# Patient Record
Sex: Female | Born: 1975
Health system: Southern US, Community
[De-identification: ages and names within clinical notes are randomized; demographics above are authoritative.]

## PROBLEM LIST (undated history)

## (undated) DIAGNOSIS — D649 Anemia, unspecified: Secondary | ICD-10-CM

## (undated) DIAGNOSIS — M349 Systemic sclerosis, unspecified: Secondary | ICD-10-CM

## (undated) HISTORY — PX: WISDOM TOOTH EXTRACTION: SHX21

## (undated) HISTORY — PX: BREAST BIOPSY: SHX20

## (undated) HISTORY — DX: Anemia, unspecified: D64.9

---
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
## 2000-06-10 ENCOUNTER — Emergency Department (HOSPITAL_COMMUNITY): Admission: EM | Admit: 2000-06-10 | Discharge: 2000-06-10 | Payer: Self-pay | Admitting: Emergency Medicine

## 2002-12-09 ENCOUNTER — Encounter: Admission: RE | Admit: 2002-12-09 | Discharge: 2002-12-09 | Payer: Self-pay | Admitting: Family Medicine

## 2003-03-01 ENCOUNTER — Other Ambulatory Visit: Admission: RE | Admit: 2003-03-01 | Discharge: 2003-03-01 | Payer: Self-pay | Admitting: Obstetrics & Gynecology

## 2003-03-01 ENCOUNTER — Other Ambulatory Visit: Admission: RE | Admit: 2003-03-01 | Discharge: 2003-03-01 | Payer: Self-pay | Admitting: Obstetrics and Gynecology

## 2004-02-10 ENCOUNTER — Other Ambulatory Visit: Admission: RE | Admit: 2004-02-10 | Discharge: 2004-02-10 | Payer: Self-pay | Admitting: Obstetrics and Gynecology

## 2004-05-23 ENCOUNTER — Encounter: Admission: RE | Admit: 2004-05-23 | Discharge: 2004-07-07 | Payer: Self-pay | Admitting: Internal Medicine

## 2004-07-03 ENCOUNTER — Inpatient Hospital Stay (HOSPITAL_COMMUNITY): Admission: AD | Admit: 2004-07-03 | Discharge: 2004-07-03 | Payer: Self-pay | Admitting: Obstetrics and Gynecology

## 2004-07-07 ENCOUNTER — Ambulatory Visit: Payer: Self-pay | Admitting: Neonatology

## 2004-07-07 ENCOUNTER — Inpatient Hospital Stay (HOSPITAL_COMMUNITY): Admission: AD | Admit: 2004-07-07 | Discharge: 2004-07-16 | Payer: Self-pay | Admitting: Obstetrics and Gynecology

## 2004-07-12 ENCOUNTER — Encounter (INDEPENDENT_AMBULATORY_CARE_PROVIDER_SITE_OTHER): Payer: Self-pay | Admitting: *Deleted

## 2004-07-17 ENCOUNTER — Encounter: Admission: RE | Admit: 2004-07-17 | Discharge: 2004-08-14 | Payer: Self-pay | Admitting: Obstetrics and Gynecology

## 2004-08-03 ENCOUNTER — Encounter: Admission: RE | Admit: 2004-08-03 | Discharge: 2004-08-03 | Payer: Self-pay | Admitting: Internal Medicine

## 2004-08-10 ENCOUNTER — Ambulatory Visit (HOSPITAL_COMMUNITY): Admission: RE | Admit: 2004-08-10 | Discharge: 2004-08-10 | Payer: Self-pay | Admitting: Internal Medicine

## 2004-08-21 ENCOUNTER — Ambulatory Visit (HOSPITAL_COMMUNITY): Admission: RE | Admit: 2004-08-21 | Discharge: 2004-08-21 | Payer: Self-pay | Admitting: Internal Medicine

## 2004-09-05 ENCOUNTER — Encounter: Admission: RE | Admit: 2004-09-05 | Discharge: 2004-12-04 | Payer: Self-pay

## 2004-12-05 ENCOUNTER — Encounter: Admission: RE | Admit: 2004-12-05 | Discharge: 2005-03-05 | Payer: Self-pay

## 2005-03-07 ENCOUNTER — Encounter: Admission: RE | Admit: 2005-03-07 | Discharge: 2005-05-03 | Payer: Self-pay

## 2005-03-15 ENCOUNTER — Other Ambulatory Visit: Admission: RE | Admit: 2005-03-15 | Discharge: 2005-03-15 | Payer: Self-pay | Admitting: Obstetrics and Gynecology

## 2005-05-28 ENCOUNTER — Encounter (INDEPENDENT_AMBULATORY_CARE_PROVIDER_SITE_OTHER): Payer: Self-pay | Admitting: Specialist

## 2005-05-28 ENCOUNTER — Ambulatory Visit (HOSPITAL_COMMUNITY): Admission: RE | Admit: 2005-05-28 | Discharge: 2005-05-28 | Payer: Self-pay | Admitting: Obstetrics and Gynecology

## 2005-10-10 ENCOUNTER — Encounter
Admission: RE | Admit: 2005-10-10 | Discharge: 2006-01-08 | Payer: Self-pay | Admitting: Physical Medicine and Rehabilitation

## 2006-01-22 HISTORY — PX: CYST REMOVAL HAND: SHX6279

## 2006-03-14 ENCOUNTER — Other Ambulatory Visit: Admission: RE | Admit: 2006-03-14 | Discharge: 2006-03-14 | Payer: Self-pay | Admitting: Obstetrics and Gynecology

## 2006-09-25 ENCOUNTER — Other Ambulatory Visit: Admission: RE | Admit: 2006-09-25 | Discharge: 2006-09-25 | Payer: Self-pay | Admitting: Obstetrics and Gynecology

## 2007-03-10 ENCOUNTER — Other Ambulatory Visit: Admission: RE | Admit: 2007-03-10 | Discharge: 2007-03-10 | Payer: Self-pay | Admitting: Obstetrics and Gynecology

## 2007-09-03 ENCOUNTER — Other Ambulatory Visit: Admission: RE | Admit: 2007-09-03 | Discharge: 2007-09-03 | Payer: Self-pay | Admitting: Obstetrics and Gynecology

## 2008-07-21 ENCOUNTER — Other Ambulatory Visit: Admission: RE | Admit: 2008-07-21 | Discharge: 2008-07-21 | Payer: Self-pay | Admitting: Obstetrics and Gynecology

## 2009-09-13 ENCOUNTER — Other Ambulatory Visit: Admission: RE | Admit: 2009-09-13 | Discharge: 2009-09-13 | Payer: Self-pay | Admitting: Obstetrics and Gynecology

## 2009-12-07 ENCOUNTER — Ambulatory Visit: Payer: Self-pay | Admitting: Cardiovascular Disease

## 2009-12-08 ENCOUNTER — Ambulatory Visit: Admission: RE | Admit: 2009-12-08 | Discharge: 2009-12-08 | Payer: Self-pay | Admitting: Internal Medicine

## 2010-02-12 ENCOUNTER — Encounter: Payer: Self-pay | Admitting: Obstetrics and Gynecology

## 2010-03-08 ENCOUNTER — Other Ambulatory Visit: Payer: Self-pay | Admitting: Podiatry

## 2010-03-29 ENCOUNTER — Ambulatory Visit: Payer: Medicare Other | Attending: Internal Medicine | Admitting: Physical Therapy

## 2010-03-29 DIAGNOSIS — IMO0001 Reserved for inherently not codable concepts without codable children: Secondary | ICD-10-CM | POA: Insufficient documentation

## 2010-03-29 DIAGNOSIS — M542 Cervicalgia: Secondary | ICD-10-CM | POA: Insufficient documentation

## 2010-03-29 DIAGNOSIS — M2560 Stiffness of unspecified joint, not elsewhere classified: Secondary | ICD-10-CM | POA: Insufficient documentation

## 2010-03-29 DIAGNOSIS — R293 Abnormal posture: Secondary | ICD-10-CM | POA: Insufficient documentation

## 2010-04-04 ENCOUNTER — Ambulatory Visit: Payer: Medicare Other | Admitting: Physical Therapy

## 2010-04-06 ENCOUNTER — Ambulatory Visit: Payer: Medicare Other | Admitting: Physical Therapy

## 2010-04-10 ENCOUNTER — Ambulatory Visit: Payer: Medicare Other | Admitting: Physical Therapy

## 2010-04-12 ENCOUNTER — Encounter: Payer: Medicare Other | Admitting: Physical Therapy

## 2010-06-06 ENCOUNTER — Ambulatory Visit (HOSPITAL_COMMUNITY)
Admission: RE | Admit: 2010-06-06 | Discharge: 2010-06-06 | Disposition: A | Discharge status: Home / Self Care | Payer: Medicare Other | Source: Ambulatory Visit | Attending: Internal Medicine | Admitting: Internal Medicine

## 2010-06-06 DIAGNOSIS — J984 Other disorders of lung: Secondary | ICD-10-CM | POA: Insufficient documentation

## 2010-06-09 NOTE — H&P (Signed)
NAMEELAIN, WIXON               ACCOUNT NO.:  [id number]   MEDICAL RECORD NO.:  [id number]          PATIENT TYPE:  MAT   LOCATION:  MATC                          [facility]  [facility]   PHYSICIAN:  Charles A. Delcambre, MDDATE OF BIRTH:  Feb 02, 1975   DATE OF ADMISSION:  07/03/2004  DATE OF DISCHARGE:  07/03/2004                                HISTORY & PHYSICAL   HISTORY OF PRESENT ILLNESS:  She is a 35 year old para 0-0-1-0 with [facility]  August 22, 2004 at 33 weeks 3 days. Presented for NST, AFI to Dr.  Lesle Chris office today for antenatal testing as we do not have  ultrasound in our office today. AFI was 15 cm, NST was reactive. She was  having some contractions on the NST. Dr. Sarajane Marek checked her cervix and  she was 3 cm dilated, 50% effaced, -2 station, vertex, intact. For this  reason, she was upon my direction sent to [facility] for admission for  preterm labor. She denied ruptured membranes or bleeding. She did note some  contractions felt and notes active fetal movement.   PAST MEDICAL HISTORY:  1.  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  2.  Chronic anemia.   SURGICAL HISTORY:  None.   MEDICATIONS:  1.  Prednisone 15 mg a day.  2.  Plaquenil 200 mg b.i.d.  3.  Procardia XL 30 mg h.s.  4.  Prenatal vitamins.  5.  Iron.   ALLERGIES:  No known drug allergies.   SOCIAL HISTORY:  No tobacco, ethanol, or drug use. The patient is married.   FAMILY HISTORY:  Noncontributory.   REVIEW OF SYSTEMS:  No chest pain, shortness of breath, wheezing, diarrhea,  constipation.   PHYSICAL EXAMINATION:  GENERAL:  Alert and oriented x3.  VITAL SIGNS:  Blood pressure 128/70 last visit June 14. Weight 206 pounds.  HEENT:  Grossly within normal limits.  NECK:  Supple without thyromegaly or adenopathy.  LUNGS:  Clear bilaterally.  HEART:  Regular rate and rhythm.  ABDOMEN:  Fundal height 33. Fetal heart rate 140s.  HANDS:  Fingers are taut consistent with connective tissue disorder.  EXTREMITIES:  Minimal edema bilaterally.  PELVIC:  As noted in HPI - 3 cm, 50% effaced, -2 station, vertex intact.   ASSESSMENT:  Preterm labor, occult.   PLAN:  Admit to antenatal, group B strep, betamethasone. We will go ahead  and start antibiotics with ampicillin 2 g q.6h. pending strep outcome.  Magnesium prophylaxis at least initially before betamethasone will be given.  All questions were answered and will proceed as outlined.       CAD/MEDQ  D:  07/07/2004  T:  07/07/2004  Job:  [id number]

## 2010-06-09 NOTE — Op Note (Signed)
NAMEPERSIS, GRAFFIUS               ACCOUNT NO.:  [id number]   MEDICAL RECORD NO.:  [id number]          PATIENT TYPE:  AMB   LOCATION:  SDC                           [facility]  [facility]   PHYSICIAN:  Charles A. Delcambre, MDDATE OF BIRTH:  September 04, 1975   DATE OF PROCEDURE:  05/28/2005  DATE OF DISCHARGE:                                 OPERATIVE REPORT   PREOPERATIVE DIAGNOSIS:  High grade dysplasia of the cervix   POSTOPERATIVE DIAGNOSIS:  High grade dysplasia the cervix.   OPERATION PERFORMED:  1.  LEEP.  2.  Paracervical block.   SURGEON:  Charles A. Delcambre, MD   ASSISTANT:  None.   COMPLICATIONS:  None.   ESTIMATED BLOOD LOSS:  Less than or equal to 25 mL.   SPECIMENS:  Looped LEEP cone marked at 12 and six o'clock.   OPERATIVE FINDINGS:  With the colposcope a non-Lugol staining lesion was  noted at the cervix consistent with the preoperative diagnosis.   DESCRIPTION OF THE OPERATION:  The patient was brought to the operating room  and placed in the supine position.  Sedation was accomplished.  The patient  was then placed in the dorsal lithotomy position and Universal stirrups, and  draped for a LEEP procedure.  The colposcope was used and Lugol's was placed  on the cervix.  The lesion was isolated with colposcope.  SmokEvac smoke  evacuator was activated.  The LEEP loop was attached to the Bovie and the  cautery was set at 40 Watts, coagulation and cutting settings.  Using a cut  electrode the cone specimen was excised without difficulty.  Prior to  excision a paracervical block at four and eight o'clock had been placed with  one-quarter percent plain Marcaine infiltrated; a total of 10 mL divided  equally.  The patient had a good block affect; and, with excision the cone  was excised and handed off to go to pathology to be marked after the  procedure.  A good excision was accomplished without difficulty by using the  ball electrode and the cautery on a coagulation  setting of 40 Watts.  The  cone bed was cauterized with good hemostasis resulting.  There was very  little oozing for this reason and Monsel's solution was applied.  Hemostasis  was excellent.   All instruments were removed.   The patient was taken to the recovery room with physician in attendance  having tolerated the procedure well.   The cone bed was marked with pins at 12 and six o'clock, and appropriately  annotated on pathology sheets.      Charles A. Sydnee Cabal, MD  Electronically Signed     CAD/MEDQ  D:  05/28/2005  T:  05/29/2005  Job:  [id number]

## 2010-06-09 NOTE — Discharge Summary (Signed)
[name], Laura Rowland               ACCOUNT NO.:  [id number]   MEDICAL RECORD NO.:  [id number]          PATIENT TYPE:  INP   LOCATION:  9302                          [facility]  [facility]   PHYSICIAN:  Charles A. Delcambre, MDDATE OF BIRTH:  July 13, 1975   DATE OF ADMISSION:  07/07/2004  DATE OF DISCHARGE:  07/16/2004                                 DISCHARGE SUMMARY   PRIMARY DISCHARGE DIAGNOSES:  1.  Intrauterine pregnancy 34 weeks 1 day.  2.  Fetal intolerance of labor.  3.  Maternal connective tissue disease.   PROCEDURE:  Primary low transverse cesarean section.   DISPOSITION:  The patient discharged home to return to the office in 72  hours to discontinue staples.  She was given prescription for Percocet  5/235, 1-2 p.o. q.3-4h. p.r.n. #40, prenatal vitamins 1 p.o. daily, iron 1  tablet p.o. daily.  Convalescence at home and no driving for 2 weeks, no  lifting greater than 30 pounds per day for 1 month and notify of any fever  over 100 degrees or erythema about the incision or drainage from the  incision, heavy bleeding, or increased pain.   LABORATORY DATA:  Postoperative hemoglobin 10.7, hematocrit 31.8.   OPERATIVE FINDINGS:  A vigorous female, Apgars 7 and 9, 1517 g, 37 cm, 3  pounds 5.5 ounces.   HISTORY AND PHYSICAL:  Dictated on the chart.  </   HOSPITAL COURSE:  The patient was admitted, underwent bed rest initially  with magnesium sulfate, tocolysis.  She then underwent betamethasone therapy  and was weaned over to Terbutaline.  She was maintained on Terbutaline, and  neonatal testing was begun and continued through hospital stay.  She was  monitored and continued on Terbutaline up to 34 weeks, at which point  decision was made to deliver, as she had had several larger decelerations  developing to the 70s for several minutes.  With this ongoing and  increasingly frequent decelerations, I recommended that we go on and proceed  with induction with very high risk of  cesarean section.  At the time of her  induction with several contractions, she did have late decelerations.  For  this reason, we proceeded on with cesarean section.  Cesarean section was  accomplished without difficulty.  Postoperatively, she had routine  postoperative course.  Foley catheter was discontinued.  Postop day #1, she  voided without difficulty.  Duramorph gave good pain relief.  She had good  relief thereafter with p.o. Percocet.  She ambulated without difficulty, was  given general diet with spontaneous return of flatus postop day one and was  discharged home on postop day #3 with follow up as noted above.     CAD/MEDQ  D:  08/11/2004  T:  08/12/2004  Job:  [id number]

## 2010-06-09 NOTE — Op Note (Signed)
NAMENANA, VASTINE               ACCOUNT NO.:  [id number]   MEDICAL RECORD NO.:  [id number]          PATIENT TYPE:  INP   LOCATION:  9112                          [facility]   PHYSICIAN:  Charles A. Delcambre, MDDATE OF BIRTH:  Mar 20, 1975   DATE OF PROCEDURE:  07/12/2004  DATE OF DISCHARGE:                                 OPERATIVE REPORT   PREOPERATIVE DIAGNOSES:  1.  Intrauterine pregnancy at 31 weeks and 1 day.  2.  Intrauterine growth restriction.  3.  Nonreassuring fetal heart rate.   POSTOPERATIVE DIAGNOSES:  1.  Intrauterine pregnancy at 31 weeks and 1 day.  2.  Intrauterine growth restriction.  3.  Nonreassuring fetal heart rate.   PROCEDURE:  Primary low transverse cesarean section.   SURGEON:  Charles A. Delcambre, MD.   ASSISTANT:  None.   COMPLICATIONS:  None.   ANESTHESIA:  Spinal.   SPECIMENS:  Placenta to pathology.   ESTIMATED BLOOD LOSS:  500 mL.   FINDINGS:  Vigorous female, Apgar's 7 and 9. Cord, arterial blood gas 7.07,  pH and CO2 25, O2 115, bicarb 6.9, venous blood gas 7.28, CO2 61, O2 13.2,  bicarb 27.2.   COUNTS:  Instrument, sponge and needle count x2.   DESCRIPTION OF PROCEDURE:  The patient was taken to the operating room and  placed in supine position after spinal was induced. Anesthesia was adequate,  sterile prep and drape was undertaken. A Pfannenstiel and staging incision  made with a knife, carried down to fascia. The fascia was incised with a  knife and Mayo scissors. The rectus sheath was released superiorly and  inferiorly. The peritoneum was entered without damage to bowel, bladder or  vascular structures. Metzenbaum scissors were used to extend this incision.  A bladder blade was placed, vesicouterine peritoneum was incised with  Metzenbaum scissors and blunt dissection was used to develop the bladder  flap. The bladder blade was replaced. A lower uterine segment transverse  incision was made with a knife.  Clear  amniotomy fluid was noted, no damage  to the baby was noted. The lower uterine segment was thick. Bandage scissors  were used therefore to extend the incision bilaterally. A hand was inserted,  occiput was brought to the incision site without difficulty. Fundal pressure  was gently placed by the operator assistant and infant was delivered without  difficulty. The cord was clamped, the infant was shown to the parents,  handed off to the neonatologist in attendance. Cord gases and cord blood was  taken. The placenta was manually expressed. The uterus was then closed in  two layers, the first layer in running locking #1 chromic, second layer #1  chromic running imbricating but not locking.  One figure-of-eight #1 chromic  and two figure-of-eight 2-0 Vicryl sutures were used to achieve hemostasis.  Irrigation was carried out, hemostasis was excellent. Bladder flap  hemostasis was excellent. Pericolic gutters were cleansed of clotted blood  and material, irrigation was carried out once again, hemostasis was  verified, subfascial hemostasis was excellent. The fascia was closed with #1  Vicryl running nonlocking sutures, subcutaneous  hemostasis was excellent.  Irrigation was carried out. The skin was closed with sterile skin staples  and sterile dressing was applied. The patient was taken to recovery with  physician in attendance having tolerated the procedure well.       CAD/MEDQ  D:  07/12/2004  T:  07/12/2004  Job:  [id number]

## 2010-06-09 NOTE — H&P (Signed)
Laura Rowland, LONGHI               ACCOUNT NO.:  [id number]   MEDICAL RECORD NO.:  [id number]          PATIENT TYPE:  AMB   LOCATION:  SDC                           [facility]   PHYSICIAN:  Charles A. Delcambre, MDDATE OF BIRTH:  Jun 23, 1975   DATE OF ADMISSION:  DATE OF DISCHARGE:                                HISTORY & PHYSICAL   The patient will be admitted on May 28, 2005 to undergo a LEEP with anxiety  and refusing office LEEP for high grade dysplasia. She is a 35 year old  gravida 2, para 1-0-1-1, amenorrheic, 48-months postpartum.   PAST MEDICAL HISTORY:  Connective tissue disorder.   PAST SURGICAL HISTORY:  Cesarean section. Left breast biopsy, benign.   MEDICATIONS:  1.  Cytoxan, low dose.  2.  Prednisone 50 mg a day.  3.  Procardia 60 mg once a day.  4.  Plaquenil 200 mg b.i.d.  5.  Lupron injection every month, length of treatment not specified.   ALLERGIES:  No known drug allergies.   SOCIAL HISTORY:  Denies tobacco, ethanol or drug use.  STD exposure in the  past. The patient is married in a monogamous relationship with her husband.   FAMILY HISTORY:  Denies family history of breast, uterus, ovary, cervix,  colon cancer; lymphoma; coronary artery disease; stroke; diabetes or  hypertension.   REVIEW OF SYSTEMS:  Birth control method condoms. Denies fevers, chills,  nausea, vomiting, diarrhea, constipation, skin lesions or rashes, headaches,  dizziness. Seasonal allergies are present. No chest pain, shortness of  breath or wheezing. No bleeding per rectum, melena or hematochezia. No  urgency, frequency, dysuria, incontinence or hematuria. No galactorrhea.  No  emotional changes.   PHYSICAL EXAMINATION:  GENERAL:  Alert and oriented x3, no distress.  VITAL SIGNS:  Blood pressure 120/70, respirations 18, heart rate 80, weight  201 pounds.  HEENT:  Grossly within normal limits.  NECK:  Supple without thyromegaly or adenopathy.  LUNGS:  Clear bilaterally.  BREASTS:  No masses, tenderness, discharge, skin or nipple changes  bilaterally.  ABDOMEN:  Soft, nontender, nondistended. No hepatosplenomegaly or masses  noted. No hernia.  PELVIC:  Normal external female genitalia. Bartholin's, urethral and Skene's  within normal limits. Urethral meatus normal. Urethra normal. Bladder  normal. Vagina normal. Multiparous cervix noted. Cervix is well healed from  biopsies done approximately six weeks ago without discharge or lesions. No  cervical motion tenderness is present. Bimanual examination:  Uterus is mid  plane, 8 weeks' size, adnexa nontender without masses bilaterally. Ovaries  palpably normal size bilaterally.  RECTAL:  Not done. Anus to perineal body appeared normal.   ASSESSMENT:  High-grade dysplasia.   PLAN:  LEEP therapy at her request at the hospital. All questions are  answered. She accepts risks of infection, bleeding, bowel and bladder  damaged, failed LEEP. All questions are answered. She will remain n.p.o.  past midnight the evening prior to surgery. Preoperative CMET, serum  pregnancy, CBC will be done preoperatively. An appointment will be scheduled  with anesthesia prior to the procedure. She will remain n.p.o. past midnight  the evening  prior to procedure. We will procedure with surgery scheduled as  a 7:30 case and proceed as outlined.      Charles A. Sydnee Cabal, MD  Electronically Signed     CAD/MEDQ  D:  05/15/2005  T:  05/15/2005  Job:  [id number]

## 2010-09-19 ENCOUNTER — Other Ambulatory Visit (HOSPITAL_COMMUNITY)
Admission: RE | Admit: 2010-09-19 | Discharge: 2010-09-19 | Disposition: A | Discharge status: Home / Self Care | Payer: Medicare Other | Source: Ambulatory Visit | Attending: Obstetrics and Gynecology | Admitting: Obstetrics and Gynecology

## 2010-09-19 ENCOUNTER — Other Ambulatory Visit: Payer: Self-pay | Admitting: Nurse Practitioner

## 2010-09-19 DIAGNOSIS — Z113 Encounter for screening for infections with a predominantly sexual mode of transmission: Secondary | ICD-10-CM | POA: Insufficient documentation

## 2010-09-19 DIAGNOSIS — Z124 Encounter for screening for malignant neoplasm of cervix: Secondary | ICD-10-CM | POA: Insufficient documentation

## 2011-01-25 DIAGNOSIS — R1032 Left lower quadrant pain: Secondary | ICD-10-CM | POA: Diagnosis not present

## 2011-01-25 DIAGNOSIS — N83209 Unspecified ovarian cyst, unspecified side: Secondary | ICD-10-CM | POA: Diagnosis not present

## 2011-02-22 DIAGNOSIS — N83209 Unspecified ovarian cyst, unspecified side: Secondary | ICD-10-CM | POA: Diagnosis not present

## 2011-03-07 DIAGNOSIS — Z79899 Other long term (current) drug therapy: Secondary | ICD-10-CM | POA: Diagnosis not present

## 2011-03-07 DIAGNOSIS — M349 Systemic sclerosis, unspecified: Secondary | ICD-10-CM | POA: Diagnosis not present

## 2011-03-20 DIAGNOSIS — N83209 Unspecified ovarian cyst, unspecified side: Secondary | ICD-10-CM | POA: Diagnosis not present

## 2011-04-09 DIAGNOSIS — B009 Herpesviral infection, unspecified: Secondary | ICD-10-CM | POA: Diagnosis not present

## 2011-04-09 DIAGNOSIS — K219 Gastro-esophageal reflux disease without esophagitis: Secondary | ICD-10-CM | POA: Diagnosis not present

## 2011-04-09 DIAGNOSIS — D649 Anemia, unspecified: Secondary | ICD-10-CM | POA: Diagnosis not present

## 2011-04-09 DIAGNOSIS — Z79899 Other long term (current) drug therapy: Secondary | ICD-10-CM | POA: Diagnosis not present

## 2011-04-09 DIAGNOSIS — I10 Essential (primary) hypertension: Secondary | ICD-10-CM | POA: Diagnosis not present

## 2011-05-23 ENCOUNTER — Other Ambulatory Visit (HOSPITAL_COMMUNITY): Payer: Self-pay | Admitting: Internal Medicine

## 2011-05-23 DIAGNOSIS — J984 Other disorders of lung: Secondary | ICD-10-CM

## 2011-05-31 ENCOUNTER — Ambulatory Visit (HOSPITAL_COMMUNITY)
Admission: RE | Admit: 2011-05-31 | Discharge: 2011-05-31 | Disposition: A | Discharge status: Home / Self Care | Payer: Medicare Other | Source: Ambulatory Visit | Attending: Internal Medicine | Admitting: Internal Medicine

## 2011-05-31 ENCOUNTER — Ambulatory Visit (HOSPITAL_COMMUNITY): Admission: RE | Admit: 2011-05-31 | Payer: Medicare Other | Source: Ambulatory Visit

## 2011-05-31 DIAGNOSIS — J984 Other disorders of lung: Secondary | ICD-10-CM | POA: Insufficient documentation

## 2011-06-14 DIAGNOSIS — H04129 Dry eye syndrome of unspecified lacrimal gland: Secondary | ICD-10-CM | POA: Diagnosis not present

## 2011-06-14 DIAGNOSIS — H40019 Open angle with borderline findings, low risk, unspecified eye: Secondary | ICD-10-CM | POA: Diagnosis not present

## 2011-06-14 DIAGNOSIS — H1045 Other chronic allergic conjunctivitis: Secondary | ICD-10-CM | POA: Diagnosis not present

## 2011-06-14 DIAGNOSIS — H5210 Myopia, unspecified eye: Secondary | ICD-10-CM | POA: Diagnosis not present

## 2011-06-27 ENCOUNTER — Ambulatory Visit (HOSPITAL_COMMUNITY)
Admission: RE | Admit: 2011-06-27 | Discharge: 2011-06-27 | Disposition: A | Discharge status: Home / Self Care | Payer: Medicare Other | Source: Ambulatory Visit | Attending: Internal Medicine | Admitting: Internal Medicine

## 2011-06-27 DIAGNOSIS — I311 Chronic constrictive pericarditis: Secondary | ICD-10-CM | POA: Insufficient documentation

## 2011-06-27 DIAGNOSIS — M349 Systemic sclerosis, unspecified: Secondary | ICD-10-CM | POA: Diagnosis not present

## 2011-06-27 DIAGNOSIS — I2721 Secondary pulmonary arterial hypertension: Secondary | ICD-10-CM

## 2011-06-27 DIAGNOSIS — I517 Cardiomegaly: Secondary | ICD-10-CM

## 2011-06-27 NOTE — Progress Notes (Signed)
  Echocardiogram 2D Echocardiogram has been performed.  Shannie Kontos, Real Cons 06/27/2011, 11:16 AM

## 2011-08-06 DIAGNOSIS — H8110 Benign paroxysmal vertigo, unspecified ear: Secondary | ICD-10-CM | POA: Diagnosis not present

## 2011-09-20 ENCOUNTER — Other Ambulatory Visit (HOSPITAL_COMMUNITY)
Admission: RE | Admit: 2011-09-20 | Discharge: 2011-09-20 | Disposition: A | Discharge status: Home / Self Care | Payer: Medicare Other | Source: Ambulatory Visit | Attending: Obstetrics and Gynecology | Admitting: Obstetrics and Gynecology

## 2011-09-20 ENCOUNTER — Other Ambulatory Visit: Payer: Self-pay | Admitting: Nurse Practitioner

## 2011-09-20 ENCOUNTER — Other Ambulatory Visit: Payer: Self-pay | Admitting: Obstetrics and Gynecology

## 2011-09-20 DIAGNOSIS — N83209 Unspecified ovarian cyst, unspecified side: Secondary | ICD-10-CM | POA: Diagnosis not present

## 2011-09-20 DIAGNOSIS — Z01419 Encounter for gynecological examination (general) (routine) without abnormal findings: Secondary | ICD-10-CM | POA: Diagnosis not present

## 2011-09-20 DIAGNOSIS — N6009 Solitary cyst of unspecified breast: Secondary | ICD-10-CM | POA: Diagnosis not present

## 2011-09-20 DIAGNOSIS — N760 Acute vaginitis: Secondary | ICD-10-CM | POA: Insufficient documentation

## 2011-09-20 DIAGNOSIS — Z309 Encounter for contraceptive management, unspecified: Secondary | ICD-10-CM | POA: Diagnosis not present

## 2011-09-26 ENCOUNTER — Other Ambulatory Visit: Payer: Self-pay | Admitting: Obstetrics and Gynecology

## 2011-09-26 ENCOUNTER — Ambulatory Visit
Admission: RE | Admit: 2011-09-26 | Discharge: 2011-09-26 | Disposition: A | Discharge status: Home / Self Care | Payer: Medicare Other | Source: Ambulatory Visit | Attending: Obstetrics and Gynecology | Admitting: Obstetrics and Gynecology

## 2011-09-26 DIAGNOSIS — N6009 Solitary cyst of unspecified breast: Secondary | ICD-10-CM

## 2011-09-26 DIAGNOSIS — N63 Unspecified lump in unspecified breast: Secondary | ICD-10-CM

## 2011-10-03 ENCOUNTER — Other Ambulatory Visit: Payer: Medicare Other

## 2011-10-04 DIAGNOSIS — I7300 Raynaud's syndrome without gangrene: Secondary | ICD-10-CM | POA: Insufficient documentation

## 2011-10-04 DIAGNOSIS — M349 Systemic sclerosis, unspecified: Secondary | ICD-10-CM | POA: Diagnosis not present

## 2011-10-04 DIAGNOSIS — K224 Dyskinesia of esophagus: Secondary | ICD-10-CM | POA: Insufficient documentation

## 2011-10-09 ENCOUNTER — Ambulatory Visit
Admission: RE | Admit: 2011-10-09 | Discharge: 2011-10-09 | Disposition: A | Discharge status: Home / Self Care | Payer: Medicare Other | Source: Ambulatory Visit | Attending: Obstetrics and Gynecology | Admitting: Obstetrics and Gynecology

## 2011-10-09 DIAGNOSIS — N63 Unspecified lump in unspecified breast: Secondary | ICD-10-CM

## 2011-10-09 DIAGNOSIS — D249 Benign neoplasm of unspecified breast: Secondary | ICD-10-CM | POA: Diagnosis not present

## 2011-10-11 DIAGNOSIS — Z0000 Encounter for general adult medical examination without abnormal findings: Secondary | ICD-10-CM | POA: Diagnosis not present

## 2011-10-11 DIAGNOSIS — Z79899 Other long term (current) drug therapy: Secondary | ICD-10-CM | POA: Diagnosis not present

## 2011-10-11 DIAGNOSIS — Z136 Encounter for screening for cardiovascular disorders: Secondary | ICD-10-CM | POA: Diagnosis not present

## 2011-10-11 DIAGNOSIS — Z13220 Encounter for screening for lipoid disorders: Secondary | ICD-10-CM | POA: Diagnosis not present

## 2011-10-25 DIAGNOSIS — M3500 Sicca syndrome, unspecified: Secondary | ICD-10-CM | POA: Diagnosis not present

## 2011-10-25 DIAGNOSIS — H40019 Open angle with borderline findings, low risk, unspecified eye: Secondary | ICD-10-CM | POA: Diagnosis not present

## 2011-10-25 DIAGNOSIS — Z79899 Other long term (current) drug therapy: Secondary | ICD-10-CM | POA: Diagnosis not present

## 2011-11-01 DIAGNOSIS — J209 Acute bronchitis, unspecified: Secondary | ICD-10-CM | POA: Diagnosis not present

## 2011-11-23 DIAGNOSIS — M349 Systemic sclerosis, unspecified: Secondary | ICD-10-CM | POA: Diagnosis not present

## 2011-11-23 DIAGNOSIS — I509 Heart failure, unspecified: Secondary | ICD-10-CM | POA: Diagnosis not present

## 2011-11-23 DIAGNOSIS — L988 Other specified disorders of the skin and subcutaneous tissue: Secondary | ICD-10-CM | POA: Diagnosis not present

## 2011-11-23 DIAGNOSIS — L98499 Non-pressure chronic ulcer of skin of other sites with unspecified severity: Secondary | ICD-10-CM | POA: Diagnosis not present

## 2011-11-23 DIAGNOSIS — K224 Dyskinesia of esophagus: Secondary | ICD-10-CM | POA: Diagnosis not present

## 2011-11-23 DIAGNOSIS — I7300 Raynaud's syndrome without gangrene: Secondary | ICD-10-CM | POA: Diagnosis not present

## 2012-01-01 DIAGNOSIS — Z30433 Encounter for removal and reinsertion of intrauterine contraceptive device: Secondary | ICD-10-CM | POA: Diagnosis not present

## 2012-02-12 DIAGNOSIS — N760 Acute vaginitis: Secondary | ICD-10-CM | POA: Diagnosis not present

## 2012-02-12 DIAGNOSIS — Z309 Encounter for contraceptive management, unspecified: Secondary | ICD-10-CM | POA: Diagnosis not present

## 2012-02-22 DIAGNOSIS — L98499 Non-pressure chronic ulcer of skin of other sites with unspecified severity: Secondary | ICD-10-CM | POA: Diagnosis not present

## 2012-02-22 DIAGNOSIS — D518 Other vitamin B12 deficiency anemias: Secondary | ICD-10-CM | POA: Diagnosis not present

## 2012-02-22 DIAGNOSIS — K224 Dyskinesia of esophagus: Secondary | ICD-10-CM | POA: Diagnosis not present

## 2012-02-22 DIAGNOSIS — I7300 Raynaud's syndrome without gangrene: Secondary | ICD-10-CM | POA: Diagnosis not present

## 2012-02-22 DIAGNOSIS — L988 Other specified disorders of the skin and subcutaneous tissue: Secondary | ICD-10-CM | POA: Diagnosis not present

## 2012-02-22 DIAGNOSIS — R0602 Shortness of breath: Secondary | ICD-10-CM | POA: Diagnosis not present

## 2012-02-22 DIAGNOSIS — Z79899 Other long term (current) drug therapy: Secondary | ICD-10-CM | POA: Diagnosis not present

## 2012-02-22 DIAGNOSIS — M349 Systemic sclerosis, unspecified: Secondary | ICD-10-CM | POA: Diagnosis not present

## 2012-02-28 DIAGNOSIS — R059 Cough, unspecified: Secondary | ICD-10-CM | POA: Diagnosis not present

## 2012-03-03 ENCOUNTER — Other Ambulatory Visit: Payer: Self-pay | Admitting: Obstetrics and Gynecology

## 2012-03-03 DIAGNOSIS — N63 Unspecified lump in unspecified breast: Secondary | ICD-10-CM

## 2012-03-26 DIAGNOSIS — H9209 Otalgia, unspecified ear: Secondary | ICD-10-CM | POA: Diagnosis not present

## 2012-03-27 ENCOUNTER — Ambulatory Visit
Admission: RE | Admit: 2012-03-27 | Discharge: 2012-03-27 | Disposition: A | Discharge status: Home / Self Care | Payer: Medicare Other | Source: Ambulatory Visit | Attending: Obstetrics and Gynecology | Admitting: Obstetrics and Gynecology

## 2012-03-27 DIAGNOSIS — N63 Unspecified lump in unspecified breast: Secondary | ICD-10-CM | POA: Diagnosis not present

## 2012-04-07 DIAGNOSIS — E559 Vitamin D deficiency, unspecified: Secondary | ICD-10-CM | POA: Diagnosis not present

## 2012-04-10 DIAGNOSIS — D649 Anemia, unspecified: Secondary | ICD-10-CM | POA: Diagnosis not present

## 2012-04-10 DIAGNOSIS — I10 Essential (primary) hypertension: Secondary | ICD-10-CM | POA: Diagnosis not present

## 2012-04-10 DIAGNOSIS — Z79899 Other long term (current) drug therapy: Secondary | ICD-10-CM | POA: Diagnosis not present

## 2012-04-10 DIAGNOSIS — K219 Gastro-esophageal reflux disease without esophagitis: Secondary | ICD-10-CM | POA: Diagnosis not present

## 2012-05-22 ENCOUNTER — Other Ambulatory Visit (HOSPITAL_COMMUNITY): Payer: Self-pay | Admitting: Internal Medicine

## 2012-05-22 DIAGNOSIS — J8410 Pulmonary fibrosis, unspecified: Secondary | ICD-10-CM

## 2012-06-04 ENCOUNTER — Inpatient Hospital Stay (HOSPITAL_COMMUNITY)
Admission: RE | Admit: 2012-06-04 | Discharge: 2012-06-04 | Disposition: A | Discharge status: Home / Self Care | Payer: Medicare Other | Source: Ambulatory Visit

## 2012-06-04 ENCOUNTER — Inpatient Hospital Stay (HOSPITAL_COMMUNITY)
Admission: RE | Admit: 2012-06-04 | Discharge: 2012-06-04 | Disposition: A | Discharge status: Home / Self Care | Payer: Medicare Other | Source: Ambulatory Visit | Attending: Internal Medicine | Admitting: Internal Medicine

## 2012-06-04 ENCOUNTER — Other Ambulatory Visit (HOSPITAL_COMMUNITY): Payer: Self-pay | Admitting: Internal Medicine

## 2012-06-04 ENCOUNTER — Ambulatory Visit (HOSPITAL_COMMUNITY)
Admission: RE | Admit: 2012-06-04 | Discharge: 2012-06-04 | Disposition: A | Discharge status: Home / Self Care | Payer: Medicare Other | Source: Ambulatory Visit | Attending: Internal Medicine | Admitting: Internal Medicine

## 2012-06-04 DIAGNOSIS — I7300 Raynaud's syndrome without gangrene: Secondary | ICD-10-CM | POA: Diagnosis not present

## 2012-06-04 DIAGNOSIS — I059 Rheumatic mitral valve disease, unspecified: Secondary | ICD-10-CM | POA: Diagnosis not present

## 2012-06-04 DIAGNOSIS — I079 Rheumatic tricuspid valve disease, unspecified: Secondary | ICD-10-CM | POA: Insufficient documentation

## 2012-06-04 DIAGNOSIS — L942 Calcinosis cutis: Secondary | ICD-10-CM

## 2012-06-04 DIAGNOSIS — J8410 Pulmonary fibrosis, unspecified: Secondary | ICD-10-CM | POA: Diagnosis not present

## 2012-06-04 MED ORDER — ALBUTEROL SULFATE (5 MG/ML) 0.5% IN NEBU
2.5000 mg | INHALATION_SOLUTION | Freq: Once | RESPIRATORY_TRACT | Status: AC
  Administered 2012-06-04: 2.5 mg via RESPIRATORY_TRACT

## 2012-06-09 DIAGNOSIS — R300 Dysuria: Secondary | ICD-10-CM | POA: Diagnosis not present

## 2012-06-18 DIAGNOSIS — K224 Dyskinesia of esophagus: Secondary | ICD-10-CM | POA: Diagnosis not present

## 2012-06-18 DIAGNOSIS — J8410 Pulmonary fibrosis, unspecified: Secondary | ICD-10-CM | POA: Diagnosis not present

## 2012-06-18 DIAGNOSIS — L98499 Non-pressure chronic ulcer of skin of other sites with unspecified severity: Secondary | ICD-10-CM | POA: Diagnosis not present

## 2012-06-18 DIAGNOSIS — M349 Systemic sclerosis, unspecified: Secondary | ICD-10-CM | POA: Diagnosis not present

## 2012-06-18 DIAGNOSIS — Z79899 Other long term (current) drug therapy: Secondary | ICD-10-CM | POA: Diagnosis not present

## 2012-06-18 DIAGNOSIS — L988 Other specified disorders of the skin and subcutaneous tissue: Secondary | ICD-10-CM | POA: Diagnosis not present

## 2012-06-18 DIAGNOSIS — I2789 Other specified pulmonary heart diseases: Secondary | ICD-10-CM | POA: Diagnosis not present

## 2012-06-18 DIAGNOSIS — I7300 Raynaud's syndrome without gangrene: Secondary | ICD-10-CM | POA: Diagnosis not present

## 2012-06-18 DIAGNOSIS — R0602 Shortness of breath: Secondary | ICD-10-CM | POA: Diagnosis not present

## 2012-09-09 DIAGNOSIS — L98499 Non-pressure chronic ulcer of skin of other sites with unspecified severity: Secondary | ICD-10-CM | POA: Diagnosis not present

## 2012-09-09 DIAGNOSIS — L988 Other specified disorders of the skin and subcutaneous tissue: Secondary | ICD-10-CM | POA: Diagnosis not present

## 2012-09-09 DIAGNOSIS — M349 Systemic sclerosis, unspecified: Secondary | ICD-10-CM | POA: Diagnosis not present

## 2012-09-09 DIAGNOSIS — K224 Dyskinesia of esophagus: Secondary | ICD-10-CM | POA: Diagnosis not present

## 2012-09-09 DIAGNOSIS — J8410 Pulmonary fibrosis, unspecified: Secondary | ICD-10-CM | POA: Diagnosis not present

## 2012-09-09 DIAGNOSIS — I7300 Raynaud's syndrome without gangrene: Secondary | ICD-10-CM | POA: Diagnosis not present

## 2012-09-09 DIAGNOSIS — Z79899 Other long term (current) drug therapy: Secondary | ICD-10-CM | POA: Diagnosis not present

## 2012-09-09 DIAGNOSIS — R0602 Shortness of breath: Secondary | ICD-10-CM | POA: Diagnosis not present

## 2012-09-16 ENCOUNTER — Other Ambulatory Visit: Payer: Self-pay | Admitting: Obstetrics and Gynecology

## 2012-09-16 DIAGNOSIS — D249 Benign neoplasm of unspecified breast: Secondary | ICD-10-CM

## 2012-09-17 ENCOUNTER — Encounter: Payer: Self-pay | Admitting: Vascular Surgery

## 2012-09-18 ENCOUNTER — Encounter (INDEPENDENT_AMBULATORY_CARE_PROVIDER_SITE_OTHER): Payer: Medicare Other | Admitting: *Deleted

## 2012-09-18 ENCOUNTER — Encounter: Payer: Self-pay | Admitting: Vascular Surgery

## 2012-09-18 ENCOUNTER — Ambulatory Visit (INDEPENDENT_AMBULATORY_CARE_PROVIDER_SITE_OTHER): Payer: Medicare Other | Admitting: Vascular Surgery

## 2012-09-18 DIAGNOSIS — I83893 Varicose veins of bilateral lower extremities with other complications: Secondary | ICD-10-CM | POA: Diagnosis not present

## 2012-09-18 NOTE — Progress Notes (Signed)
[facility] [place] HISTORY AND PHYSICAL   History of Present Illness:  Patient is a 37 y.o. year old female who presents for evaluation of painful varicose veins with leg swelling. The patient states that she has had several years of progressive leg swelling. The leg swelling worsens as the day goes on. She is up on her feet during the day caring for her children. She states that at the end of the day her legs are 18. This improved with ibuprofen and leg elevation overnight. She denies any prior lower extremity injuries. She has no prior history of DVT. She's had no prior surgical procedures on her lower extremities. She does have a history of scleroderma but this is in remission. She never really had any significant problems with tissue loss ulcers or arterial occlusive disease. She is on Plavix 1L and Pletal for this. She does wear compression stockings intermittently if she is one to take a long trip or on an airplane. Other medical problems include anemia which is controlled.  Past Medical History  Diagnosis Date  . Anemia     Past Surgical History  Procedure Laterality Date  . Cesarean section    . Breast cyst excision  1996  . Cyst removal hand  2008     Social History History  Substance Use Topics  . Smoking status: Never Smoker   . Smokeless tobacco: Never Used  . Alcohol Use: No     Comment: rarely    Family History Family History  Problem Relation Age of Onset  . Diabetes Father     Allergies  No Known Allergies   Current Outpatient Prescriptions  Medication Sig Dispense Refill  . Cholecalciferol (VITAMIN D) 2000 UNITS tablet Take 2,000 Units by mouth daily.      . cilostazol (PLETAL) 50 MG tablet Take 50 mg by mouth 2 (two) times daily.      Marland Kitchen esomeprazole (NEXIUM) 40 MG capsule Take 40 mg by mouth 2 (two) times daily.      . Hydroxychloroquine Sulfate (PLAQUENIL PO) Take 20 mg by mouth 3 (three) times daily.      Marland Kitchen lisinopril  (PRINIVIL,ZESTRIL) 10 MG tablet Take 10 mg by mouth daily.      . metoCLOPramide (REGLAN) 5 MG tablet Take 5 mg by mouth daily.      . Omega-3 Fatty Acids (FISH OIL) 1000 MG CAPS Take 1 capsule by mouth daily.      . vitamin E 200 UNIT capsule Take 200 Units by mouth daily.       No current facility-administered medications for this visit.    ROS:   General:  No weight loss, Fever, chills  HEENT: No recent headaches, no nasal bleeding, no visual changes, no sore throat  Neurologic: No dizziness, blackouts, seizures. No recent symptoms of stroke or mini- stroke. No recent episodes of slurred speech, or temporary blindness.  Cardiac: No recent episodes of chest pain/pressure, no shortness of breath at rest.  No shortness of breath with exertion.  Denies history of atrial fibrillation or irregular heartbeat  Vascular: No history of rest pain in feet.  No history of claudication.  No history of non-healing ulcer, No history of DVT   Pulmonary: No home oxygen, no productive cough, no hemoptysis,  No asthma or wheezing  Musculoskeletal:  [ ]  Arthritis, [ ]  Low back pain,  [ ]  Joint pain  Hematologic:No history of hypercoagulable state.  No history of easy bleeding.  + history of  anemia  Gastrointestinal: No hematochezia or melena,  No gastroesophageal reflux, no trouble swallowing  Urinary: [ ]  chronic Kidney disease, [ ]  on HD - [ ]  MWF or [ ]  TTHS, [ ]  Burning with urination, [ ]  Frequent urination, [ ]  Difficulty urinating;   Skin: No rashes  Psychological: No history of anxiety,  No history of depression   Physical Examination  Vitals: HR 80 R 16 T 98 BP 130/83 General:  Alert and oriented, no acute distress HEENT: Normal Neck: No bruit or JVD Pulmonary: Clear to auscultation bilaterally Cardiac: Regular Rate and Rhythm without murmur Abdomen: Soft, non-tender, non-distended, no mass, slightly obese Skin: No rash, multiple clusters of medial calf varicosities which are  fairly symmetric in both legs. These are 4-6 mm in diameter. She also has scattered reticular type pains in the right posterior knee, no ulcerations Extremity Pulses:  2+ radial, brachial, femoral, dorsalis pedis pulses bilaterally Musculoskeletal: No deformity trace edema  Neurologic: Upper and lower extremity motor 5/5 and symmetric  DATA: Patient had a venous reflux exam today which I reviewed and interpreted. This showed greater than 500 m/s reflux in both lower extremities. She had evidence of deep and superficial reflux. The greater saphenous vein was 6-8 mm in diameter on the right 4-6 mm in diameter on the left   ASSESSMENT: Bilateral symptomatic superficial and deep venous reflux with varicose veins. Pathophysiology of superficial and deep venous reflux was discussed with the patient today.   PLAN:  Bilateral compression stockings for her current symptoms. She'll continue to elevate her legs as well as use ibuprofen intermittently. She will followup in 3 months time with Dr. Arbie Cookey to consider whether or not she wishes to have laser ablation.  Fabienne Bruns, MD [facility] [place] Office: [phone] Pager: [phone]

## 2012-09-23 ENCOUNTER — Ambulatory Visit
Admission: RE | Admit: 2012-09-23 | Discharge: 2012-09-23 | Disposition: A | Discharge status: Home / Self Care | Payer: Medicare Other | Source: Ambulatory Visit | Attending: Obstetrics and Gynecology | Admitting: Obstetrics and Gynecology

## 2012-09-23 DIAGNOSIS — D249 Benign neoplasm of unspecified breast: Secondary | ICD-10-CM | POA: Diagnosis not present

## 2012-09-24 ENCOUNTER — Other Ambulatory Visit (HOSPITAL_COMMUNITY)
Admission: RE | Admit: 2012-09-24 | Discharge: 2012-09-24 | Disposition: A | Discharge status: Home / Self Care | Payer: Medicare Other | Source: Ambulatory Visit | Attending: Obstetrics and Gynecology | Admitting: Obstetrics and Gynecology

## 2012-09-24 ENCOUNTER — Other Ambulatory Visit: Payer: Self-pay | Admitting: Obstetrics and Gynecology

## 2012-09-24 ENCOUNTER — Other Ambulatory Visit: Payer: Self-pay | Admitting: *Deleted

## 2012-09-24 DIAGNOSIS — Z01419 Encounter for gynecological examination (general) (routine) without abnormal findings: Secondary | ICD-10-CM | POA: Diagnosis not present

## 2012-09-24 DIAGNOSIS — Z1151 Encounter for screening for human papillomavirus (HPV): Secondary | ICD-10-CM | POA: Insufficient documentation

## 2012-09-24 DIAGNOSIS — I83893 Varicose veins of bilateral lower extremities with other complications: Secondary | ICD-10-CM

## 2012-09-24 DIAGNOSIS — Z30431 Encounter for routine checking of intrauterine contraceptive device: Secondary | ICD-10-CM | POA: Diagnosis not present

## 2012-10-16 DIAGNOSIS — R5381 Other malaise: Secondary | ICD-10-CM | POA: Diagnosis not present

## 2012-10-16 DIAGNOSIS — I10 Essential (primary) hypertension: Secondary | ICD-10-CM | POA: Diagnosis not present

## 2012-10-16 DIAGNOSIS — K219 Gastro-esophageal reflux disease without esophagitis: Secondary | ICD-10-CM | POA: Diagnosis not present

## 2012-10-16 DIAGNOSIS — Z0000 Encounter for general adult medical examination without abnormal findings: Secondary | ICD-10-CM | POA: Diagnosis not present

## 2012-10-16 DIAGNOSIS — Z23 Encounter for immunization: Secondary | ICD-10-CM | POA: Diagnosis not present

## 2012-10-16 DIAGNOSIS — Z136 Encounter for screening for cardiovascular disorders: Secondary | ICD-10-CM | POA: Diagnosis not present

## 2012-10-31 DIAGNOSIS — Z30432 Encounter for removal of intrauterine contraceptive device: Secondary | ICD-10-CM | POA: Diagnosis not present

## 2012-11-03 ENCOUNTER — Encounter (HOSPITAL_COMMUNITY): Payer: Self-pay | Admitting: Emergency Medicine

## 2012-11-03 ENCOUNTER — Emergency Department (HOSPITAL_COMMUNITY): Payer: Medicare Other

## 2012-11-03 ENCOUNTER — Emergency Department (HOSPITAL_COMMUNITY)
Admission: EM | Admit: 2012-11-03 | Discharge: 2012-11-03 | Disposition: A | Discharge status: Home / Self Care | Payer: Medicare Other | Attending: Emergency Medicine | Admitting: Emergency Medicine

## 2012-11-03 DIAGNOSIS — R0601 Orthopnea: Secondary | ICD-10-CM | POA: Insufficient documentation

## 2012-11-03 DIAGNOSIS — Z862 Personal history of diseases of the blood and blood-forming organs and certain disorders involving the immune mechanism: Secondary | ICD-10-CM | POA: Diagnosis not present

## 2012-11-03 DIAGNOSIS — R0602 Shortness of breath: Secondary | ICD-10-CM | POA: Diagnosis not present

## 2012-11-03 DIAGNOSIS — Z3202 Encounter for pregnancy test, result negative: Secondary | ICD-10-CM | POA: Insufficient documentation

## 2012-11-03 DIAGNOSIS — J984 Other disorders of lung: Secondary | ICD-10-CM | POA: Diagnosis not present

## 2012-11-03 DIAGNOSIS — Z8739 Personal history of other diseases of the musculoskeletal system and connective tissue: Secondary | ICD-10-CM | POA: Diagnosis not present

## 2012-11-03 DIAGNOSIS — Z79899 Other long term (current) drug therapy: Secondary | ICD-10-CM | POA: Diagnosis not present

## 2012-11-03 DIAGNOSIS — R071 Chest pain on breathing: Secondary | ICD-10-CM | POA: Insufficient documentation

## 2012-11-03 DIAGNOSIS — R0789 Other chest pain: Secondary | ICD-10-CM

## 2012-11-03 HISTORY — DX: Systemic sclerosis, unspecified: M34.9

## 2012-11-03 LAB — BASIC METABOLIC PANEL
BUN: 10 mg/dL (ref 6–23)
Chloride: 100 mEq/L (ref 96–112)
Creatinine, Ser: 0.78 mg/dL (ref 0.50–1.10)
GFR calc non Af Amer: >90 mL/min (ref >90)
Glucose, Bld: 92 mg/dL (ref 70–99)
Potassium: 4.3 mEq/L (ref 3.5–5.1)

## 2012-11-03 LAB — CBC
HCT: 39 % (ref 36.0–46.0)
Hemoglobin: 12.6 g/dL (ref 12.0–15.0)
MCHC: 32.3 g/dL (ref 30.0–36.0)
MCV: 78.5 fL (ref 78.0–100.0)

## 2012-11-03 LAB — POCT I-STAT TROPONIN I: Troponin i, poc: 0 ng/mL (ref 0.00–0.08)

## 2012-11-03 LAB — POCT PREGNANCY, URINE: Preg Test, Ur: NEGATIVE

## 2012-11-03 MED ORDER — IBUPROFEN 800 MG PO TABS
800.0000 mg | ORAL_TABLET | Freq: Once | ORAL | Status: AC
  Administered 2012-11-03: 800 mg via ORAL
  Filled 2012-11-03: qty 1

## 2012-11-03 MED ORDER — IBUPROFEN 800 MG PO TABS: 800.0000 mg | ORAL_TABLET | Freq: Three times a day (TID) | ORAL | Status: DC | PRN

## 2012-11-03 NOTE — ED Provider Notes (Signed)
Complains of pleuritic right-sided parasternal nonradiating chest pain onset 4 days ago. Pain constant. Worse with changing positions or deep inspiration on exam no distress lungs clear auscultation heart regular rate and rhythm no murmurs rubs chest is tender right sided parasternal area pain is reproducible by forcible abduction of right shoulder.  Doug Sou, MD 11/03/12 2024

## 2012-11-03 NOTE — ED Provider Notes (Signed)
CSN: [id number]     Arrival date & time 11/03/12  1754 History   First MD Initiated Contact with Patient 11/03/12 1940     Chief Complaint  Patient presents with  . Chest Pain  . Shortness of Breath   (Consider location/radiation/quality/duration/timing/severity/associated sxs/prior Treatment) Patient is a 37 y.o. female presenting with chest pain. The history is provided by the patient.  Chest Pain Pain location:  R chest Pain quality: sharp   Pain radiates to:  Precordial region Pain radiates to the back: yes   Pain severity:  Moderate Onset quality:  Gradual Timing:  Constant Progression:  Worsening Chronicity:  New Context: breathing and at rest   Relieved by:  Nothing Worsened by:  Deep breathing, movement and certain positions Associated symptoms: orthopnea and shortness of breath   Associated symptoms: no abdominal pain, no anorexia, no anxiety, no back pain, no cough, no fever, no headache, no heartburn, no nausea, no near-syncope, no numbness, no syncope, not vomiting and no weakness   Risk factors: no coronary artery disease, no diabetes mellitus and no hypertension     Past Medical History  Diagnosis Date  . Anemia   . Scleredema    Past Surgical History  Procedure Laterality Date  . Cesarean section    . Breast cyst excision  1996  . Cyst removal hand  2008   Family History  Problem Relation Age of Onset  . Diabetes Father    History  Substance Use Topics  . Smoking status: Never Smoker   . Smokeless tobacco: Never Used  . Alcohol Use: Yes     Comment: rarely   OB History   Grav Para Term Preterm Abortions TAB SAB Ect Mult Living                 Review of Systems  Constitutional: Negative for fever.  Respiratory: Positive for shortness of breath. Negative for cough.   Cardiovascular: Positive for chest pain and orthopnea. Negative for syncope and near-syncope.  Gastrointestinal: Negative for heartburn, nausea, vomiting, abdominal pain and  anorexia.  Musculoskeletal: Negative for back pain.  Neurological: Negative for weakness, numbness and headaches.  All other systems reviewed and are negative.    Allergies  Review of patient's allergies indicates no known allergies.  Home Medications   Current Outpatient Rx  Name  Route  Sig  Dispense  Refill  . Cholecalciferol (VITAMIN D) 2000 UNITS tablet   Oral   Take 2,000 Units by mouth daily.         . cilostazol (PLETAL) 50 MG tablet   Oral   Take 50 mg by mouth 2 (two) times daily.         . cycloSPORINE (RESTASIS) 0.05 % ophthalmic emulsion   Both Eyes   Place 1 drop into both eyes 2 (two) times daily.         Marland Kitchen esomeprazole (NEXIUM) 40 MG capsule   Oral   Take 40 mg by mouth 2 (two) times daily.         Marland Kitchen HYDROcodone-acetaminophen (NORCO/VICODIN) 5-325 MG per tablet   Oral   Take 1 tablet by mouth every 6 (six) hours as needed for pain.         . hydroxychloroquine (PLAQUENIL) 200 MG tablet   Oral   Take 200 mg by mouth 3 (three) times daily.         Marland Kitchen lisinopril (PRINIVIL,ZESTRIL) 10 MG tablet   Oral   Take 10 mg by mouth at  bedtime.          . metoCLOPramide (REGLAN) 5 MG tablet   Oral   Take 5 mg by mouth daily.         . Omega-3 Fatty Acids (FISH OIL) 1000 MG CAPS   Oral   Take 1 capsule by mouth daily.         . vitamin E 200 UNIT capsule   Oral   Take 200 Units by mouth daily.         Marland Kitchen ibuprofen (ADVIL,MOTRIN) 800 MG tablet   Oral   Take 1 tablet (800 mg total) by mouth 3 (three) times daily between meals as needed for pain.   15 tablet   0    BP 113/63  Pulse 75  Temp(Src) 98.3 F (36.8 C) (Oral)  Resp 21  Ht 6' (1.829 m)  Wt 243 lb (110.224 kg)  BMI 32.95 kg/m2  SpO2 99% Physical Exam  Nursing note and vitals reviewed. Constitutional: She is oriented to person, place, and time. She appears well-developed and well-nourished. No distress.  Pleasant female in no apparent distress.  HENT:  Head:  Normocephalic and atraumatic.  Mouth/Throat: Oropharynx is clear and moist. No oropharyngeal exudate.  Eyes: Conjunctivae and EOM are normal. Pupils are equal, round, and reactive to light.  Neck: Normal range of motion. Neck supple.  Cardiovascular: Normal rate, regular rhythm and normal heart sounds.  Exam reveals no gallop and no friction rub.   No murmur heard. Pulmonary/Chest: Effort normal and breath sounds normal. No respiratory distress. She has no wheezes. She has no rales. She exhibits no tenderness.  Right parasternal chest wall pain reproducible by palpation.  Abdominal: Soft. She exhibits no distension. There is no tenderness.  Musculoskeletal: Normal range of motion. She exhibits no edema and no tenderness.  Lymphadenopathy:    She has no cervical adenopathy.  Neurological: She is alert and oriented to person, place, and time.  Skin: Skin is warm and dry. No rash noted. She is not diaphoretic.  Psychiatric: She has a normal mood and affect. Her behavior is normal. Judgment and thought content normal.    ED Course  Procedures (including critical care time) Labs Review Labs Reviewed  CBC - Abnormal; Notable for the following:    MCH 25.4 (*)    All other components within normal limits  BASIC METABOLIC PANEL  D-DIMER, QUANTITATIVE  POCT I-STAT TROPONIN I  POCT PREGNANCY, URINE   Imaging Review Dg Chest 2 View  11/03/2012   CLINICAL DATA:  Chest pain. Shortness of breath. Scleroderma.  EXAM: CHEST  2 VIEW  COMPARISON:  None.  FINDINGS: Heart size is normal. Right upper lobe scarring noted as well as low lung volumes. Coarsening of interstitial markings in the lung bases is consistent with chronic interstitial disease. No evidence of acute infiltrate or edema. No evidence of pleural effusion. No mass or lymphadenopathy identified.  IMPRESSION: Chronic bibasilar interstitial lung disease and right upper lobe scarring. No acute findings.   Electronically Signed   By: Myles Rosenthal M.D.   On: 11/03/2012 21:22    EKG Interpretation   None       Date: 11/03/2012  Rate: 84  Rhythm: normal sinus rhythm  QRS Axis: normal  Intervals: normal  ST/T Wave abnormalities: nonspecific ST changes  Conduction Disutrbances:none  Narrative Interpretation:   Old EKG Reviewed: none available   MDM   1. Chest wall pain     37 year old female with a history of  scleroderma who presents with right-sided sharp chest pain. Pain waxing and waning but worsening since Thursday of last week. Associated with laying flat as well as breathing in deep. Relieved by sitting up and taking shallow breaths. Pain is been worsening and is associated with shortness of breath. No fever, cough, abdominal pain, nausea, vomiting. No history of pulmonary embolism. No signs of DVT. No recent car trips, surgeries, or immobilization.  Pain very reproducible in nature and feel that MSK pain most likely. Based on history, will need to evaluate for PE. Patient is low risk based well scoring will evaluate with d-dimer. Troponin obtained in triage negative. EKG shows normal sinus rhythm with no ST changes. Chest x-ray without consolidation, effusion, pneumothorax, widened mediastinum. Remainder basic labs unremarkable.  Urine pregnancy negative. Dimer negative. Doubt PE. Presentation more consistent with musculoskeletal pain based on exam and history. Will treat with Motrin 3 times a day with meals for the next 5 days. Patient to followup with PCP, as well as return precautions for the emergency department discussed. Patient stable for discharge.  Discussed with the patient return precautions and need for follow up with PCP. Patient voiced understanding. Stable for d/c. This patient was discussed with my attending, Dr. Ethelda Chick.   Dorna Leitz, MD 11/03/12 [phone]

## 2012-11-03 NOTE — ED Notes (Addendum)
MD  At  Bedside.

## 2012-11-03 NOTE — ED Notes (Signed)
Patient stated she started with CP last Thursday but got worse today.  Does have some SOB

## 2012-11-04 NOTE — ED Provider Notes (Addendum)
I have personally seen and examined the patient.  I have discussed the plan of care with the resident.  I have reviewed the documentation on PMH/FH/Soc. History.  I have reviewed the documentation of the resident and agree.  Doug Sou, MD 11/04/12 0005 I agree with resident EKG interpretation  Doug Sou, MD 11/16/12 [phone]

## 2012-11-06 DIAGNOSIS — J309 Allergic rhinitis, unspecified: Secondary | ICD-10-CM | POA: Diagnosis not present

## 2012-11-06 DIAGNOSIS — A6000 Herpesviral infection of urogenital system, unspecified: Secondary | ICD-10-CM | POA: Diagnosis not present

## 2012-11-06 DIAGNOSIS — K219 Gastro-esophageal reflux disease without esophagitis: Secondary | ICD-10-CM | POA: Diagnosis not present

## 2012-11-06 DIAGNOSIS — E559 Vitamin D deficiency, unspecified: Secondary | ICD-10-CM | POA: Diagnosis not present

## 2012-11-06 DIAGNOSIS — M349 Systemic sclerosis, unspecified: Secondary | ICD-10-CM | POA: Diagnosis not present

## 2012-11-27 ENCOUNTER — Other Ambulatory Visit: Payer: Self-pay

## 2012-12-02 DIAGNOSIS — L988 Other specified disorders of the skin and subcutaneous tissue: Secondary | ICD-10-CM | POA: Diagnosis not present

## 2012-12-02 DIAGNOSIS — M349 Systemic sclerosis, unspecified: Secondary | ICD-10-CM | POA: Diagnosis not present

## 2012-12-02 DIAGNOSIS — I7300 Raynaud's syndrome without gangrene: Secondary | ICD-10-CM | POA: Diagnosis not present

## 2012-12-02 DIAGNOSIS — R0602 Shortness of breath: Secondary | ICD-10-CM | POA: Diagnosis not present

## 2012-12-02 DIAGNOSIS — K224 Dyskinesia of esophagus: Secondary | ICD-10-CM | POA: Diagnosis not present

## 2012-12-02 DIAGNOSIS — Z79899 Other long term (current) drug therapy: Secondary | ICD-10-CM | POA: Diagnosis not present

## 2012-12-02 DIAGNOSIS — J8410 Pulmonary fibrosis, unspecified: Secondary | ICD-10-CM | POA: Diagnosis not present

## 2012-12-02 DIAGNOSIS — L98499 Non-pressure chronic ulcer of skin of other sites with unspecified severity: Secondary | ICD-10-CM | POA: Diagnosis not present

## 2012-12-23 ENCOUNTER — Ambulatory Visit: Payer: Medicare Other | Admitting: Vascular Surgery

## 2013-01-27 ENCOUNTER — Other Ambulatory Visit (HOSPITAL_COMMUNITY): Payer: Self-pay | Admitting: Internal Medicine

## 2013-01-27 DIAGNOSIS — J8410 Pulmonary fibrosis, unspecified: Secondary | ICD-10-CM

## 2013-01-27 LAB — PULMONARY FUNCTION TEST
DL/VA % pred: 61 %
DL/VA: 3.42 ml/min/mmHg/L
DLCO UNC % PRED: 45 %
DLCO cor % pred: 46 %
DLCO cor: 16.28 ml/min/mmHg
DLCO unc: 15.86 ml/min/mmHg
FEF 25-75 Pre: 1.13 L/sec
FEF2575-%Pred-Pre: 31 %
FEV1-%Pred-Pre: 62 %
FEV1-PRE: 2.11 L
FEV1FVC-%Pred-Pre: 74 %
FEV6-%PRED-PRE: 81 %
FEV6-Pre: 3.28 L
FEV6FVC-%PRED-PRE: 98 %
FVC-%Pred-Pre: 81 %
FVC-Pre: 3.36 L
PRE FEV1/FVC RATIO: 63 %
Pre FEV6/FVC Ratio: 98 %
RV % PRED: 82 %
RV: 1.56 L
TLC % PRED: 82 %
TLC: 5.14 L

## 2013-01-28 ENCOUNTER — Other Ambulatory Visit (HOSPITAL_COMMUNITY): Payer: Self-pay | Admitting: Internal Medicine

## 2013-01-28 DIAGNOSIS — I270 Primary pulmonary hypertension: Secondary | ICD-10-CM

## 2013-02-02 ENCOUNTER — Ambulatory Visit (HOSPITAL_COMMUNITY)
Admission: RE | Admit: 2013-02-02 | Discharge: 2013-02-02 | Disposition: A | Discharge status: Home / Self Care | Payer: Medicare Other | Source: Ambulatory Visit | Attending: Internal Medicine | Admitting: Internal Medicine

## 2013-02-02 ENCOUNTER — Inpatient Hospital Stay (HOSPITAL_COMMUNITY)
Admission: RE | Admit: 2013-02-02 | Discharge: 2013-02-02 | Disposition: A | Discharge status: Home / Self Care | Payer: 59 | Source: Ambulatory Visit

## 2013-02-02 DIAGNOSIS — I270 Primary pulmonary hypertension: Secondary | ICD-10-CM

## 2013-02-02 DIAGNOSIS — R0989 Other specified symptoms and signs involving the circulatory and respiratory systems: Secondary | ICD-10-CM | POA: Insufficient documentation

## 2013-02-02 DIAGNOSIS — I83893 Varicose veins of bilateral lower extremities with other complications: Secondary | ICD-10-CM

## 2013-02-02 DIAGNOSIS — R0609 Other forms of dyspnea: Secondary | ICD-10-CM | POA: Insufficient documentation

## 2013-02-02 NOTE — Progress Notes (Signed)
Echocardiogram 2D Echocardiogram has been performed.  Joelene Millin 02/02/2013, 1:13 PM

## 2013-02-24 DIAGNOSIS — J8410 Pulmonary fibrosis, unspecified: Secondary | ICD-10-CM | POA: Diagnosis not present

## 2013-02-24 DIAGNOSIS — R0602 Shortness of breath: Secondary | ICD-10-CM | POA: Diagnosis not present

## 2013-02-24 DIAGNOSIS — M349 Systemic sclerosis, unspecified: Secondary | ICD-10-CM | POA: Diagnosis not present

## 2013-02-24 DIAGNOSIS — I7300 Raynaud's syndrome without gangrene: Secondary | ICD-10-CM | POA: Diagnosis not present

## 2013-02-24 DIAGNOSIS — K224 Dyskinesia of esophagus: Secondary | ICD-10-CM | POA: Diagnosis not present

## 2013-02-24 DIAGNOSIS — L988 Other specified disorders of the skin and subcutaneous tissue: Secondary | ICD-10-CM | POA: Diagnosis not present

## 2013-02-24 DIAGNOSIS — I270 Primary pulmonary hypertension: Secondary | ICD-10-CM | POA: Diagnosis not present

## 2013-02-24 DIAGNOSIS — L98499 Non-pressure chronic ulcer of skin of other sites with unspecified severity: Secondary | ICD-10-CM | POA: Diagnosis not present

## 2013-02-24 DIAGNOSIS — Z79899 Other long term (current) drug therapy: Secondary | ICD-10-CM | POA: Diagnosis not present

## 2013-05-11 DIAGNOSIS — R350 Frequency of micturition: Secondary | ICD-10-CM | POA: Diagnosis not present

## 2013-05-11 DIAGNOSIS — N915 Oligomenorrhea, unspecified: Secondary | ICD-10-CM | POA: Diagnosis not present

## 2013-05-11 DIAGNOSIS — N949 Unspecified condition associated with female genital organs and menstrual cycle: Secondary | ICD-10-CM | POA: Diagnosis not present

## 2013-05-13 DIAGNOSIS — N915 Oligomenorrhea, unspecified: Secondary | ICD-10-CM | POA: Diagnosis not present

## 2013-05-13 DIAGNOSIS — N83209 Unspecified ovarian cyst, unspecified side: Secondary | ICD-10-CM | POA: Diagnosis not present

## 2013-05-13 DIAGNOSIS — N949 Unspecified condition associated with female genital organs and menstrual cycle: Secondary | ICD-10-CM | POA: Diagnosis not present

## 2013-06-22 DIAGNOSIS — M349 Systemic sclerosis, unspecified: Secondary | ICD-10-CM | POA: Diagnosis not present

## 2013-06-22 DIAGNOSIS — L98499 Non-pressure chronic ulcer of skin of other sites with unspecified severity: Secondary | ICD-10-CM | POA: Diagnosis not present

## 2013-06-22 DIAGNOSIS — D518 Other vitamin B12 deficiency anemias: Secondary | ICD-10-CM | POA: Diagnosis not present

## 2013-06-22 DIAGNOSIS — G609 Hereditary and idiopathic neuropathy, unspecified: Secondary | ICD-10-CM | POA: Diagnosis not present

## 2013-06-22 DIAGNOSIS — E118 Type 2 diabetes mellitus with unspecified complications: Secondary | ICD-10-CM | POA: Diagnosis not present

## 2013-06-22 DIAGNOSIS — L639 Alopecia areata, unspecified: Secondary | ICD-10-CM | POA: Diagnosis not present

## 2013-06-22 DIAGNOSIS — I7300 Raynaud's syndrome without gangrene: Secondary | ICD-10-CM | POA: Diagnosis not present

## 2013-06-22 DIAGNOSIS — E039 Hypothyroidism, unspecified: Secondary | ICD-10-CM | POA: Diagnosis not present

## 2013-06-22 DIAGNOSIS — I509 Heart failure, unspecified: Secondary | ICD-10-CM | POA: Diagnosis not present

## 2013-06-22 DIAGNOSIS — G603 Idiopathic progressive neuropathy: Secondary | ICD-10-CM | POA: Diagnosis not present

## 2013-06-24 ENCOUNTER — Other Ambulatory Visit (HOSPITAL_COMMUNITY): Payer: Self-pay

## 2013-06-24 ENCOUNTER — Other Ambulatory Visit (HOSPITAL_COMMUNITY): Payer: Self-pay | Admitting: Internal Medicine

## 2013-06-24 DIAGNOSIS — J8410 Pulmonary fibrosis, unspecified: Secondary | ICD-10-CM

## 2013-07-07 ENCOUNTER — Other Ambulatory Visit (HOSPITAL_COMMUNITY): Payer: Self-pay | Admitting: Internal Medicine

## 2013-07-07 DIAGNOSIS — J849 Interstitial pulmonary disease, unspecified: Secondary | ICD-10-CM

## 2013-07-08 ENCOUNTER — Encounter (HOSPITAL_COMMUNITY): Payer: Self-pay

## 2013-07-08 ENCOUNTER — Ambulatory Visit (HOSPITAL_COMMUNITY)
Admission: RE | Admit: 2013-07-08 | Discharge: 2013-07-08 | Disposition: A | Discharge status: Home / Self Care | Payer: Medicare Other | Source: Ambulatory Visit | Attending: Internal Medicine | Admitting: Internal Medicine

## 2013-07-08 DIAGNOSIS — J8410 Pulmonary fibrosis, unspecified: Secondary | ICD-10-CM | POA: Diagnosis not present

## 2013-07-08 DIAGNOSIS — J849 Interstitial pulmonary disease, unspecified: Secondary | ICD-10-CM

## 2013-07-08 DIAGNOSIS — M349 Systemic sclerosis, unspecified: Secondary | ICD-10-CM | POA: Diagnosis not present

## 2013-07-08 DIAGNOSIS — R0602 Shortness of breath: Secondary | ICD-10-CM | POA: Insufficient documentation

## 2013-07-08 DIAGNOSIS — J8409 Other alveolar and parieto-alveolar conditions: Secondary | ICD-10-CM | POA: Diagnosis not present

## 2013-07-08 MED ORDER — ALBUTEROL SULFATE (2.5 MG/3ML) 0.083% IN NEBU
2.5000 mg | INHALATION_SOLUTION | Freq: Once | RESPIRATORY_TRACT | Status: AC
  Administered 2013-07-08: 2.5 mg via RESPIRATORY_TRACT

## 2013-07-09 DIAGNOSIS — L259 Unspecified contact dermatitis, unspecified cause: Secondary | ICD-10-CM | POA: Diagnosis not present

## 2013-07-09 DIAGNOSIS — L639 Alopecia areata, unspecified: Secondary | ICD-10-CM | POA: Diagnosis not present

## 2013-07-14 DIAGNOSIS — M79609 Pain in unspecified limb: Secondary | ICD-10-CM | POA: Diagnosis not present

## 2013-07-14 DIAGNOSIS — M722 Plantar fascial fibromatosis: Secondary | ICD-10-CM | POA: Diagnosis not present

## 2013-07-14 DIAGNOSIS — M7730 Calcaneal spur, unspecified foot: Secondary | ICD-10-CM | POA: Diagnosis not present

## 2013-07-14 DIAGNOSIS — M7150 Other bursitis, not elsewhere classified, unspecified site: Secondary | ICD-10-CM | POA: Diagnosis not present

## 2013-07-15 DIAGNOSIS — N83209 Unspecified ovarian cyst, unspecified side: Secondary | ICD-10-CM | POA: Diagnosis not present

## 2013-07-20 LAB — PULMONARY FUNCTION TEST
DL/VA % PRED: 65 %
DL/VA: 3.65 ml/min/mmHg/L
DLCO COR % PRED: 50 %
DLCO COR: 17.69 ml/min/mmHg
DLCO UNC % PRED: 50 %
DLCO unc: 17.69 ml/min/mmHg
FEF 25-75 PRE: 1.26 L/s
FEF 25-75 Post: 1.65 L/sec
FEF2575-%CHANGE-POST: 30 %
FEF2575-%PRED-POST: 45 %
FEF2575-%PRED-PRE: 35 %
FEV1-%Change-Post: 8 %
FEV1-%Pred-Post: 71 %
FEV1-%Pred-Pre: 65 %
FEV1-PRE: 2.22 L
FEV1-Post: 2.42 L
FEV1FVC-%Change-Post: 5 %
FEV1FVC-%PRED-PRE: 78 %
FEV6-%CHANGE-POST: 2 %
FEV6-%PRED-POST: 85 %
FEV6-%Pred-Pre: 82 %
FEV6-Post: 3.45 L
FEV6-Pre: 3.35 L
FEV6FVC-%Change-Post: 0 %
FEV6FVC-%Pred-Post: 100 %
FEV6FVC-%Pred-Pre: 100 %
FVC-%Change-Post: 2 %
FVC-%PRED-POST: 84 %
FVC-%Pred-Pre: 81 %
FVC-POST: 3.45 L
FVC-Pre: 3.36 L
POST FEV1/FVC RATIO: 70 %
Post FEV6/FVC ratio: 100 %
Pre FEV1/FVC ratio: 66 %
Pre FEV6/FVC Ratio: 100 %
RV % pred: 62 %
RV: 1.18 L
TLC % pred: 77 %
TLC: 4.81 L

## 2013-07-22 DIAGNOSIS — M79609 Pain in unspecified limb: Secondary | ICD-10-CM | POA: Diagnosis not present

## 2013-07-22 DIAGNOSIS — M7150 Other bursitis, not elsewhere classified, unspecified site: Secondary | ICD-10-CM | POA: Diagnosis not present

## 2013-07-22 DIAGNOSIS — M722 Plantar fascial fibromatosis: Secondary | ICD-10-CM | POA: Diagnosis not present

## 2013-07-22 DIAGNOSIS — B351 Tinea unguium: Secondary | ICD-10-CM | POA: Diagnosis not present

## 2013-07-28 DIAGNOSIS — N939 Abnormal uterine and vaginal bleeding, unspecified: Secondary | ICD-10-CM | POA: Diagnosis not present

## 2013-07-28 DIAGNOSIS — N926 Irregular menstruation, unspecified: Secondary | ICD-10-CM | POA: Diagnosis not present

## 2013-07-28 DIAGNOSIS — N979 Female infertility, unspecified: Secondary | ICD-10-CM | POA: Diagnosis not present

## 2013-08-10 DIAGNOSIS — M349 Systemic sclerosis, unspecified: Secondary | ICD-10-CM | POA: Diagnosis not present

## 2013-08-10 DIAGNOSIS — M069 Rheumatoid arthritis, unspecified: Secondary | ICD-10-CM | POA: Diagnosis not present

## 2013-08-10 DIAGNOSIS — K219 Gastro-esophageal reflux disease without esophagitis: Secondary | ICD-10-CM | POA: Diagnosis not present

## 2013-08-10 DIAGNOSIS — D518 Other vitamin B12 deficiency anemias: Secondary | ICD-10-CM | POA: Diagnosis not present

## 2013-08-25 ENCOUNTER — Other Ambulatory Visit: Payer: Self-pay | Admitting: Obstetrics and Gynecology

## 2013-08-25 DIAGNOSIS — N6002 Solitary cyst of left breast: Secondary | ICD-10-CM

## 2013-08-25 DIAGNOSIS — N6001 Solitary cyst of right breast: Secondary | ICD-10-CM

## 2013-08-25 DIAGNOSIS — N63 Unspecified lump in unspecified breast: Secondary | ICD-10-CM

## 2013-09-10 DIAGNOSIS — R350 Frequency of micturition: Secondary | ICD-10-CM | POA: Diagnosis not present

## 2013-09-24 ENCOUNTER — Ambulatory Visit
Admission: RE | Admit: 2013-09-24 | Discharge: 2013-09-24 | Disposition: A | Discharge status: Home / Self Care | Payer: Medicare Other | Source: Ambulatory Visit | Attending: Obstetrics and Gynecology | Admitting: Obstetrics and Gynecology

## 2013-09-24 DIAGNOSIS — D249 Benign neoplasm of unspecified breast: Secondary | ICD-10-CM | POA: Diagnosis not present

## 2013-09-24 DIAGNOSIS — N63 Unspecified lump in unspecified breast: Secondary | ICD-10-CM

## 2013-10-12 ENCOUNTER — Other Ambulatory Visit: Payer: Self-pay | Admitting: Obstetrics and Gynecology

## 2013-10-12 ENCOUNTER — Other Ambulatory Visit (HOSPITAL_COMMUNITY)
Admission: RE | Admit: 2013-10-12 | Discharge: 2013-10-12 | Disposition: A | Discharge status: Home / Self Care | Payer: Medicare Other | Source: Ambulatory Visit | Attending: Obstetrics and Gynecology | Admitting: Obstetrics and Gynecology

## 2013-10-12 DIAGNOSIS — Z01419 Encounter for gynecological examination (general) (routine) without abnormal findings: Secondary | ICD-10-CM | POA: Diagnosis not present

## 2013-10-12 DIAGNOSIS — N970 Female infertility associated with anovulation: Secondary | ICD-10-CM | POA: Diagnosis not present

## 2013-10-12 DIAGNOSIS — Z124 Encounter for screening for malignant neoplasm of cervix: Secondary | ICD-10-CM | POA: Insufficient documentation

## 2013-10-13 LAB — CYTOLOGY - PAP

## 2013-10-16 DIAGNOSIS — N970 Female infertility associated with anovulation: Secondary | ICD-10-CM | POA: Diagnosis not present

## 2013-10-19 DIAGNOSIS — R0602 Shortness of breath: Secondary | ICD-10-CM | POA: Diagnosis not present

## 2013-10-19 DIAGNOSIS — R5381 Other malaise: Secondary | ICD-10-CM | POA: Diagnosis not present

## 2013-10-19 DIAGNOSIS — Z0000 Encounter for general adult medical examination without abnormal findings: Secondary | ICD-10-CM | POA: Diagnosis not present

## 2013-10-19 DIAGNOSIS — I10 Essential (primary) hypertension: Secondary | ICD-10-CM | POA: Diagnosis not present

## 2013-10-19 DIAGNOSIS — K219 Gastro-esophageal reflux disease without esophagitis: Secondary | ICD-10-CM | POA: Diagnosis not present

## 2013-10-19 DIAGNOSIS — Z23 Encounter for immunization: Secondary | ICD-10-CM | POA: Diagnosis not present

## 2013-10-19 DIAGNOSIS — K5900 Constipation, unspecified: Secondary | ICD-10-CM | POA: Diagnosis not present

## 2013-10-19 DIAGNOSIS — Z79899 Other long term (current) drug therapy: Secondary | ICD-10-CM | POA: Diagnosis not present

## 2013-10-19 DIAGNOSIS — R5383 Other fatigue: Secondary | ICD-10-CM | POA: Diagnosis not present

## 2013-11-04 DIAGNOSIS — Z79899 Other long term (current) drug therapy: Secondary | ICD-10-CM | POA: Diagnosis not present

## 2013-11-04 DIAGNOSIS — R69 Illness, unspecified: Secondary | ICD-10-CM | POA: Insufficient documentation

## 2013-11-04 DIAGNOSIS — Z23 Encounter for immunization: Secondary | ICD-10-CM | POA: Diagnosis not present

## 2013-11-04 DIAGNOSIS — M3489 Other systemic sclerosis: Secondary | ICD-10-CM | POA: Diagnosis not present

## 2013-11-04 DIAGNOSIS — E139 Other specified diabetes mellitus without complications: Secondary | ICD-10-CM | POA: Diagnosis not present

## 2013-11-04 DIAGNOSIS — J8489 Other specified interstitial pulmonary diseases: Secondary | ICD-10-CM | POA: Diagnosis not present

## 2013-11-04 DIAGNOSIS — D518 Other vitamin B12 deficiency anemias: Secondary | ICD-10-CM | POA: Diagnosis not present

## 2013-11-04 DIAGNOSIS — E538 Deficiency of other specified B group vitamins: Secondary | ICD-10-CM | POA: Diagnosis not present

## 2013-11-04 DIAGNOSIS — I509 Heart failure, unspecified: Secondary | ICD-10-CM | POA: Diagnosis not present

## 2013-11-04 DIAGNOSIS — I270 Primary pulmonary hypertension: Secondary | ICD-10-CM | POA: Diagnosis not present

## 2013-11-06 ENCOUNTER — Other Ambulatory Visit: Payer: Self-pay

## 2013-11-23 DIAGNOSIS — J069 Acute upper respiratory infection, unspecified: Secondary | ICD-10-CM | POA: Diagnosis not present

## 2013-11-30 DIAGNOSIS — N970 Female infertility associated with anovulation: Secondary | ICD-10-CM | POA: Diagnosis not present

## 2013-12-09 DIAGNOSIS — N926 Irregular menstruation, unspecified: Secondary | ICD-10-CM | POA: Diagnosis not present

## 2013-12-09 DIAGNOSIS — N970 Female infertility associated with anovulation: Secondary | ICD-10-CM | POA: Diagnosis not present

## 2013-12-23 DIAGNOSIS — Z111 Encounter for screening for respiratory tuberculosis: Secondary | ICD-10-CM | POA: Diagnosis not present

## 2014-02-04 DIAGNOSIS — J8489 Other specified interstitial pulmonary diseases: Secondary | ICD-10-CM | POA: Diagnosis not present

## 2014-02-04 DIAGNOSIS — J8417 Other interstitial pulmonary diseases with fibrosis in diseases classified elsewhere: Secondary | ICD-10-CM | POA: Diagnosis not present

## 2014-02-04 DIAGNOSIS — I509 Heart failure, unspecified: Secondary | ICD-10-CM | POA: Diagnosis not present

## 2014-02-04 DIAGNOSIS — I270 Primary pulmonary hypertension: Secondary | ICD-10-CM | POA: Diagnosis not present

## 2014-02-04 DIAGNOSIS — E139 Other specified diabetes mellitus without complications: Secondary | ICD-10-CM | POA: Diagnosis not present

## 2014-02-04 DIAGNOSIS — I272 Other secondary pulmonary hypertension: Secondary | ICD-10-CM | POA: Diagnosis not present

## 2014-02-04 DIAGNOSIS — M3489 Other systemic sclerosis: Secondary | ICD-10-CM | POA: Diagnosis not present

## 2014-02-04 DIAGNOSIS — E538 Deficiency of other specified B group vitamins: Secondary | ICD-10-CM | POA: Diagnosis not present

## 2014-02-04 DIAGNOSIS — Z79899 Other long term (current) drug therapy: Secondary | ICD-10-CM | POA: Diagnosis not present

## 2014-02-19 ENCOUNTER — Other Ambulatory Visit (HOSPITAL_COMMUNITY): Payer: Self-pay | Admitting: Internal Medicine

## 2014-02-19 ENCOUNTER — Other Ambulatory Visit (HOSPITAL_COMMUNITY): Payer: Self-pay | Admitting: Respiratory Therapy

## 2014-02-19 DIAGNOSIS — IMO0002 Reserved for concepts with insufficient information to code with codable children: Secondary | ICD-10-CM

## 2014-02-19 DIAGNOSIS — J849 Interstitial pulmonary disease, unspecified: Secondary | ICD-10-CM

## 2014-03-03 ENCOUNTER — Ambulatory Visit (HOSPITAL_COMMUNITY)
Admission: RE | Admit: 2014-03-03 | Discharge: 2014-03-03 | Disposition: A | Discharge status: Home / Self Care | Payer: Medicare Other | Source: Ambulatory Visit | Attending: Internal Medicine | Admitting: Internal Medicine

## 2014-03-03 ENCOUNTER — Encounter (HOSPITAL_COMMUNITY): Payer: Self-pay

## 2014-03-03 ENCOUNTER — Ambulatory Visit (HOSPITAL_COMMUNITY)
Admission: RE | Admit: 2014-03-03 | Discharge: 2014-03-03 | Disposition: A | Discharge status: Home / Self Care | Payer: Medicare Other | Source: Ambulatory Visit | Attending: *Deleted | Admitting: *Deleted

## 2014-03-03 DIAGNOSIS — I272 Other secondary pulmonary hypertension: Secondary | ICD-10-CM | POA: Insufficient documentation

## 2014-03-03 DIAGNOSIS — IMO0002 Reserved for concepts with insufficient information to code with codable children: Secondary | ICD-10-CM

## 2014-03-03 DIAGNOSIS — R0600 Dyspnea, unspecified: Secondary | ICD-10-CM

## 2014-03-03 LAB — PULMONARY FUNCTION TEST
DL/VA % PRED: 69 %
DL/VA: 3.9 ml/min/mmHg/L
DLCO cor % pred: 49 %
DLCO cor: 17.42 ml/min/mmHg
DLCO unc % pred: 49 %
DLCO unc: 17.42 ml/min/mmHg
FEF 25-75 PRE: 1.33 L/s
FEF2575-%Pred-Pre: 37 %
FEV1-%Pred-Pre: 66 %
FEV1-PRE: 2.25 L
FEV1FVC-%Pred-Pre: 79 %
FEV6-%Pred-Pre: 83 %
FEV6-Pre: 3.35 L
FEV6FVC-%Pred-Pre: 100 %
FVC-%Pred-Pre: 82 %
FVC-Pre: 3.38 L
Pre FEV1/FVC ratio: 67 %
Pre FEV6/FVC Ratio: 99 %
RV % pred: 79 %
RV: 1.52 L
TLC % pred: 78 %
TLC: 4.91 L

## 2014-03-03 MED ORDER — PERFLUTREN LIPID MICROSPHERE
1.0000 mL | INTRAVENOUS | Status: AC | PRN
  Administered 2014-03-03: 2 mL via INTRAVENOUS

## 2014-03-03 NOTE — Progress Notes (Addendum)
  Echocardiogram 2D Echocardiogram with Definity has been performed.  Laura Rowland 03/03/2014, 2:10 PM

## 2014-04-09 DIAGNOSIS — L638 Other alopecia areata: Secondary | ICD-10-CM | POA: Diagnosis not present

## 2014-04-09 DIAGNOSIS — L219 Seborrheic dermatitis, unspecified: Secondary | ICD-10-CM | POA: Diagnosis not present

## 2014-04-09 DIAGNOSIS — L709 Acne, unspecified: Secondary | ICD-10-CM | POA: Diagnosis not present

## 2014-05-07 DIAGNOSIS — L638 Other alopecia areata: Secondary | ICD-10-CM | POA: Diagnosis not present

## 2014-06-08 DIAGNOSIS — I509 Heart failure, unspecified: Secondary | ICD-10-CM | POA: Diagnosis not present

## 2014-06-08 DIAGNOSIS — J8489 Other specified interstitial pulmonary diseases: Secondary | ICD-10-CM | POA: Diagnosis not present

## 2014-06-08 DIAGNOSIS — M3489 Other systemic sclerosis: Secondary | ICD-10-CM | POA: Diagnosis not present

## 2014-06-08 DIAGNOSIS — I270 Primary pulmonary hypertension: Secondary | ICD-10-CM | POA: Diagnosis not present

## 2014-06-08 DIAGNOSIS — O0281 Inappropriate change in quantitative human chorionic gonadotropin (hCG) in early pregnancy: Secondary | ICD-10-CM | POA: Diagnosis not present

## 2014-06-08 DIAGNOSIS — J8417 Other interstitial pulmonary diseases with fibrosis in diseases classified elsewhere: Secondary | ICD-10-CM | POA: Diagnosis not present

## 2014-06-08 DIAGNOSIS — Z3A01 Less than 8 weeks gestation of pregnancy: Secondary | ICD-10-CM | POA: Diagnosis not present

## 2014-06-08 DIAGNOSIS — E538 Deficiency of other specified B group vitamins: Secondary | ICD-10-CM | POA: Diagnosis not present

## 2014-06-08 DIAGNOSIS — E139 Other specified diabetes mellitus without complications: Secondary | ICD-10-CM | POA: Diagnosis not present

## 2014-06-08 DIAGNOSIS — Z79899 Other long term (current) drug therapy: Secondary | ICD-10-CM | POA: Diagnosis not present

## 2014-06-08 DIAGNOSIS — I272 Other secondary pulmonary hypertension: Secondary | ICD-10-CM | POA: Diagnosis not present

## 2014-06-11 DIAGNOSIS — L638 Other alopecia areata: Secondary | ICD-10-CM | POA: Diagnosis not present

## 2014-06-14 DIAGNOSIS — Z3481 Encounter for supervision of other normal pregnancy, first trimester: Secondary | ICD-10-CM | POA: Diagnosis not present

## 2014-06-14 DIAGNOSIS — O26899 Other specified pregnancy related conditions, unspecified trimester: Secondary | ICD-10-CM | POA: Diagnosis not present

## 2014-06-14 DIAGNOSIS — Z3480 Encounter for supervision of other normal pregnancy, unspecified trimester: Secondary | ICD-10-CM | POA: Diagnosis not present

## 2014-06-14 DIAGNOSIS — O200 Threatened abortion: Secondary | ICD-10-CM | POA: Diagnosis not present

## 2014-06-14 DIAGNOSIS — O09521 Supervision of elderly multigravida, first trimester: Secondary | ICD-10-CM | POA: Diagnosis not present

## 2014-06-16 DIAGNOSIS — O200 Threatened abortion: Secondary | ICD-10-CM | POA: Diagnosis not present

## 2014-06-23 ENCOUNTER — Other Ambulatory Visit (HOSPITAL_COMMUNITY): Payer: Self-pay | Admitting: Obstetrics and Gynecology

## 2014-06-23 DIAGNOSIS — O021 Missed abortion: Secondary | ICD-10-CM | POA: Diagnosis not present

## 2014-06-23 DIAGNOSIS — O09521 Supervision of elderly multigravida, first trimester: Secondary | ICD-10-CM | POA: Diagnosis not present

## 2014-06-23 DIAGNOSIS — O200 Threatened abortion: Secondary | ICD-10-CM | POA: Diagnosis not present

## 2014-06-24 ENCOUNTER — Encounter (HOSPITAL_COMMUNITY): Payer: Self-pay | Admitting: *Deleted

## 2014-06-27 ENCOUNTER — Encounter (HOSPITAL_COMMUNITY): Payer: Self-pay | Admitting: Anesthesiology

## 2014-06-27 NOTE — Anesthesia Preprocedure Evaluation (Addendum)
Anesthesia Evaluation  Patient identified by MRN, date of birth, ID band Patient awake    Reviewed: Allergy & Precautions, NPO status , Patient's Chart, lab work & pertinent test results, reviewed documented beta blocker date and time   Airway Mallampati: III  TM Distance: >3 FB Neck ROM: Full    Dental no notable dental hx.    Pulmonary neg pulmonary ROS,  breath sounds clear to auscultation- rhonchi  Pulmonary exam normal       Cardiovascular Pt. on medications + Peripheral Vascular Disease Normal cardiovascular examRhythm:Regular Rate:Normal     Neuro/Psych negative neurological ROS  negative psych ROS   GI/Hepatic Neg liver ROS, GERD-  ,  Endo/Other  Obesity  Renal/GU negative Renal ROS  negative genitourinary   Musculoskeletal Scleroderma   Abdominal   Peds  Hematology  (+) anemia ,   Anesthesia Other Findings   Reproductive/Obstetrics Missed Ab HSV                           Anesthesia Physical Anesthesia Plan  ASA: II  Anesthesia Plan: MAC   Post-op Pain Management:    Induction: Intravenous  Airway Management Planned: Natural Airway and Mask  Additional Equipment:   Intra-op Plan:   Post-operative Plan:   Informed Consent: I have reviewed the patients History and Physical, chart, labs and discussed the procedure including the risks, benefits and alternatives for the proposed anesthesia with the patient or authorized representative who has indicated his/her understanding and acceptance.     Plan Discussed with: CRNA, Anesthesiologist and Surgeon  Anesthesia Plan Comments:         Anesthesia Quick Evaluation

## 2014-06-28 ENCOUNTER — Other Ambulatory Visit (HOSPITAL_COMMUNITY): Payer: Self-pay | Admitting: Obstetrics and Gynecology

## 2014-06-28 ENCOUNTER — Ambulatory Visit (HOSPITAL_COMMUNITY): Payer: Medicare Other | Admitting: Registered Nurse

## 2014-06-28 ENCOUNTER — Encounter (HOSPITAL_COMMUNITY)
Admission: RE | Disposition: A | Discharge status: Home / Self Care | Payer: Self-pay | Source: Ambulatory Visit | Attending: Obstetrics and Gynecology

## 2014-06-28 ENCOUNTER — Ambulatory Visit (HOSPITAL_COMMUNITY): Payer: Medicare Other

## 2014-06-28 ENCOUNTER — Encounter (HOSPITAL_COMMUNITY): Payer: Self-pay

## 2014-06-28 ENCOUNTER — Ambulatory Visit (HOSPITAL_COMMUNITY)
Admission: RE | Admit: 2014-06-28 | Discharge: 2014-06-28 | Disposition: A | Discharge status: Home / Self Care | Payer: Medicare Other | Source: Ambulatory Visit | Attending: Obstetrics and Gynecology | Admitting: Obstetrics and Gynecology

## 2014-06-28 DIAGNOSIS — I739 Peripheral vascular disease, unspecified: Secondary | ICD-10-CM | POA: Insufficient documentation

## 2014-06-28 DIAGNOSIS — D649 Anemia, unspecified: Secondary | ICD-10-CM | POA: Insufficient documentation

## 2014-06-28 DIAGNOSIS — O021 Missed abortion: Secondary | ICD-10-CM | POA: Diagnosis not present

## 2014-06-28 DIAGNOSIS — Z3A00 Weeks of gestation of pregnancy not specified: Secondary | ICD-10-CM | POA: Diagnosis not present

## 2014-06-28 HISTORY — PX: DILATION AND EVACUATION: SHX1459

## 2014-06-28 LAB — CBC
HCT: 35.4 % — ABNORMAL LOW (ref 36.0–46.0)
Hemoglobin: 11.7 g/dL — ABNORMAL LOW (ref 12.0–15.0)
MCH: 24.7 pg — ABNORMAL LOW (ref 26.0–34.0)
MCHC: 33.1 g/dL (ref 30.0–36.0)
MCV: 74.7 fL — ABNORMAL LOW (ref 78.0–100.0)
Platelets: 208 10*3/uL (ref 150–400)
RBC: 4.74 MIL/uL (ref 3.87–5.11)
RDW: 14.4 % (ref 11.5–15.5)
WBC: 6.2 10*3/uL (ref 4.0–10.5)

## 2014-06-28 SURGERY — DILATION AND EVACUATION, UTERUS
Anesthesia: Monitor Anesthesia Care

## 2014-06-28 MED ORDER — FENTANYL CITRATE (PF) 100 MCG/2ML IJ SOLN
INTRAMUSCULAR | Status: DC | PRN
  Administered 2014-06-28 (×2): 50 ug via INTRAVENOUS

## 2014-06-28 MED ORDER — SILVER NITRATE-POT NITRATE 75-25 % EX MISC
CUTANEOUS | Status: AC
  Filled 2014-06-28: qty 1

## 2014-06-28 MED ORDER — SCOPOLAMINE 1 MG/3DAYS TD PT72
1.0000 | MEDICATED_PATCH | Freq: Once | TRANSDERMAL | Status: DC
  Administered 2014-06-28: 1.5 mg via TRANSDERMAL

## 2014-06-28 MED ORDER — FENTANYL CITRATE (PF) 100 MCG/2ML IJ SOLN
INTRAMUSCULAR | Status: AC
  Filled 2014-06-28: qty 2

## 2014-06-28 MED ORDER — PROPOFOL 10 MG/ML IV BOLUS
INTRAVENOUS | Status: DC | PRN
  Administered 2014-06-28: 30 mg via INTRAVENOUS
  Administered 2014-06-28: 20 mg via INTRAVENOUS
  Administered 2014-06-28: 30 mg via INTRAVENOUS
  Administered 2014-06-28: 20 mg via INTRAVENOUS
  Administered 2014-06-28: 30 mg via INTRAVENOUS
  Administered 2014-06-28 (×2): 20 mg via INTRAVENOUS

## 2014-06-28 MED ORDER — BUPIVACAINE HCL (PF) 0.25 % IJ SOLN
INTRAMUSCULAR | Status: DC | PRN
  Administered 2014-06-28: 20 mL

## 2014-06-28 MED ORDER — DEXAMETHASONE SODIUM PHOSPHATE 4 MG/ML IJ SOLN
INTRAMUSCULAR | Status: DC | PRN
  Administered 2014-06-28: 4 mg via INTRAVENOUS

## 2014-06-28 MED ORDER — DEXAMETHASONE SODIUM PHOSPHATE 4 MG/ML IJ SOLN
INTRAMUSCULAR | Status: AC
  Filled 2014-06-28: qty 1

## 2014-06-28 MED ORDER — MEPERIDINE HCL 25 MG/ML IJ SOLN: 6.2500 mg | INTRAMUSCULAR | Status: DC | PRN

## 2014-06-28 MED ORDER — PROPOFOL 10 MG/ML IV BOLUS
INTRAVENOUS | Status: AC
  Filled 2014-06-28: qty 20

## 2014-06-28 MED ORDER — LIDOCAINE HCL (CARDIAC) 20 MG/ML IV SOLN
INTRAVENOUS | Status: DC | PRN
  Administered 2014-06-28: 100 mg via INTRAVENOUS

## 2014-06-28 MED ORDER — KETOROLAC TROMETHAMINE 30 MG/ML IJ SOLN
INTRAMUSCULAR | Status: DC | PRN
  Administered 2014-06-28: 30 mg via INTRAVENOUS

## 2014-06-28 MED ORDER — LIDOCAINE HCL (CARDIAC) 20 MG/ML IV SOLN
INTRAVENOUS | Status: AC
  Filled 2014-06-28: qty 5

## 2014-06-28 MED ORDER — KETOROLAC TROMETHAMINE 30 MG/ML IJ SOLN
INTRAMUSCULAR | Status: AC
  Filled 2014-06-28: qty 1

## 2014-06-28 MED ORDER — ONDANSETRON HCL 4 MG/2ML IJ SOLN
INTRAMUSCULAR | Status: AC
  Filled 2014-06-28: qty 2

## 2014-06-28 MED ORDER — IBUPROFEN 800 MG PO TABS: 800.0000 mg | ORAL_TABLET | Freq: Three times a day (TID) | ORAL | Status: DC | PRN

## 2014-06-28 MED ORDER — MIDAZOLAM HCL 2 MG/2ML IJ SOLN
INTRAMUSCULAR | Status: AC
  Filled 2014-06-28: qty 2

## 2014-06-28 MED ORDER — BUPIVACAINE HCL (PF) 0.25 % IJ SOLN
INTRAMUSCULAR | Status: AC
  Filled 2014-06-28: qty 30

## 2014-06-28 MED ORDER — SCOPOLAMINE 1 MG/3DAYS TD PT72
MEDICATED_PATCH | TRANSDERMAL | Status: AC
  Administered 2014-06-28: 1.5 mg via TRANSDERMAL
  Filled 2014-06-28: qty 1

## 2014-06-28 MED ORDER — METOCLOPRAMIDE HCL 5 MG/ML IJ SOLN: 10.0000 mg | Freq: Once | INTRAMUSCULAR | Status: DC | PRN

## 2014-06-28 MED ORDER — LACTATED RINGERS IV SOLN
INTRAVENOUS | Status: DC
  Administered 2014-06-28: 07:00:00 via INTRAVENOUS

## 2014-06-28 MED ORDER — MIDAZOLAM HCL 5 MG/5ML IJ SOLN
INTRAMUSCULAR | Status: DC | PRN
  Administered 2014-06-28: 2 mg via INTRAVENOUS

## 2014-06-28 MED ORDER — FENTANYL CITRATE (PF) 100 MCG/2ML IJ SOLN
25.0000 ug | INTRAMUSCULAR | Status: DC | PRN
  Administered 2014-06-28: 25 ug via INTRAVENOUS

## 2014-06-28 MED ORDER — ONDANSETRON HCL 4 MG/2ML IJ SOLN
INTRAMUSCULAR | Status: DC | PRN
  Administered 2014-06-28: 4 mg via INTRAVENOUS

## 2014-06-28 MED ORDER — HYDROCODONE-ACETAMINOPHEN 7.5-325 MG PO TABS: 1.0000 | ORAL_TABLET | Freq: Once | ORAL | Status: DC | PRN

## 2014-06-28 SURGICAL SUPPLY — 18 items
CATH ROBINSON RED A/P 16FR (CATHETERS) ×2 IMPLANT
CLOTH BEACON ORANGE TIMEOUT ST (SAFETY) ×2 IMPLANT
DECANTER SPIKE VIAL GLASS SM (MISCELLANEOUS) ×2 IMPLANT
GLOVE BIOGEL M 6.5 STRL (GLOVE) ×4 IMPLANT
GLOVE BIOGEL PI IND STRL 6.5 (GLOVE) ×1 IMPLANT
GLOVE BIOGEL PI INDICATOR 6.5 (GLOVE) ×1
GOWN STRL REUS W/TWL LRG LVL3 (GOWN DISPOSABLE) ×4 IMPLANT
KIT BERKELEY 1ST TRIMESTER 3/8 (MISCELLANEOUS) ×2 IMPLANT
NS IRRIG 1000ML POUR BTL (IV SOLUTION) ×2 IMPLANT
PACK VAGINAL MINOR WOMEN LF (CUSTOM PROCEDURE TRAY) ×2 IMPLANT
PAD OB MATERNITY 4.3X12.25 (PERSONAL CARE ITEMS) ×2 IMPLANT
PAD PREP 24X48 CUFFED NSTRL (MISCELLANEOUS) ×2 IMPLANT
SET BERKELEY SUCTION TUBING (SUCTIONS) ×2 IMPLANT
TOWEL OR 17X24 6PK STRL BLUE (TOWEL DISPOSABLE) ×4 IMPLANT
VACURETTE 10 RIGID CVD (CANNULA) IMPLANT
VACURETTE 7MM CVD STRL WRAP (CANNULA) IMPLANT
VACURETTE 8 RIGID CVD (CANNULA) IMPLANT
VACURETTE 9 RIGID CVD (CANNULA) IMPLANT

## 2014-06-28 NOTE — Transfer of Care (Signed)
Immediate Anesthesia Transfer of Care Note  Patient: Laura Rowland  Procedure(s) Performed: Procedure(s): DILATATION AND EVACUATION (N/A)  Patient Location: PACU  Anesthesia Type:MAC  Level of Consciousness: awake, alert  and oriented  Airway & Oxygen Therapy: Patient Spontanous Breathing and Patient connected to nasal cannula oxygen  Post-op Assessment: Report given to RN  Post vital signs: Reviewed  Last Vitals:  Filed Vitals:   06/28/14 0631  BP: 127/66  Pulse: 79  Temp: 36.7 C  Resp: 16    Complications: No apparent anesthesia complications

## 2014-06-28 NOTE — Op Note (Signed)
06/28/2014  8:00 AM  PATIENT:  Laura Rowland  39 y.o. female  PRE-OPERATIVE DIAGNOSIS:  O02.1  Missed Ab  POST-OPERATIVE DIAGNOSIS:  O02.1  Missed Ab  PROCEDURE:  Procedure(s): DILATATION AND EVACUATION (N/A)  SURGEON:  Surgeon(s) and Role:    * Christophe Louis, MD - Primary  PHYSICIAN ASSISTANT: None  ASSISTANTS: none   ANESTHESIA:   IV sedation  EBL:  Total I/O In: 700 [I.V.:700] Out: 75 [Urine:75]  BLOOD ADMINISTERED:none  DRAINS: none   LOCAL MEDICATIONS USED:  MARCAINE     SPECIMEN:  Source of Specimen:  endometrial currettings  DISPOSITION OF SPECIMEN:  PATHOLOGY  COUNTS:  YES  TOURNIQUET:  * No tourniquets in log *  DICTATION: .Dragon Dictation  PLAN OF CARE: Discharge to home after PACU  PATIENT DISPOSITION:  PACU - hemodynamically stable.   Delay start of Pharmacological VTE agent (>24hrs) due to surgical blood loss or risk of bleeding: not applicable  Procedure: Patient was taken to the operating room where she was placed under general anesthesia. She was placed in the dorsal lithotomy position. She was prepped and draped in the usual sterile fashion. A speculum was placed into the vaginal vault. The anterior lip of the cervix was grasped with a single-tooth tenaculum. Quarter percent Marcaine was injected at the 4 and 8:00 positions of the cervix. The cervix was then sounded to 11 cm. The cervix was dilated to approximately 9 mm. 9 mm suction curette was inserted. The products of conception were removed without difficulty under ultrasound guidance.   tenaculum was removed from the anterior lip of the cervix. The speculum was removed from the patient's vagina. She was awakened from anesthesia taken to the recovery  room awake and in stable condition. Sponge lap and needle counts were correct x2.

## 2014-06-28 NOTE — Anesthesia Postprocedure Evaluation (Signed)
  Anesthesia Post-op Note  Patient: Laura Rowland  Procedure(s) Performed: Procedure(s): DILATATION AND EVACUATION (N/A) Patient is awake and responsive. Pain and nausea are reasonably well controlled. Vital signs are stable and clinically acceptable. Oxygen saturation is clinically acceptable. There are no apparent anesthetic complications at this time. Patient is ready for discharge.

## 2014-06-28 NOTE — Discharge Instructions (Signed)
DISCHARGE INSTRUCTIONS: D&C / D&E The following instructions have been prepared to help you care for yourself upon your return home.  No Ibuprofen containing products until after 2 pm today.   Personal hygiene:  Use sanitary pads for vaginal drainage, not tampons.  Shower the day after your procedure.  NO tub baths, pools or Jacuzzis for 2-3 weeks.  Wipe front to back after using the bathroom.  Activity and limitations:  Do NOT drive or operate any equipment for 24 hours. The effects of anesthesia are still present and drowsiness may result.  Do NOT rest in bed all day.  Walking is encouraged.  Walk up and down stairs slowly.  You may resume your normal activity in one to two days or as indicated by your physician.  Sexual activity: NO intercourse for at least 2 weeks after the procedure, or as indicated by your physician.  Diet: Eat a light meal as desired this evening. You may resume your usual diet tomorrow.  Return to work: You may resume your work activities in one to two days or as indicated by your doctor.  What to expect after your surgery: Expect to have vaginal bleeding/discharge for 2-3 days and spotting for up to 10 days. It is not unusual to have soreness for up to 1-2 weeks. You may have a slight burning sensation when you urinate for the first day. Mild cramps may continue for a couple of days. You may have a regular period in 2-6 weeks.  Call your doctor for any of the following:  Excessive vaginal bleeding, saturating and changing one pad every hour.  Inability to urinate 6 hours after discharge from hospital.  Pain not relieved by pain medication.  Fever of 100.4 F or greater.  Unusual vaginal discharge or odor.   Call for an appointment:    Patients signature: ______________________  Nurses signature ________________________  Support person's signature_______________________

## 2014-06-28 NOTE — H&P (Signed)
Chief Complaint(s):   History and pHysical for Missed AB   HPI:  General 39 y/o G3P1011 at 9 wks 1 day by LMP Presented to the office today for interval ultrasound. she had an ultrasound on 06/16/2014 that showed an 8 wk 0 day gestational sac with questionable fetal pole. as well as possible second gestational sac. ultrasound today reveals 8 wk 6 day gestational sac without a fetal pole possile 2nd gestational sac with no fetal pole. she denies vaginal bleeding. she has had some cramping.  Current Medication:  Taking  Plaquenil(Hydroxychloroquine Sulfate) 200 MG Tablet 2 tablet every morning 1 tablet evening, Notes: Dr Boris Lown     Vitamin D 2000 UNIT Tablet 1 tablet with a meal Once a day     CitraNatal 90 DHA(Prenatal-DSS-Ca-Fe Cbn-FA-DHA) 90-1 & 300 MG Miscellaneous as directed once a day     Provera(Medroxyprogesterone) 10 MG Tablet as directed 1 a day     Nexium(Esomeprazole Magnesium) 20 MG Capsule Delayed Release 1 capsule Once a day     Valacyclovir HCl 500 MG Tablet 1 tablet once a day   Medical History:   Scleroderma Rheumatologist in [facility] Dr. Boris Lown [phone]     Dr.Hecker eye exams Plaquenil 5/11, due back 10/11 nl, 5/13 on Restasis dry eyes     EGD 09/2009 Dr. Penelope Coop no celiac dz   Allergies/Intolerance:   N.K.D.A.   Gyn History:   Sexual activity currently sexually active -. Periods : every month. LMP 04/20/2014. Birth control none. Last pap smear date 10/12/13. Last mammogram date 09/2013 Breast Ultrasound. Abnormal pap smear treated with LEEP. STD HSV, [place].   OB History:   Number of pregnancies 2. Pregnancy # 1 abortion. Pregnancy # 2 live birth, preterm 58 weeks, girl, C-section.   Surgical History:   C section     breast cyst removed L     cyst removed from L hand   Hospitalization:   Childbirth   Family History:   Father: alive 80 yrs, Diabetes, diagnosed with DM    Mother: alive 73 yrs, A + W    Brother 1: alive 15 yrs, bipolar    Children:  alive 61 yrs, premature birth    1daughter(s) .     Non-Contributory   denies any GYN family cancer hx. Negative GI family history.  Social History:  General Tobacco use cigarettes: Never smoked, Tobacco history last updated 06/14/2014.  no Smoking.  no Alcohol.  Caffeine: yes, tea, soda.  no Recreational drug use.  no Diet.  no Exercise.  Occupation: unemployed, stay at home mom, disabled due to scleroderma .  Marital Status: married.  Children: 1.  ROS: Negative except as stated in HPI Negative except as stated in HPI.   Objective:  Vitals:  Wt 254, Wt change -2 lb, BP sitting 110/64, Respirations 16  Past Results:  Examination:  General Examination alert, oriented, NAD" label="GENERAL APPEARANCE" categoryPropId="[id number]" examid="[id number]"GENERAL APPEARANCE alert, oriented, NAD.  moist, warm" label="SKIN:" categoryPropId="10109" examid="[id number]"SKIN: moist, warm.  clear to auscultation bilaterally" label="LUNGS:" categoryPropId="87" examid="[id number]"LUNGS: clear to auscultation bilaterally.  no murmurs, regular rate and rhythm" label="HEART:" categoryPropId="86" examid="[id number]"HEART: no murmurs, regular rate and rhythm.  soft, non-tender/non-distended, bowel sounds present" label="ABDOMEN:" categoryPropId="88" examid="[id number]"ABDOMEN: soft, non-tender/non-distended, bowel sounds present.  normal external genitalia, vagina - pink moist mucosa, no lesions or abnormal discharge, cervix - no discharge or lesions or CMT, adnexa - no masses or tenderness, uterus - nontender and normal size on palpation" label="FEMALE GENITOURINARY:" categoryPropId="13414" examid="[id number]"FEMALE GENITOURINARY: normal external genitalia, vagina - pink  moist mucosa, no lesions or abnormal discharge, cervix - no discharge or lesions or CMT, adnexa - no masses or tenderness, uterus - nontender and normal size on palpation.  normal range of motion" label="EXTREMITIES:" categoryPropId="89" examid="[id number]"EXTREMITIES:  normal range of motion.  alert and oriented x 3" label="NEUROLOGIC EXAM:" categoryPropId="10110" examid="[id number]"NEUROLOGIC EXAM: alert and oriented x 3.  Physical Examination:    Assessment:    Missed ab - O02.1     Plan:  Treatment:   Missed ab  Notes: Ultrasound confirms Missed AB. options of management were discussed with the patient expectant management vs cytotec vs D&C . She desires D&C. R/B/A of D&C discussed including but not limited to infection bleeding perforation of the uterus with the need for further surgery . Pt voiced understanding and desires to proceed.

## 2014-06-28 NOTE — H&P (Signed)
Date of Initial H&P:06/28/2014  History reviewed, patient examined, no change in status, stable for surgery.

## 2014-06-29 ENCOUNTER — Encounter (HOSPITAL_COMMUNITY): Payer: Self-pay | Admitting: Obstetrics and Gynecology

## 2014-07-09 DIAGNOSIS — L638 Other alopecia areata: Secondary | ICD-10-CM | POA: Diagnosis not present

## 2014-07-16 DIAGNOSIS — O021 Missed abortion: Secondary | ICD-10-CM | POA: Diagnosis not present

## 2014-07-22 DIAGNOSIS — O039 Complete or unspecified spontaneous abortion without complication: Secondary | ICD-10-CM | POA: Insufficient documentation

## 2014-07-29 DIAGNOSIS — O021 Missed abortion: Secondary | ICD-10-CM | POA: Diagnosis not present

## 2014-09-08 DIAGNOSIS — E139 Other specified diabetes mellitus without complications: Secondary | ICD-10-CM | POA: Diagnosis not present

## 2014-09-08 DIAGNOSIS — E538 Deficiency of other specified B group vitamins: Secondary | ICD-10-CM | POA: Diagnosis not present

## 2014-09-08 DIAGNOSIS — M3489 Other systemic sclerosis: Secondary | ICD-10-CM | POA: Diagnosis not present

## 2014-09-08 DIAGNOSIS — I272 Other secondary pulmonary hypertension: Secondary | ICD-10-CM | POA: Diagnosis not present

## 2014-09-08 DIAGNOSIS — J8417 Other interstitial pulmonary diseases with fibrosis in diseases classified elsewhere: Secondary | ICD-10-CM | POA: Diagnosis not present

## 2014-09-20 DIAGNOSIS — L638 Other alopecia areata: Secondary | ICD-10-CM | POA: Diagnosis not present

## 2014-10-13 ENCOUNTER — Other Ambulatory Visit: Payer: Self-pay | Admitting: Obstetrics and Gynecology

## 2014-10-13 DIAGNOSIS — Z124 Encounter for screening for malignant neoplasm of cervix: Secondary | ICD-10-CM | POA: Diagnosis not present

## 2014-10-13 DIAGNOSIS — Z9189 Other specified personal risk factors, not elsewhere classified: Secondary | ICD-10-CM | POA: Diagnosis not present

## 2014-10-18 LAB — CYTOLOGY - PAP

## 2014-10-22 ENCOUNTER — Other Ambulatory Visit (HOSPITAL_COMMUNITY)
Admission: RE | Admit: 2014-10-22 | Discharge: 2014-10-22 | Disposition: A | Discharge status: Home / Self Care | Payer: Medicare Other | Source: Ambulatory Visit | Attending: Obstetrics and Gynecology | Admitting: Obstetrics and Gynecology

## 2014-10-22 DIAGNOSIS — Z124 Encounter for screening for malignant neoplasm of cervix: Secondary | ICD-10-CM | POA: Insufficient documentation

## 2014-12-07 DIAGNOSIS — L668 Other cicatricial alopecia: Secondary | ICD-10-CM | POA: Diagnosis not present

## 2014-12-13 DIAGNOSIS — R5383 Other fatigue: Secondary | ICD-10-CM | POA: Diagnosis not present

## 2014-12-13 DIAGNOSIS — K219 Gastro-esophageal reflux disease without esophagitis: Secondary | ICD-10-CM | POA: Diagnosis not present

## 2014-12-13 DIAGNOSIS — Z131 Encounter for screening for diabetes mellitus: Secondary | ICD-10-CM | POA: Diagnosis not present

## 2014-12-13 DIAGNOSIS — M349 Systemic sclerosis, unspecified: Secondary | ICD-10-CM | POA: Diagnosis not present

## 2014-12-13 DIAGNOSIS — Z0000 Encounter for general adult medical examination without abnormal findings: Secondary | ICD-10-CM | POA: Diagnosis not present

## 2014-12-13 DIAGNOSIS — Z23 Encounter for immunization: Secondary | ICD-10-CM | POA: Diagnosis not present

## 2014-12-13 DIAGNOSIS — Z13220 Encounter for screening for lipoid disorders: Secondary | ICD-10-CM | POA: Diagnosis not present

## 2014-12-13 DIAGNOSIS — G43909 Migraine, unspecified, not intractable, without status migrainosus: Secondary | ICD-10-CM | POA: Diagnosis not present

## 2015-01-05 DIAGNOSIS — I272 Other secondary pulmonary hypertension: Secondary | ICD-10-CM | POA: Diagnosis not present

## 2015-01-05 DIAGNOSIS — E538 Deficiency of other specified B group vitamins: Secondary | ICD-10-CM | POA: Diagnosis not present

## 2015-01-05 DIAGNOSIS — J8489 Other specified interstitial pulmonary diseases: Secondary | ICD-10-CM | POA: Diagnosis not present

## 2015-01-05 DIAGNOSIS — I270 Primary pulmonary hypertension: Secondary | ICD-10-CM | POA: Diagnosis not present

## 2015-01-05 DIAGNOSIS — E139 Other specified diabetes mellitus without complications: Secondary | ICD-10-CM | POA: Diagnosis not present

## 2015-01-05 DIAGNOSIS — J8417 Other interstitial pulmonary diseases with fibrosis in diseases classified elsewhere: Secondary | ICD-10-CM | POA: Diagnosis not present

## 2015-01-05 DIAGNOSIS — I509 Heart failure, unspecified: Secondary | ICD-10-CM | POA: Diagnosis not present

## 2015-01-05 DIAGNOSIS — L639 Alopecia areata, unspecified: Secondary | ICD-10-CM | POA: Insufficient documentation

## 2015-01-05 DIAGNOSIS — M3489 Other systemic sclerosis: Secondary | ICD-10-CM | POA: Diagnosis not present

## 2015-01-12 DIAGNOSIS — Z131 Encounter for screening for diabetes mellitus: Secondary | ICD-10-CM | POA: Diagnosis not present

## 2015-01-12 DIAGNOSIS — G43909 Migraine, unspecified, not intractable, without status migrainosus: Secondary | ICD-10-CM | POA: Diagnosis not present

## 2015-01-12 DIAGNOSIS — Z13220 Encounter for screening for lipoid disorders: Secondary | ICD-10-CM | POA: Diagnosis not present

## 2015-01-12 DIAGNOSIS — Z23 Encounter for immunization: Secondary | ICD-10-CM | POA: Diagnosis not present

## 2015-01-12 DIAGNOSIS — Z0000 Encounter for general adult medical examination without abnormal findings: Secondary | ICD-10-CM | POA: Diagnosis not present

## 2015-01-12 DIAGNOSIS — R5383 Other fatigue: Secondary | ICD-10-CM | POA: Diagnosis not present

## 2015-01-12 DIAGNOSIS — K219 Gastro-esophageal reflux disease without esophagitis: Secondary | ICD-10-CM | POA: Diagnosis not present

## 2015-01-12 DIAGNOSIS — M349 Systemic sclerosis, unspecified: Secondary | ICD-10-CM | POA: Diagnosis not present

## 2015-01-13 DIAGNOSIS — J45909 Unspecified asthma, uncomplicated: Secondary | ICD-10-CM | POA: Diagnosis not present

## 2015-01-25 ENCOUNTER — Other Ambulatory Visit (HOSPITAL_COMMUNITY): Payer: Self-pay | Admitting: Respiratory Therapy

## 2015-01-25 DIAGNOSIS — J8417 Other interstitial pulmonary diseases with fibrosis in diseases classified elsewhere: Principal | ICD-10-CM

## 2015-01-25 DIAGNOSIS — J84178 Other interstitial pulmonary diseases with fibrosis in diseases classified elsewhere: Secondary | ICD-10-CM

## 2015-01-26 ENCOUNTER — Other Ambulatory Visit (HOSPITAL_COMMUNITY): Payer: Self-pay | Admitting: Internal Medicine

## 2015-01-26 DIAGNOSIS — I159 Secondary hypertension, unspecified: Secondary | ICD-10-CM

## 2015-01-27 DIAGNOSIS — L639 Alopecia areata, unspecified: Secondary | ICD-10-CM | POA: Diagnosis not present

## 2015-01-28 ENCOUNTER — Ambulatory Visit (HOSPITAL_COMMUNITY)
Admission: RE | Admit: 2015-01-28 | Discharge: 2015-01-28 | Disposition: A | Discharge status: Home / Self Care | Payer: Medicare HMO | Source: Ambulatory Visit | Attending: Internal Medicine | Admitting: Internal Medicine

## 2015-01-28 ENCOUNTER — Encounter (HOSPITAL_COMMUNITY): Payer: Medicare Other

## 2015-01-28 ENCOUNTER — Other Ambulatory Visit (HOSPITAL_COMMUNITY): Payer: Self-pay | Admitting: Internal Medicine

## 2015-01-28 ENCOUNTER — Ambulatory Visit (HOSPITAL_BASED_OUTPATIENT_CLINIC_OR_DEPARTMENT_OTHER)
Admission: RE | Admit: 2015-01-28 | Discharge: 2015-01-28 | Disposition: A | Discharge status: Home / Self Care | Payer: Medicare HMO | Source: Ambulatory Visit | Attending: Internal Medicine | Admitting: Internal Medicine

## 2015-01-28 DIAGNOSIS — J849 Interstitial pulmonary disease, unspecified: Secondary | ICD-10-CM | POA: Insufficient documentation

## 2015-01-28 DIAGNOSIS — I159 Secondary hypertension, unspecified: Secondary | ICD-10-CM

## 2015-01-28 DIAGNOSIS — E669 Obesity, unspecified: Secondary | ICD-10-CM | POA: Insufficient documentation

## 2015-01-28 DIAGNOSIS — H40013 Open angle with borderline findings, low risk, bilateral: Secondary | ICD-10-CM | POA: Diagnosis not present

## 2015-01-28 DIAGNOSIS — H04123 Dry eye syndrome of bilateral lacrimal glands: Secondary | ICD-10-CM | POA: Diagnosis not present

## 2015-01-28 DIAGNOSIS — I272 Other secondary pulmonary hypertension: Secondary | ICD-10-CM | POA: Insufficient documentation

## 2015-01-28 DIAGNOSIS — H5213 Myopia, bilateral: Secondary | ICD-10-CM | POA: Diagnosis not present

## 2015-01-28 DIAGNOSIS — Z6834 Body mass index (BMI) 34.0-34.9, adult: Secondary | ICD-10-CM | POA: Diagnosis not present

## 2015-01-28 DIAGNOSIS — H1013 Acute atopic conjunctivitis, bilateral: Secondary | ICD-10-CM | POA: Diagnosis not present

## 2015-01-28 DIAGNOSIS — M349 Systemic sclerosis, unspecified: Secondary | ICD-10-CM | POA: Insufficient documentation

## 2015-01-28 LAB — PULMONARY FUNCTION TEST
DL/VA % pred: 68 %
DL/VA: 3.86 ml/min/mmHg/L
DLCO UNC % PRED: 47 %
DLCO unc: 16.62 ml/min/mmHg
FEF 25-75 Pre: 1.17 L/sec
FEF2575-%Pred-Pre: 33 %
FEV1-%Pred-Pre: 60 %
FEV1-Pre: 2.02 L
FEV1FVC-%Pred-Pre: 77 %
FEV6-%Pred-Pre: 76 %
FEV6-Pre: 3.07 L
FEV6FVC-%Pred-Pre: 101 %
FVC-%PRED-PRE: 75 %
FVC-Pre: 3.09 L
Pre FEV1/FVC ratio: 65 %
Pre FEV6/FVC Ratio: 99 %

## 2015-01-28 MED ORDER — PERFLUTREN LIPID MICROSPHERE
INTRAVENOUS | Status: AC
  Filled 2015-01-28: qty 10

## 2015-01-28 NOTE — Progress Notes (Signed)
  Echocardiogram 2D Echocardiogram with Definity has been performed.  Laura Rowland 01/28/2015, 3:04 PM

## 2015-02-01 MED FILL — Perflutren Lipid Microsphere IV Susp 1.1 MG/ML: INTRAVENOUS | Qty: 10 | Status: AC

## 2015-02-15 DIAGNOSIS — M3489 Other systemic sclerosis: Secondary | ICD-10-CM | POA: Diagnosis not present

## 2015-02-15 DIAGNOSIS — Z79899 Other long term (current) drug therapy: Secondary | ICD-10-CM | POA: Diagnosis not present

## 2015-03-11 DIAGNOSIS — N926 Irregular menstruation, unspecified: Secondary | ICD-10-CM | POA: Diagnosis not present

## 2015-03-23 DIAGNOSIS — N926 Irregular menstruation, unspecified: Secondary | ICD-10-CM | POA: Diagnosis not present

## 2015-03-23 DIAGNOSIS — N83202 Unspecified ovarian cyst, left side: Secondary | ICD-10-CM | POA: Diagnosis not present

## 2015-03-24 DIAGNOSIS — L659 Nonscarring hair loss, unspecified: Secondary | ICD-10-CM | POA: Diagnosis not present

## 2015-03-24 DIAGNOSIS — L669 Cicatricial alopecia, unspecified: Secondary | ICD-10-CM | POA: Diagnosis not present

## 2015-05-03 DIAGNOSIS — J209 Acute bronchitis, unspecified: Secondary | ICD-10-CM | POA: Diagnosis not present

## 2015-05-03 DIAGNOSIS — M349 Systemic sclerosis, unspecified: Secondary | ICD-10-CM | POA: Diagnosis not present

## 2015-05-12 DIAGNOSIS — I272 Other secondary pulmonary hypertension: Secondary | ICD-10-CM | POA: Diagnosis not present

## 2015-05-12 DIAGNOSIS — M940 Chondrocostal junction syndrome [Tietze]: Secondary | ICD-10-CM | POA: Diagnosis not present

## 2015-05-12 DIAGNOSIS — J8489 Other specified interstitial pulmonary diseases: Secondary | ICD-10-CM | POA: Diagnosis not present

## 2015-05-12 DIAGNOSIS — O0281 Inappropriate change in quantitative human chorionic gonadotropin (hCG) in early pregnancy: Secondary | ICD-10-CM | POA: Diagnosis not present

## 2015-05-12 DIAGNOSIS — L639 Alopecia areata, unspecified: Secondary | ICD-10-CM | POA: Diagnosis not present

## 2015-05-12 DIAGNOSIS — I270 Primary pulmonary hypertension: Secondary | ICD-10-CM | POA: Diagnosis not present

## 2015-05-12 DIAGNOSIS — M3489 Other systemic sclerosis: Secondary | ICD-10-CM | POA: Diagnosis not present

## 2015-05-12 DIAGNOSIS — I509 Heart failure, unspecified: Secondary | ICD-10-CM | POA: Diagnosis not present

## 2015-05-12 DIAGNOSIS — E139 Other specified diabetes mellitus without complications: Secondary | ICD-10-CM | POA: Diagnosis not present

## 2015-05-12 DIAGNOSIS — J8417 Other interstitial pulmonary diseases with fibrosis in diseases classified elsewhere: Secondary | ICD-10-CM | POA: Diagnosis not present

## 2015-05-18 DIAGNOSIS — J45901 Unspecified asthma with (acute) exacerbation: Secondary | ICD-10-CM | POA: Diagnosis not present

## 2015-05-19 DIAGNOSIS — Z3480 Encounter for supervision of other normal pregnancy, unspecified trimester: Secondary | ICD-10-CM | POA: Diagnosis not present

## 2015-05-19 LAB — OB RESULTS CONSOLE HEPATITIS B SURFACE ANTIGEN: Hepatitis B Surface Ag: NEGATIVE

## 2015-05-19 LAB — OB RESULTS CONSOLE HIV ANTIBODY (ROUTINE TESTING): HIV: NONREACTIVE

## 2015-05-19 LAB — OB RESULTS CONSOLE RUBELLA ANTIBODY, IGM: Rubella: IMMUNE

## 2015-05-19 LAB — OB RESULTS CONSOLE ABO/RH: RH Type: POSITIVE

## 2015-05-25 DIAGNOSIS — Z3A01 Less than 8 weeks gestation of pregnancy: Secondary | ICD-10-CM | POA: Diagnosis not present

## 2015-05-25 DIAGNOSIS — O09291 Supervision of pregnancy with other poor reproductive or obstetric history, first trimester: Secondary | ICD-10-CM | POA: Diagnosis not present

## 2015-05-25 DIAGNOSIS — O09521 Supervision of elderly multigravida, first trimester: Secondary | ICD-10-CM | POA: Diagnosis not present

## 2015-05-25 DIAGNOSIS — O26899 Other specified pregnancy related conditions, unspecified trimester: Secondary | ICD-10-CM | POA: Diagnosis not present

## 2015-06-17 ENCOUNTER — Institutional Professional Consult (permissible substitution): Payer: Medicare Other | Admitting: Internal Medicine

## 2015-06-22 ENCOUNTER — Ambulatory Visit (INDEPENDENT_AMBULATORY_CARE_PROVIDER_SITE_OTHER): Payer: Medicare HMO | Admitting: Internal Medicine

## 2015-06-22 ENCOUNTER — Encounter: Payer: Self-pay | Admitting: Internal Medicine

## 2015-06-22 ENCOUNTER — Other Ambulatory Visit (HOSPITAL_COMMUNITY): Payer: Self-pay | Admitting: Obstetrics and Gynecology

## 2015-06-22 VITALS — BP 126/74 | HR 83 | Ht 72.0 in | Wt 268.4 lb

## 2015-06-22 DIAGNOSIS — M349 Systemic sclerosis, unspecified: Secondary | ICD-10-CM | POA: Diagnosis not present

## 2015-06-22 DIAGNOSIS — J4530 Mild persistent asthma, uncomplicated: Secondary | ICD-10-CM | POA: Diagnosis not present

## 2015-06-22 DIAGNOSIS — IMO0002 Reserved for concepts with insufficient information to code with codable children: Secondary | ICD-10-CM

## 2015-06-22 MED ORDER — ESOMEPRAZOLE MAGNESIUM 40 MG PO CPDR: DELAYED_RELEASE_CAPSULE | ORAL | Status: AC

## 2015-06-22 NOTE — Patient Instructions (Signed)
Pace yourself and walk slower to avoid becoming too short of breath as this is not healthy for oxygen delivery to the baby's oxygen level  Only use your albuterol as a rescue medication to be used if you can't catch your breath by resting or doing a relaxed purse lip breathing pattern.  - The less you use it, the better it will work when you need it. - Ok to use up to 2 puffs  every 4 hours if you must but call for immediate appointment if use goes up over your usual need - Don't leave home without it !!  (think of it like the spare tire for your car)   Change nexium to Take 30- 60 min before your first and last meals of the day   GERD (REFLUX)  is an extremely common cause of respiratory symptoms just like yours , many times with no obvious heartburn at all.    It can be treated with medication, but also with lifestyle changes including elevation of the head of your bed (ideally with 6 inch  bed blocks),  Smoking cessation, avoidance of late meals, excessive alcohol, and avoid fatty foods, chocolate, peppermint, colas, red wine, and acidic juices such as orange juice.  NO MINT OR MENTHOL PRODUCTS SO NO COUGH DROPS  USE SUGARLESS CANDY INSTEAD (Jolley ranchers or Stover's or Life Savers) or even ice chips will also do - the key is to swallow to prevent all throat clearing. NO OIL BASED VITAMINS - use powdered substitutes.    Please schedule a follow up office visit in 4 weeks, sooner if needed and we will consider starting you on a new maintenance inhaler once organogenesis is complete

## 2015-06-22 NOTE — Assessment & Plan Note (Signed)
Initial eval [facility] 2007 - f/u Dr Chilton Greathouse  - HRCT 07/08/13 1. Marked progression of interstitial lung disease. Given the strong craniocaudal gradient, marked progression compared to prior study from 08/03/2004, and presence of honeycombing in the lower lobes of the lungs bilaterally, this pattern is compatible with usual interstitial pneumonia (UIP), presumably a manifestation of the patient's underlying scleroderma. 2. Dilated pulmonic trunk (4 cm in diameter), suggestive of pulmonary arterial hypertension. 3. Dilated esophagus presumably secondary to scleroderma. - Echo 01/28/15 s [facility] - 06/22/2015  Walked RA x 3 laps @ 185 ft each stopped due to end of study, nl pace, no significant desat or sob.   Main concern is progressive restrictive change and risk of worsening gerd with pregnancy which will be high risk in this setting   For now rec max rx for gerd with ppi bid ac and diet/ f/u q 4 weeks  Total time devoted to counseling  = 35/12m review case with pt/ discussion of options/alternatives/ personally creating written instructions  in presence of pt  then going over those specific  Instructions directly with the pt including how to use all of the meds but in particular covering each new medication in detail and the difference between the maintenance/automatic meds and the prns using an action plan format for the latter.

## 2015-06-22 NOTE — Progress Notes (Signed)
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 Asthma     06/22/2015 1st [place] [facility] office visit/ Xoe Hoe  @ [redacted] weeks gestation on ppi bid but not ac and saba but no longer ICS  Chief Complaint  Patient presents with  . Advice Only    Referred by [name]; Shortness of breath, thinks she may have asthma.   for long as she can remember when cold gets in chest gets severe symptoms of bad cough by codeine goes away eventually recurred in Dec 2016 > UC with cough/ sob / lost voice dx as asthma rx pred/proair and 100% better an no maint then March 2017 same thing  > UC eval rx one treatment and this resolved but started needing saba more but mostly just with exertion maybe once a day and never noct/ or early. Also loses breath talking.  saba helps  At baseline sob walking entire grocery store, never tried the saba first/ avg saba once daily Since onset IUP = slt more sob/ no increase in saba use   No obvious  Day to day or daytime variability or assoc excess/ purulent sputum or mucus plugs or hemoptysis or cp or chest tightness, subjective wheeze or overt sinus or hb symptoms. No unusual exp hx or h/o childhood pna/ asthma or knowledge of premature birth.  Sleeping ok without nocturnal  or early am exacerbation  of respiratory  c/o's or need for noct saba. Also denies any obvious fluctuation of symptoms with weather or environmental changes or other aggravating or alleviating factors except as outlined above   Current Medications, Allergies, Complete Past Medical History, Past Surgical History, Family History, and Social History were reviewed in [employer] record.  ROS  The following are not active complaints unless bolded sore throat, dysphagia, dental problems, itching, sneezing,  nasal congestion or excess/ purulent secretions, ear ache,   fever, chills, sweats, unintended wt loss, classically pleuritic or exertional cp,  orthopnea pnd or leg swelling, presyncope, palpitations, abdominal pain, anorexia, nausea, vomiting, diarrhea  or change in bowel or bladder habits, change in stools or urine, dysuria,hematuria,  rash, arthralgias, visual complaints, headache, numbness, weakness or ataxia or problems with walking or coordination,  change in mood/affect or memory.            Review of Systems  Constitutional: Negative for fever, chills and unexpected weight change.  HENT: Negative for congestion, dental problem, ear pain, nosebleeds, postnasal drip, rhinorrhea, sinus pressure, sneezing, sore throat, trouble swallowing and voice change.   Eyes: Negative for visual disturbance.  Respiratory: Positive for shortness of breath. Negative for choking.   Cardiovascular: Negative for chest pain and leg swelling.  Gastrointestinal: Negative for vomiting, abdominal pain and diarrhea.  Genitourinary: Negative for difficulty urinating.  Musculoskeletal: Negative for arthralgias.  Skin: Negative for rash.  Neurological: Positive for headaches. Negative for tremors and syncope.  Hematological: Does not bruise/bleed easily.       Objective:   Physical Exam  amb bf typical scleroderma facies  Wt Readings from Last 3 Encounters:  06/22/15 268 lb 6.4 oz (121.745 kg)  06/24/14  250 lb (113.399 kg)  11/03/12 243 lb (110.224 kg)    Vital signs reviewed   HEENT: nl dentition, turbinates, and oropharynx. Nl external ear canals without cough reflex   NECK :  without JVD/Nodes/TM/ nl carotid upstrokes bilaterally   LUNGS: no acc muscle use,  Nl contour chest with minimal insp crackles in bases, no exp wheeze and no cough on insp or  exp  CV:  RRR  no s3 or murmur or increase in P2, no edema   ABD:  soft and nontender with nl inspiratory excursion in the supine position. No bruits or organomegaly, bowel sounds nl  MS:  Nl gait/ ext warm without deformities, calf tenderness, cyanosis or clubbing No obvious joint restrictions   SKIN: warm and dry without lesions    NEURO:  alert, approp, nl sensorium with  no motor deficits            Assessment & Plan:

## 2015-06-22 NOTE — Assessment & Plan Note (Signed)
PFT's  01/28/15    FEV1 2.02 (60 % ) ratio 65   with DLCO  47 % corrects to 68 % for alv volume    Would benefit from addition of maint rx with laba/ics but @ 11 weeks the risks > benefits which probably would favor rx in 4 weeks p organogenesis is complete depending on the setting which I will re-eval at that point in terms of symptom control and saba dep

## 2015-06-23 DIAGNOSIS — Z3A11 11 weeks gestation of pregnancy: Secondary | ICD-10-CM | POA: Diagnosis not present

## 2015-06-28 ENCOUNTER — Ambulatory Visit (HOSPITAL_COMMUNITY): Payer: Medicare HMO

## 2015-06-28 ENCOUNTER — Ambulatory Visit (HOSPITAL_COMMUNITY)
Admission: RE | Admit: 2015-06-28 | Discharge: 2015-06-28 | Disposition: A | Discharge status: Home / Self Care | Payer: Medicare HMO | Source: Ambulatory Visit | Attending: Obstetrics and Gynecology | Admitting: Obstetrics and Gynecology

## 2015-06-28 ENCOUNTER — Encounter (HOSPITAL_COMMUNITY): Payer: Self-pay

## 2015-06-28 VITALS — BP 119/61 | HR 92 | Wt 267.4 lb

## 2015-06-28 DIAGNOSIS — O26891 Other specified pregnancy related conditions, first trimester: Secondary | ICD-10-CM | POA: Insufficient documentation

## 2015-06-28 DIAGNOSIS — Z3A12 12 weeks gestation of pregnancy: Secondary | ICD-10-CM | POA: Diagnosis not present

## 2015-06-28 DIAGNOSIS — Z315 Encounter for genetic counseling: Secondary | ICD-10-CM | POA: Diagnosis not present

## 2015-06-28 DIAGNOSIS — O09529 Supervision of elderly multigravida, unspecified trimester: Secondary | ICD-10-CM

## 2015-06-28 DIAGNOSIS — M349 Systemic sclerosis, unspecified: Secondary | ICD-10-CM | POA: Insufficient documentation

## 2015-06-28 DIAGNOSIS — O9989 Other specified diseases and conditions complicating pregnancy, childbirth and the puerperium: Secondary | ICD-10-CM | POA: Diagnosis not present

## 2015-06-28 DIAGNOSIS — L940 Localized scleroderma [morphea]: Secondary | ICD-10-CM | POA: Diagnosis not present

## 2015-06-28 DIAGNOSIS — O09521 Supervision of elderly multigravida, first trimester: Secondary | ICD-10-CM | POA: Diagnosis not present

## 2015-06-28 NOTE — Progress Notes (Signed)
Genetic Counseling  High-Risk Gestation Note  Appointment Date:  06/28/2015 Referred By: Christophe Louis, MD Date of Birth:  06-12-1975 Partner:  Leron Croak   Pregnancy [id number] Estimated Date of Delivery: 01/06/16 Estimated Gestational Age: [redacted]w[redacted]d Attending: Jolyn Lent, MD   [name] was seen for genetic counseling because of a maternal age of 40 y.o..     In summary:  Discussed AMA and associated risk for fetal aneuploidy  Discussed options for screening  First screen-declined  Quad screen-declined  NIPS-patient wants to pursue, but wants to first check on insurance coverage  Ultrasound-detailed ultrasound scheduled for 08/09/15  Discussed diagnostic testing options  Amniocentesis- would consider pending results of NIPS  Reviewed family history concerns  MFM consult today given patient's diagnosis of scleroderma; See separate MFM consult note  She was counseled regarding maternal age and the association with risk for chromosome conditions due to nondisjunction with aging of the ova.   We reviewed chromosomes, nondisjunction, and the associated 1 in 3 risk for fetal aneuploidy at [redacted]w[redacted]d related to a maternal age of 40 years old at delivery.  She was counseled that the risk for aneuploidy decreases as gestational age increases, accounting for those pregnancies which spontaneously abort.  We specifically discussed Down syndrome (trisomy 58), trisomies 76 and 7, and sex chromosome aneuploidies (47,XXX and 47,XXY) including the common features and prognoses of each.   We reviewed available screening options including First Screen, Quad screen, noninvasive prenatal screening (NIPS)/cell free DNA (cfDNA) screening, and detailed ultrasound.  She was counseled that screening tests are used to modify a patient's a priori risk for aneuploidy, typically based on age. This estimate provides a pregnancy specific risk assessment. We reviewed the benefits and limitations of  each option. Specifically, we discussed the conditions for which each test screens, the detection rates, and false positive rates of each. She was also counseled regarding diagnostic testing via CVS and amniocentesis. We reviewed the approximate 1 in 123XX123 risk for complications for CVS and the approximate 1 in 99991111 risk for complications from amniocentesis, including spontaneous pregnancy loss. We discussed the possible results that the tests might provide including: positive, negative, unanticipated, and no result. Finally, they were counseled regarding the cost of each option and potential out of pocket expenses.  After consideration of all the options, she expressed interest in pursuing NIPS but first wanted to further investigate insurance coverage of this option. I planned to contact the patient once additional information was obtained from the laboratory providing the test about possible coverage under her insurance. Ms. Laura Rowland stated that she would consider amniocentesis but would prefer to consider this as an option pending the results of NIPS. She declined diagnostic testing at this time. No screening or testing for fetal aneuploidy was performed today given that the patient is awaiting additional information regarding insurance coverage for NIPS. Detailed ultrasound is scheduled for 08/09/15.  She understands that screening tests cannot rule out all birth defects or genetic syndromes.   [name] was provided with written information regarding sickle cell anemia (SCA) including the carrier frequency and incidence in the African-American population, the availability of carrier testing and prenatal diagnosis if indicated.  In addition, we discussed that hemoglobinopathies are routinely screened for as part of the [place] newborn screening panel.  She declined hemoglobin electrophoresis today. OB medical records indicated that she previously had screening for sickle cell trait, which was within  normal limits.   Both family histories were reviewed and  found to be contributory for epilepsy of unknown etiology for the father of the pregnancy. Epilepsy occurs in approximately 1% of the population and can have many causes.  Approximately 80% of epilepsy is thought to be idiopathic while the remaining 20% is secondary to a variety of factors such as perinatal events, infections, trauma and genetic disease.  A specific diagnosis in an affected individual is necessary to accurately assess the risk for other family members to develop epilepsy.  In the absence of a known etiology, epilepsy is thought to be caused by a combination of genetic and environmental factors, called multifactorial inheritance. Recurrence risk for epilepsy is estimated to be 4% for offspring of an individual with primary idiopathic epilepsy. It would be important for the couple's pediatrician to be aware of this history so that their child(ren) can be screened and followed appropriately.   Additionally, the father of the pregnancy reportedly has a niece and nephew (his sister's children) with autism. These individuals are 39 and 38 years old, and no etiology is known regarding their autism. They were not described to have additional medical concerns nor dysmorphic features. We discussed that autism is part of the spectrum of conditions referred to as Autistic spectrum disorders (ASD). We discussed that ASDs are among the most common neurodevelopmental disorders, with approximately 1 in 68 children meeting criteria for ASD, according to the Centers for Disease Control. Approximately 80% of individuals diagnosed are female. There is strong evidence that genetic factors play a critical role in development of ASD. There have been recent advances in identifying specific genetic causes of ASD, however, there are still many individuals for whom the etiology of the ASD is not known. The majority of individuals with ASD (70-80%) have essential  autism. There is strong evidence that genetic factors play a critical role in development of ASD. Some individuals with ASDs are found to have causative differences in karyotype analysis, chromosomal microarray analysis, or single genes. These are more likely to be identified in individuals with complex autism spectrum disorders.  We discussed that data regarding recurrence risk estimate for extended relatives are currently limited but that increasing degree of relation would lower the risk for recurrence.  In the absence of an identified genetic etiology, prenatal screening or testing would not be available in the current pregnancy for the autism spectrum disorders in the family. Without further information regarding the provided family history, an accurate genetic risk cannot be calculated. Further genetic counseling is warranted if more information is obtained.  Mrs. RONA HILDITCH  denied exposure to environmental toxins or chemical agents. She denied the use of alcohol, tobacco or street drugs. She denied significant viral illnesses during the course of her pregnancy. Her medical and surgical histories were contributory for scleroderma. See separate MFM consult note from today's visit for detailed discussion regarding patient's medical history and the current pregnancy.    I counseled  [name] regarding the above risks and available options.  The approximate face-to-face time with the genetic counselor was 40 minutes.  Chipper Oman, MS,  Certified Genetic Counselor 06/28/2015

## 2015-06-28 NOTE — Progress Notes (Signed)
MATERNAL FETAL MEDICINE CONSULT  Patient Name: Laura Rowland Medical Record Number:  [id number] Date of Birth: Oct 08, 1975 Requesting Physician Name:  Laura Louis, MD Date of Service: 06/28/2015  Chief Complaint Scleroderma  History of Present Illness Laura Rowland was seen today secondary to scleroderma at the request of Laura Louis, MD.  The patient is a 40 y.o. [id number] [redacted]w[redacted]d with an EDD of 01/06/2016, by Last Menstrual Period dating method.  Laura Rowland was diagnosed with scleroderma in 2006 soon after the delivery of her first child.  She has been seen by Dr. Boris Lown, a Rheumatologist in [place], for this issue.  The scleroderma has been well controlled since approximately 2008.  She is currently taking 200 mg of hydroxychloroquine tid.  Her predominant symptoms are joint related.  She has no residual kidney or lung dysfunction.  She has not had significant symptoms during pregnancy.  She has a history of preterm labor at 34 weeks that lead to an emergency cesarean due to fetal distress.  Review of Systems Pertinent items are noted in HPI.  Patient History OB History  Gravida Para Term Preterm AB SAB TAB Ectopic Multiple Living  4 1  1 2 1 1   2     # Outcome Date GA Lbr Len/2nd Weight Sex Delivery Anes PTL Lv  4 Current           3 TAB           2 SAB           1 Preterm     F CS-LTranv  N Y      Past Medical History  Diagnosis Date  . Anemia   . Scleredema [facility])     Past Surgical History  Procedure Laterality Date  . Cesarean section    . Breast cyst excision  1996  . Cyst removal hand  2008  . Dilation and evacuation N/A 06/28/2014    Procedure: DILATATION AND EVACUATION;  Surgeon: Laura Louis, MD;  Location: [facility];  Service: Gynecology;  Laterality: N/A;  . Wisdom tooth extraction      Social History   Social History  . Marital Status: Married    Spouse Name: N/A  . Number of Children: N/A  . Years of Education: N/A   Social History Main Topics  . Smoking status:  Never Smoker   . Smokeless tobacco: Never Used  . Alcohol Use: No     Comment: rarely  . Drug Use: No  . Sexual Activity: Yes    Birth Control/ Protection: None   Other Topics Concern  . None   Social History Narrative    Family History  Problem Relation Age of Onset  . Diabetes Father    In addition, the patient has no family history of mental retardation, birth defects, or genetic diseases.  Physical Examination Filed Vitals:   06/28/15 1521  BP: 119/61  Pulse: 92   General appearance - alert, well appearing, and in no distress Mental status - alert, oriented to person, place, and time Extremities - no pedal edema noted.  There is deformity of several of her fingers and toes related to scleroderma.  Assessment and Recommendations 1.  Scleroderma.  Scleroderma and other autoimmune diseases can lead to nephritis and hypertension which can arise for the first time or become exacerbated in pregnancy.  To establish a baseline for the patient a CBC, CMP, and a 24 hour urine collection and serum creatinine should be performed.  These labs  should be repeated each trimester and as clinically indicated based on disease symptoms or the appearance of hypertension.  In general autoimmune disease improve during pregnancy, but there is an increased risk of a disease flair in the first 6 weeks after delivery.  It is reasonable to prophylactically start prednisone or another immunosuppressant after delivery to prevent such a flair.  I will leave this decision to the discretion of Dr. Boris Lown.  Laura Rowland is uncertain if she has been tested for SS/A or SS/B antibodies.  We will obtain records from her rheumatologist to determine if this has been done.  If not they should be drawn.  The presence of SS/A or SS/B atnibodies carries approximately a 10% risk of congenital heart block.  If these antibodies are present she should be seen once a week after 23 weeks to assess for fetal bradycardia or other  arrhythmia via bedside doppler and if present an obstetrical ultrasound should be obtained.  She will also need a fetal echocardiogram performed at approximately 23 weeks as well.  Finally, as autoimmune diseases are associated with fetal growth restriction the patient should have serial growth scans every 4-6 weeks after her anatomy scan at approximately 18 weeks.  Once or twice weekly fetal surveillance should be started at 32 weeks or sooner if fetal growth restriction is found or if congenital heart block develops.   I spent 30 minutes with Laura Rowland today of which 50% was face-to-face counseling.  Thank you for referring Laura Rowland to the [facility].  Please do not hesitate to contact us with questions.   Jolyn Lent, MD

## 2015-07-05 ENCOUNTER — Ambulatory Visit (HOSPITAL_COMMUNITY)
Admission: RE | Admit: 2015-07-05 | Discharge: 2015-07-05 | Disposition: A | Discharge status: Home / Self Care | Payer: Medicare HMO | Source: Ambulatory Visit | Attending: Obstetrics and Gynecology | Admitting: Obstetrics and Gynecology

## 2015-07-05 DIAGNOSIS — O09521 Supervision of elderly multigravida, first trimester: Secondary | ICD-10-CM | POA: Diagnosis not present

## 2015-07-12 ENCOUNTER — Telehealth (HOSPITAL_COMMUNITY): Payer: Self-pay | Admitting: MS"

## 2015-07-12 NOTE — Telephone Encounter (Signed)
Called Mrs. Arlyss Gandy regarding noninvasive prenatal screening/cell free DNA testing. Mrs. Angelillo had [facility] attempted through [facility] laboratory. Discussed with Mrs. Exantus that the sample had a low fetal fraction (2.4%) and thus, no result was obtained from this screening.  A redraw after one non-reportable NIPS result has an approximate 50% chance of obtaining a result. We also discussed the options of other screening methodologies including Quad screen and detailed ultrasound. Also reviewed the diagnostic testing option of amniocentesis. The patient stated that she is out of town and plans to call back on Monday to further discuss what additional testing, if any, she would like to pursue.   Santiago Glad Jhoselin Crume 07/12/2015 4:49 PM

## 2015-07-12 NOTE — Telephone Encounter (Signed)
Attempted to call patient regarding Panor[facility] lab. Left message for patient to return call.   Laura Rowland 07/12/2015 2:58 PM

## 2015-07-14 ENCOUNTER — Other Ambulatory Visit (HOSPITAL_COMMUNITY): Payer: Self-pay

## 2015-07-19 DIAGNOSIS — O09522 Supervision of elderly multigravida, second trimester: Secondary | ICD-10-CM | POA: Diagnosis not present

## 2015-07-19 DIAGNOSIS — M349 Systemic sclerosis, unspecified: Secondary | ICD-10-CM | POA: Diagnosis not present

## 2015-07-21 ENCOUNTER — Encounter: Payer: Self-pay | Admitting: Internal Medicine

## 2015-07-21 ENCOUNTER — Ambulatory Visit (INDEPENDENT_AMBULATORY_CARE_PROVIDER_SITE_OTHER): Payer: Medicare HMO | Admitting: Internal Medicine

## 2015-07-21 VITALS — BP 112/70 | HR 82 | Ht 72.0 in | Wt 263.0 lb

## 2015-07-21 DIAGNOSIS — M349 Systemic sclerosis, unspecified: Secondary | ICD-10-CM

## 2015-07-21 DIAGNOSIS — J4530 Mild persistent asthma, uncomplicated: Secondary | ICD-10-CM | POA: Diagnosis not present

## 2015-07-21 NOTE — Progress Notes (Signed)
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 Asthma     06/22/2015 1st [place] [facility] office visit/ Laura Rowland  @ [redacted] weeks gestation on ppi bid but not ac and saba but no longer ICS  Chief Complaint  Patient presents with  . Advice Only    Referred by Laura Rowland; Shortness of breath, thinks she may have asthma.   for long as she can remember when cold gets in chest gets severe symptoms of bad cough by codeine goes away eventually recurred in Dec 2016 > UC with cough/ sob / lost voice dx as asthma rx pred/proair and 100% better an no maint then March 2017 same thing  > UC eval rx one treatment and this resolved but started needing saba more but mostly just with exertion maybe once a day and never noct/ or early. Also loses breath talking.  saba helps  At baseline sob walking entire grocery store, never tried the saba first/ avg saba once daily Since onset IUP = slt more sob/ no increase in saba use  rec Pace yourself and walk slower to avoid becoming too short of breath as this is not healthy for oxygen delivery to the baby's oxygen level Only use your albuterol as a rescue medication Change nexium to Take 30- 60 min before your first and last meals of the day  GERD diet     07/21/2015  f/u ov/Laura Rowland re:   IUP 16 weeks / mild asthma/ no saba need since last ov on gerd rx  Chief Complaint  Patient presents with  . Follow-up     Has learned to pace herself and no long sob  No obvious  Day to day or daytime variability or assoc excess/ purulent sputum or mucus plugs or hemoptysis or cp or chest tightness, subjective wheeze or overt sinus or hb  symptoms. No unusual exp hx or h/o childhood pna/ asthma or knowledge of premature birth.  Sleeping ok without nocturnal  or early am exacerbation  of respiratory  c/o's or need for noct saba. Also denies any obvious fluctuation of symptoms with weather or environmental changes or other aggravating or alleviating factors except as outlined above   Current Medications, Allergies, Complete Past Medical History, Past Surgical History, Family History, and Social History were reviewed in [employer] record.  ROS  The following are not active complaints unless bolded sore throat, dysphagia, dental problems, itching, sneezing,  nasal congestion or excess/ purulent secretions, ear ache,   fever, chills, sweats, unintended wt loss, classically pleuritic or exertional cp,  orthopnea pnd or leg swelling, presyncope, palpitations, abdominal pain, anorexia, nausea, vomiting, diarrhea  or change in bowel or bladder habits, change in stools or urine, dysuria,hematuria,  rash, arthralgias, visual complaints, headache, numbness, weakness or ataxia or problems with walking or coordination,  change in mood/affect or memory.                 Objective:   Physical Exam  amb bf typical scleroderma facies  Wt Readings from Last 3 Encounters:  06/22/15 268 lb 6.4 oz (121.745 kg)  06/24/14 250 lb (113.399 kg)  11/03/12 243 lb (110.224 kg)    Vital signs reviewed   HEENT: nl dentition, turbinates, and oropharynx. Nl external ear canals without cough reflex   NECK :  without JVD/Nodes/TM/ nl carotid upstrokes bilaterally   LUNGS: no acc muscle use,  Nl contour chest with minimal insp crackles in bases, no exp wheeze and no cough on insp or exp  CV:  RRR  no s3 or murmur or increase in P2, no edema   ABD:  soft and nontender with nl inspiratory excursion in the supine position. No bruits or organomegaly, bowel sounds nl  MS:  Nl gait/ ext warm without deformities, calf tenderness,  cyanosis or clubbing No obvious joint restrictions   SKIN: warm and dry without lesions    NEURO:  alert, approp, nl sensorium with  no motor deficits            Assessment & Plan:

## 2015-07-21 NOTE — Patient Instructions (Signed)
No change in recommendations  Please schedule a follow up office visit in 6 weeks, call sooner if needed

## 2015-07-22 DIAGNOSIS — O09212 Supervision of pregnancy with history of pre-term labor, second trimester: Secondary | ICD-10-CM | POA: Diagnosis not present

## 2015-07-22 NOTE — Assessment & Plan Note (Signed)
Initial eval [facility] 2007 - f/u Dr Chilton Greathouse  - HRCT 07/08/13 1. Marked progression of interstitial lung disease. Given the strong craniocaudal gradient, marked progression compared to prior study from 08/03/2004, and presence of honeycombing in the lower lobes of the lungs bilaterally, this pattern is compatible with usual interstitial pneumonia (UIP), presumably a manifestation of the patient's underlying scleroderma. 2. Dilated pulmonic trunk (4 cm in diameter), suggestive of pulmonary arterial hypertension. 3. Dilated esophagus presumably secondary to scleroderma. - Echo 01/28/15 s [facility] - 06/22/2015  Walked RA x 3 laps @ 185 ft each stopped due to end of study, nl pace, no significant desat or sob.  - symptoms improved on gerd rx 07/21/2015 despite additional month of gestation  Continue plaquenil/ max gerd rx.

## 2015-07-22 NOTE — Assessment & Plan Note (Signed)
PFT's  01/28/15    FEV1 2.02 (60 % ) ratio 65   with DLCO  47 % corrects to 68 % for alv volume  All goals of chronic asthma control met including optimal(though not likely nl)  function and elimination of symptoms with minimal need for rescue therapy.  Contingencies discussed in full including contacting this office immediately if not controlling the symptoms using the rule of two's.     Discussed in detail all the  indications, usual  risks and alternatives  relative to the benefits with patient who agrees to proceed with conservative f/u as outlined  Rather than risk additional side effects in terms of fetal impact.  I had an extended discussion with the patient reviewing all relevant studies completed to date and  lasting 15 to 20 minutes of a 25 minute visit    Each maintenance medication was reviewed in detail including most importantly the difference between maintenance and prns and under what circumstances the prns are to be triggered using an action plan format that is not reflected in the computer generated alphabetically organized AVS.    Please see instructions for details which were reviewed in writing and the patient given a copy highlighting the part that I personally wrote and discussed at today's ov.

## 2015-07-29 DIAGNOSIS — O09212 Supervision of pregnancy with history of pre-term labor, second trimester: Secondary | ICD-10-CM | POA: Diagnosis not present

## 2015-07-29 DIAGNOSIS — R300 Dysuria: Secondary | ICD-10-CM | POA: Diagnosis not present

## 2015-07-29 DIAGNOSIS — O26899 Other specified pregnancy related conditions, unspecified trimester: Secondary | ICD-10-CM | POA: Diagnosis not present

## 2015-08-05 DIAGNOSIS — O09212 Supervision of pregnancy with history of pre-term labor, second trimester: Secondary | ICD-10-CM | POA: Diagnosis not present

## 2015-08-09 ENCOUNTER — Encounter (HOSPITAL_COMMUNITY): Payer: Self-pay

## 2015-08-09 ENCOUNTER — Ambulatory Visit (HOSPITAL_COMMUNITY)
Admission: RE | Admit: 2015-08-09 | Discharge: 2015-08-09 | Disposition: A | Discharge status: Home / Self Care | Payer: Medicare HMO | Source: Ambulatory Visit | Attending: Obstetrics and Gynecology | Admitting: Obstetrics and Gynecology

## 2015-08-09 ENCOUNTER — Other Ambulatory Visit (HOSPITAL_COMMUNITY): Payer: Self-pay | Admitting: Maternal and Fetal Medicine

## 2015-08-09 DIAGNOSIS — O09522 Supervision of elderly multigravida, second trimester: Secondary | ICD-10-CM | POA: Insufficient documentation

## 2015-08-09 DIAGNOSIS — Z3A18 18 weeks gestation of pregnancy: Secondary | ICD-10-CM

## 2015-08-09 DIAGNOSIS — O99512 Diseases of the respiratory system complicating pregnancy, second trimester: Secondary | ICD-10-CM | POA: Diagnosis not present

## 2015-08-09 DIAGNOSIS — O09529 Supervision of elderly multigravida, unspecified trimester: Secondary | ICD-10-CM

## 2015-08-09 DIAGNOSIS — Z3689 Encounter for other specified antenatal screening: Secondary | ICD-10-CM

## 2015-08-09 DIAGNOSIS — J45909 Unspecified asthma, uncomplicated: Secondary | ICD-10-CM | POA: Insufficient documentation

## 2015-08-09 DIAGNOSIS — O09292 Supervision of pregnancy with other poor reproductive or obstetric history, second trimester: Secondary | ICD-10-CM | POA: Insufficient documentation

## 2015-08-09 DIAGNOSIS — O34219 Maternal care for unspecified type scar from previous cesarean delivery: Secondary | ICD-10-CM | POA: Insufficient documentation

## 2015-08-09 DIAGNOSIS — Z36 Encounter for antenatal screening of mother: Secondary | ICD-10-CM | POA: Diagnosis not present

## 2015-08-09 DIAGNOSIS — O2692 Pregnancy related conditions, unspecified, second trimester: Secondary | ICD-10-CM | POA: Diagnosis not present

## 2015-08-12 DIAGNOSIS — O09212 Supervision of pregnancy with history of pre-term labor, second trimester: Secondary | ICD-10-CM | POA: Diagnosis not present

## 2015-08-19 DIAGNOSIS — O09212 Supervision of pregnancy with history of pre-term labor, second trimester: Secondary | ICD-10-CM | POA: Diagnosis not present

## 2015-08-26 DIAGNOSIS — O09212 Supervision of pregnancy with history of pre-term labor, second trimester: Secondary | ICD-10-CM | POA: Diagnosis not present

## 2015-08-29 ENCOUNTER — Other Ambulatory Visit (HOSPITAL_COMMUNITY): Payer: Self-pay

## 2015-08-29 ENCOUNTER — Encounter (HOSPITAL_COMMUNITY): Payer: Self-pay

## 2015-08-29 DIAGNOSIS — O09522 Supervision of elderly multigravida, second trimester: Secondary | ICD-10-CM | POA: Diagnosis not present

## 2015-08-29 DIAGNOSIS — R768 Other specified abnormal immunological findings in serum: Secondary | ICD-10-CM | POA: Diagnosis not present

## 2015-09-02 DIAGNOSIS — O09212 Supervision of pregnancy with history of pre-term labor, second trimester: Secondary | ICD-10-CM | POA: Diagnosis not present

## 2015-09-06 ENCOUNTER — Ambulatory Visit: Payer: Medicare HMO | Admitting: Internal Medicine

## 2015-09-09 DIAGNOSIS — O09212 Supervision of pregnancy with history of pre-term labor, second trimester: Secondary | ICD-10-CM | POA: Diagnosis not present

## 2015-09-13 DIAGNOSIS — L639 Alopecia areata, unspecified: Secondary | ICD-10-CM | POA: Diagnosis not present

## 2015-09-13 DIAGNOSIS — M3489 Other systemic sclerosis: Secondary | ICD-10-CM | POA: Diagnosis not present

## 2015-09-15 DIAGNOSIS — Z3A23 23 weeks gestation of pregnancy: Secondary | ICD-10-CM | POA: Diagnosis not present

## 2015-09-15 DIAGNOSIS — R768 Other specified abnormal immunological findings in serum: Secondary | ICD-10-CM | POA: Diagnosis not present

## 2015-09-15 DIAGNOSIS — M3489 Other systemic sclerosis: Secondary | ICD-10-CM | POA: Diagnosis not present

## 2015-09-15 DIAGNOSIS — O9989 Other specified diseases and conditions complicating pregnancy, childbirth and the puerperium: Secondary | ICD-10-CM | POA: Diagnosis not present

## 2015-09-16 DIAGNOSIS — O09212 Supervision of pregnancy with history of pre-term labor, second trimester: Secondary | ICD-10-CM | POA: Diagnosis not present

## 2015-09-19 ENCOUNTER — Encounter (HOSPITAL_COMMUNITY): Payer: Self-pay | Admitting: Radiology

## 2015-09-21 ENCOUNTER — Encounter (HOSPITAL_COMMUNITY): Payer: Self-pay

## 2015-09-21 ENCOUNTER — Ambulatory Visit (HOSPITAL_COMMUNITY)
Admission: RE | Admit: 2015-09-21 | Discharge: 2015-09-21 | Disposition: A | Discharge status: Home / Self Care | Payer: Medicare HMO | Source: Ambulatory Visit | Attending: Obstetrics and Gynecology | Admitting: Obstetrics and Gynecology

## 2015-09-21 DIAGNOSIS — O34219 Maternal care for unspecified type scar from previous cesarean delivery: Secondary | ICD-10-CM | POA: Insufficient documentation

## 2015-09-21 DIAGNOSIS — O09522 Supervision of elderly multigravida, second trimester: Secondary | ICD-10-CM | POA: Diagnosis not present

## 2015-09-21 DIAGNOSIS — O09292 Supervision of pregnancy with other poor reproductive or obstetric history, second trimester: Secondary | ICD-10-CM | POA: Diagnosis not present

## 2015-09-21 DIAGNOSIS — Z36 Encounter for antenatal screening of mother: Secondary | ICD-10-CM | POA: Diagnosis not present

## 2015-09-21 DIAGNOSIS — Z3A24 24 weeks gestation of pregnancy: Secondary | ICD-10-CM | POA: Insufficient documentation

## 2015-09-21 DIAGNOSIS — O09529 Supervision of elderly multigravida, unspecified trimester: Secondary | ICD-10-CM

## 2015-09-21 DIAGNOSIS — Z8739 Personal history of other diseases of the musculoskeletal system and connective tissue: Secondary | ICD-10-CM | POA: Diagnosis not present

## 2015-09-23 DIAGNOSIS — O09212 Supervision of pregnancy with history of pre-term labor, second trimester: Secondary | ICD-10-CM | POA: Diagnosis not present

## 2015-09-23 DIAGNOSIS — O34219 Maternal care for unspecified type scar from previous cesarean delivery: Secondary | ICD-10-CM | POA: Diagnosis not present

## 2015-09-27 DIAGNOSIS — L819 Disorder of pigmentation, unspecified: Secondary | ICD-10-CM | POA: Diagnosis not present

## 2015-09-27 DIAGNOSIS — L668 Other cicatricial alopecia: Secondary | ICD-10-CM | POA: Diagnosis not present

## 2015-09-29 ENCOUNTER — Encounter (HOSPITAL_COMMUNITY): Payer: Self-pay

## 2015-09-29 DIAGNOSIS — O9989 Other specified diseases and conditions complicating pregnancy, childbirth and the puerperium: Secondary | ICD-10-CM | POA: Diagnosis not present

## 2015-09-29 DIAGNOSIS — O09522 Supervision of elderly multigravida, second trimester: Secondary | ICD-10-CM | POA: Diagnosis not present

## 2015-09-29 DIAGNOSIS — R768 Other specified abnormal immunological findings in serum: Secondary | ICD-10-CM | POA: Diagnosis not present

## 2015-09-29 DIAGNOSIS — M349 Systemic sclerosis, unspecified: Secondary | ICD-10-CM | POA: Diagnosis not present

## 2015-09-29 DIAGNOSIS — Z3A25 25 weeks gestation of pregnancy: Secondary | ICD-10-CM | POA: Diagnosis not present

## 2015-09-30 DIAGNOSIS — O09212 Supervision of pregnancy with history of pre-term labor, second trimester: Secondary | ICD-10-CM | POA: Diagnosis not present

## 2015-10-07 DIAGNOSIS — O09293 Supervision of pregnancy with other poor reproductive or obstetric history, third trimester: Secondary | ICD-10-CM | POA: Diagnosis not present

## 2015-10-07 DIAGNOSIS — Z3480 Encounter for supervision of other normal pregnancy, unspecified trimester: Secondary | ICD-10-CM | POA: Diagnosis not present

## 2015-10-11 DIAGNOSIS — O9989 Other specified diseases and conditions complicating pregnancy, childbirth and the puerperium: Secondary | ICD-10-CM | POA: Diagnosis not present

## 2015-10-11 DIAGNOSIS — R768 Other specified abnormal immunological findings in serum: Secondary | ICD-10-CM | POA: Diagnosis not present

## 2015-10-11 DIAGNOSIS — Z3A27 27 weeks gestation of pregnancy: Secondary | ICD-10-CM | POA: Diagnosis not present

## 2015-10-11 DIAGNOSIS — M3489 Other systemic sclerosis: Secondary | ICD-10-CM | POA: Diagnosis not present

## 2015-10-13 ENCOUNTER — Encounter (HOSPITAL_COMMUNITY): Payer: Self-pay

## 2015-10-14 DIAGNOSIS — O09212 Supervision of pregnancy with history of pre-term labor, second trimester: Secondary | ICD-10-CM | POA: Diagnosis not present

## 2015-10-19 ENCOUNTER — Encounter (HOSPITAL_COMMUNITY): Payer: Self-pay

## 2015-10-19 ENCOUNTER — Other Ambulatory Visit (HOSPITAL_COMMUNITY): Payer: Self-pay | Admitting: Maternal and Fetal Medicine

## 2015-10-19 ENCOUNTER — Ambulatory Visit (HOSPITAL_COMMUNITY)
Admission: RE | Admit: 2015-10-19 | Discharge: 2015-10-19 | Disposition: A | Discharge status: Home / Self Care | Payer: Medicare HMO | Source: Ambulatory Visit | Attending: Obstetrics and Gynecology | Admitting: Obstetrics and Gynecology

## 2015-10-19 DIAGNOSIS — Z3A28 28 weeks gestation of pregnancy: Secondary | ICD-10-CM

## 2015-10-19 DIAGNOSIS — O09213 Supervision of pregnancy with history of pre-term labor, third trimester: Secondary | ICD-10-CM | POA: Diagnosis not present

## 2015-10-19 DIAGNOSIS — O99513 Diseases of the respiratory system complicating pregnancy, third trimester: Secondary | ICD-10-CM | POA: Diagnosis not present

## 2015-10-19 DIAGNOSIS — O09893 Supervision of other high risk pregnancies, third trimester: Secondary | ICD-10-CM

## 2015-10-19 DIAGNOSIS — O09529 Supervision of elderly multigravida, unspecified trimester: Secondary | ICD-10-CM

## 2015-10-19 DIAGNOSIS — J45909 Unspecified asthma, uncomplicated: Secondary | ICD-10-CM | POA: Insufficient documentation

## 2015-10-19 DIAGNOSIS — M349 Systemic sclerosis, unspecified: Secondary | ICD-10-CM | POA: Diagnosis not present

## 2015-10-19 DIAGNOSIS — O09523 Supervision of elderly multigravida, third trimester: Secondary | ICD-10-CM | POA: Insufficient documentation

## 2015-10-19 DIAGNOSIS — O09293 Supervision of pregnancy with other poor reproductive or obstetric history, third trimester: Secondary | ICD-10-CM | POA: Insufficient documentation

## 2015-10-19 DIAGNOSIS — O34219 Maternal care for unspecified type scar from previous cesarean delivery: Secondary | ICD-10-CM

## 2015-10-19 NOTE — Addendum Note (Signed)
Encounter addended by: Eusebio Friendly, RT, RVT, RDMS on: 10/19/2015  5:10 PM<BR>    Actions taken: Imaging Exam ended

## 2015-10-20 ENCOUNTER — Other Ambulatory Visit (HOSPITAL_COMMUNITY): Payer: Self-pay | Admitting: *Deleted

## 2015-10-20 DIAGNOSIS — O09523 Supervision of elderly multigravida, third trimester: Secondary | ICD-10-CM

## 2015-10-21 DIAGNOSIS — J849 Interstitial pulmonary disease, unspecified: Secondary | ICD-10-CM | POA: Diagnosis not present

## 2015-10-21 DIAGNOSIS — Z0000 Encounter for general adult medical examination without abnormal findings: Secondary | ICD-10-CM | POA: Diagnosis not present

## 2015-10-21 DIAGNOSIS — K219 Gastro-esophageal reflux disease without esophagitis: Secondary | ICD-10-CM | POA: Diagnosis not present

## 2015-10-21 DIAGNOSIS — O09219 Supervision of pregnancy with history of pre-term labor, unspecified trimester: Secondary | ICD-10-CM | POA: Diagnosis not present

## 2015-10-21 DIAGNOSIS — M3489 Other systemic sclerosis: Secondary | ICD-10-CM | POA: Diagnosis not present

## 2015-10-28 DIAGNOSIS — O09293 Supervision of pregnancy with other poor reproductive or obstetric history, third trimester: Secondary | ICD-10-CM | POA: Diagnosis not present

## 2015-10-31 DIAGNOSIS — M349 Systemic sclerosis, unspecified: Secondary | ICD-10-CM | POA: Diagnosis not present

## 2015-10-31 DIAGNOSIS — O09213 Supervision of pregnancy with history of pre-term labor, third trimester: Secondary | ICD-10-CM | POA: Diagnosis not present

## 2015-11-04 DIAGNOSIS — Z8751 Personal history of pre-term labor: Secondary | ICD-10-CM | POA: Diagnosis not present

## 2015-11-07 DIAGNOSIS — O09213 Supervision of pregnancy with history of pre-term labor, third trimester: Secondary | ICD-10-CM | POA: Diagnosis not present

## 2015-11-11 DIAGNOSIS — O09213 Supervision of pregnancy with history of pre-term labor, third trimester: Secondary | ICD-10-CM | POA: Diagnosis not present

## 2015-11-15 DIAGNOSIS — Z23 Encounter for immunization: Secondary | ICD-10-CM | POA: Diagnosis not present

## 2015-11-15 DIAGNOSIS — O09213 Supervision of pregnancy with history of pre-term labor, third trimester: Secondary | ICD-10-CM | POA: Diagnosis not present

## 2015-11-15 DIAGNOSIS — O1213 Gestational proteinuria, third trimester: Secondary | ICD-10-CM | POA: Diagnosis not present

## 2015-11-16 ENCOUNTER — Ambulatory Visit (HOSPITAL_COMMUNITY)
Admission: RE | Admit: 2015-11-16 | Discharge: 2015-11-16 | Disposition: A | Discharge status: Home / Self Care | Payer: Medicare HMO | Source: Ambulatory Visit | Attending: Obstetrics and Gynecology | Admitting: Obstetrics and Gynecology

## 2015-11-16 ENCOUNTER — Encounter (HOSPITAL_COMMUNITY): Payer: Self-pay

## 2015-11-16 DIAGNOSIS — O09523 Supervision of elderly multigravida, third trimester: Secondary | ICD-10-CM | POA: Insufficient documentation

## 2015-11-16 DIAGNOSIS — O99513 Diseases of the respiratory system complicating pregnancy, third trimester: Secondary | ICD-10-CM | POA: Insufficient documentation

## 2015-11-16 DIAGNOSIS — Z3A32 32 weeks gestation of pregnancy: Secondary | ICD-10-CM | POA: Diagnosis not present

## 2015-11-16 DIAGNOSIS — J45909 Unspecified asthma, uncomplicated: Secondary | ICD-10-CM | POA: Insufficient documentation

## 2015-11-16 DIAGNOSIS — O34219 Maternal care for unspecified type scar from previous cesarean delivery: Secondary | ICD-10-CM | POA: Diagnosis not present

## 2015-11-16 DIAGNOSIS — O09293 Supervision of pregnancy with other poor reproductive or obstetric history, third trimester: Secondary | ICD-10-CM | POA: Insufficient documentation

## 2015-11-17 ENCOUNTER — Other Ambulatory Visit (HOSPITAL_COMMUNITY): Payer: Self-pay | Admitting: *Deleted

## 2015-11-17 DIAGNOSIS — O09523 Supervision of elderly multigravida, third trimester: Secondary | ICD-10-CM

## 2015-11-18 DIAGNOSIS — O09212 Supervision of pregnancy with history of pre-term labor, second trimester: Secondary | ICD-10-CM | POA: Diagnosis not present

## 2015-11-25 DIAGNOSIS — O09213 Supervision of pregnancy with history of pre-term labor, third trimester: Secondary | ICD-10-CM | POA: Diagnosis not present

## 2015-12-02 DIAGNOSIS — O09213 Supervision of pregnancy with history of pre-term labor, third trimester: Secondary | ICD-10-CM | POA: Diagnosis not present

## 2015-12-06 DIAGNOSIS — I270 Primary pulmonary hypertension: Secondary | ICD-10-CM | POA: Diagnosis not present

## 2015-12-06 DIAGNOSIS — M940 Chondrocostal junction syndrome [Tietze]: Secondary | ICD-10-CM | POA: Diagnosis not present

## 2015-12-06 DIAGNOSIS — O0281 Inappropriate change in quantitative human chorionic gonadotropin (hCG) in early pregnancy: Secondary | ICD-10-CM | POA: Diagnosis not present

## 2015-12-06 DIAGNOSIS — J8489 Other specified interstitial pulmonary diseases: Secondary | ICD-10-CM | POA: Diagnosis not present

## 2015-12-06 DIAGNOSIS — L639 Alopecia areata, unspecified: Secondary | ICD-10-CM | POA: Diagnosis not present

## 2015-12-06 DIAGNOSIS — E538 Deficiency of other specified B group vitamins: Secondary | ICD-10-CM | POA: Diagnosis not present

## 2015-12-06 DIAGNOSIS — I509 Heart failure, unspecified: Secondary | ICD-10-CM | POA: Diagnosis not present

## 2015-12-06 DIAGNOSIS — J8417 Other interstitial pulmonary diseases with fibrosis in diseases classified elsewhere: Secondary | ICD-10-CM | POA: Diagnosis not present

## 2015-12-06 DIAGNOSIS — M3489 Other systemic sclerosis: Secondary | ICD-10-CM | POA: Diagnosis not present

## 2015-12-06 DIAGNOSIS — E139 Other specified diabetes mellitus without complications: Secondary | ICD-10-CM | POA: Diagnosis not present

## 2015-12-09 DIAGNOSIS — O09213 Supervision of pregnancy with history of pre-term labor, third trimester: Secondary | ICD-10-CM | POA: Diagnosis not present

## 2015-12-13 DIAGNOSIS — N898 Other specified noninflammatory disorders of vagina: Secondary | ICD-10-CM | POA: Diagnosis not present

## 2015-12-13 DIAGNOSIS — O09213 Supervision of pregnancy with history of pre-term labor, third trimester: Secondary | ICD-10-CM | POA: Diagnosis not present

## 2015-12-14 ENCOUNTER — Ambulatory Visit (HOSPITAL_COMMUNITY)
Admission: RE | Admit: 2015-12-14 | Discharge: 2015-12-14 | Disposition: A | Discharge status: Home / Self Care | Payer: Medicare HMO | Source: Ambulatory Visit | Attending: Obstetrics and Gynecology | Admitting: Obstetrics and Gynecology

## 2015-12-14 ENCOUNTER — Other Ambulatory Visit: Payer: Self-pay | Admitting: Obstetrics and Gynecology

## 2015-12-14 ENCOUNTER — Encounter (HOSPITAL_COMMUNITY): Payer: Self-pay

## 2015-12-14 DIAGNOSIS — O26893 Other specified pregnancy related conditions, third trimester: Secondary | ICD-10-CM | POA: Insufficient documentation

## 2015-12-14 DIAGNOSIS — M3489 Other systemic sclerosis: Secondary | ICD-10-CM | POA: Insufficient documentation

## 2015-12-14 DIAGNOSIS — Z3A36 36 weeks gestation of pregnancy: Secondary | ICD-10-CM | POA: Insufficient documentation

## 2015-12-14 DIAGNOSIS — O09523 Supervision of elderly multigravida, third trimester: Secondary | ICD-10-CM | POA: Insufficient documentation

## 2015-12-14 DIAGNOSIS — O09529 Supervision of elderly multigravida, unspecified trimester: Secondary | ICD-10-CM

## 2015-12-14 DIAGNOSIS — Z8269 Family history of other diseases of the musculoskeletal system and connective tissue: Secondary | ICD-10-CM | POA: Diagnosis not present

## 2015-12-21 ENCOUNTER — Inpatient Hospital Stay (HOSPITAL_COMMUNITY): Payer: Medicare HMO | Admitting: Anesthesiology

## 2015-12-21 ENCOUNTER — Encounter (HOSPITAL_COMMUNITY): Payer: Self-pay

## 2015-12-21 ENCOUNTER — Encounter (HOSPITAL_COMMUNITY)
Admission: AD | Disposition: A | Discharge status: Home / Self Care | Payer: Self-pay | Source: Ambulatory Visit | Attending: Obstetrics and Gynecology

## 2015-12-21 ENCOUNTER — Inpatient Hospital Stay (HOSPITAL_COMMUNITY)
Admission: AD | Admit: 2015-12-21 | Discharge: 2015-12-24 | DRG: 766 | Disposition: A | Discharge status: Home / Self Care | Payer: Medicare HMO | Source: Ambulatory Visit | Attending: Obstetrics and Gynecology | Admitting: Obstetrics and Gynecology

## 2015-12-21 DIAGNOSIS — Z833 Family history of diabetes mellitus: Secondary | ICD-10-CM | POA: Diagnosis not present

## 2015-12-21 DIAGNOSIS — O99214 Obesity complicating childbirth: Secondary | ICD-10-CM | POA: Diagnosis present

## 2015-12-21 DIAGNOSIS — Z6834 Body mass index (BMI) 34.0-34.9, adult: Secondary | ICD-10-CM

## 2015-12-21 DIAGNOSIS — O9279 Other disorders of lactation: Secondary | ICD-10-CM | POA: Diagnosis not present

## 2015-12-21 DIAGNOSIS — Z3A37 37 weeks gestation of pregnancy: Secondary | ICD-10-CM | POA: Diagnosis not present

## 2015-12-21 DIAGNOSIS — K219 Gastro-esophageal reflux disease without esophagitis: Secondary | ICD-10-CM | POA: Diagnosis present

## 2015-12-21 DIAGNOSIS — E669 Obesity, unspecified: Secondary | ICD-10-CM | POA: Diagnosis present

## 2015-12-21 DIAGNOSIS — O09529 Supervision of elderly multigravida, unspecified trimester: Secondary | ICD-10-CM

## 2015-12-21 DIAGNOSIS — O9962 Diseases of the digestive system complicating childbirth: Secondary | ICD-10-CM | POA: Diagnosis not present

## 2015-12-21 DIAGNOSIS — Z98891 History of uterine scar from previous surgery: Secondary | ICD-10-CM | POA: Diagnosis not present

## 2015-12-21 DIAGNOSIS — O34211 Maternal care for low transverse scar from previous cesarean delivery: Secondary | ICD-10-CM | POA: Diagnosis not present

## 2015-12-21 DIAGNOSIS — M349 Systemic sclerosis, unspecified: Secondary | ICD-10-CM | POA: Diagnosis present

## 2015-12-21 DIAGNOSIS — O34219 Maternal care for unspecified type scar from previous cesarean delivery: Secondary | ICD-10-CM | POA: Diagnosis not present

## 2015-12-21 DIAGNOSIS — O368130 Decreased fetal movements, third trimester, not applicable or unspecified: Secondary | ICD-10-CM | POA: Diagnosis not present

## 2015-12-21 LAB — TYPE AND SCREEN
ABO/RH(D): B POS
Antibody Screen: NEGATIVE

## 2015-12-21 LAB — CBC
HCT: 35.1 % — ABNORMAL LOW (ref 36.0–46.0)
Hemoglobin: 11.7 g/dL — ABNORMAL LOW (ref 12.0–15.0)
MCH: 24.3 pg — AB (ref 26.0–34.0)
MCHC: 33.3 g/dL (ref 30.0–36.0)
MCV: 73 fL — ABNORMAL LOW (ref 78.0–100.0)
PLATELETS: 164 10*3/uL (ref 150–400)
RBC: 4.81 MIL/uL (ref 3.87–5.11)
RDW: 16 % — ABNORMAL HIGH (ref 11.5–15.5)
WBC: 6.9 10*3/uL (ref 4.0–10.5)

## 2015-12-21 LAB — ABO/RH: ABO/RH(D): B POS

## 2015-12-21 SURGERY — Surgical Case
Anesthesia: Spinal | Site: Abdomen | Wound class: Clean Contaminated

## 2015-12-21 MED ORDER — SIMETHICONE 80 MG PO CHEW
80.0000 mg | CHEWABLE_TABLET | Freq: Three times a day (TID) | ORAL | Status: DC
  Administered 2015-12-21 – 2015-12-24 (×8): 80 mg via ORAL
  Filled 2015-12-21 (×8): qty 1

## 2015-12-21 MED ORDER — MENTHOL 3 MG MT LOZG: 1.0000 | LOZENGE | OROMUCOSAL | Status: DC | PRN

## 2015-12-21 MED ORDER — DIPHENHYDRAMINE HCL 50 MG/ML IJ SOLN: 12.5000 mg | INTRAMUSCULAR | Status: DC | PRN

## 2015-12-21 MED ORDER — FENTANYL CITRATE (PF) 100 MCG/2ML IJ SOLN
INTRAMUSCULAR | Status: AC
  Filled 2015-12-21: qty 2

## 2015-12-21 MED ORDER — MORPHINE SULFATE-NACL 0.5-0.9 MG/ML-% IV SOSY
PREFILLED_SYRINGE | INTRAVENOUS | Status: AC
  Filled 2015-12-21: qty 1

## 2015-12-21 MED ORDER — COCONUT OIL OIL: 1.0000 "application " | TOPICAL_OIL | Status: DC | PRN

## 2015-12-21 MED ORDER — PROMETHAZINE HCL 25 MG/ML IJ SOLN
6.2500 mg | Freq: Once | INTRAMUSCULAR | Status: AC
  Administered 2015-12-21: 6.25 mg via INTRAVENOUS
  Filled 2015-12-21: qty 1

## 2015-12-21 MED ORDER — LACTATED RINGERS IV SOLN
INTRAVENOUS | Status: DC | PRN
  Administered 2015-12-21: 40 [IU] via INTRAVENOUS

## 2015-12-21 MED ORDER — SENNOSIDES-DOCUSATE SODIUM 8.6-50 MG PO TABS
2.0000 | ORAL_TABLET | ORAL | Status: DC
  Administered 2015-12-21 – 2015-12-23 (×3): 2 via ORAL
  Filled 2015-12-21 (×3): qty 2

## 2015-12-21 MED ORDER — METHYLERGONOVINE MALEATE 0.2 MG/ML IJ SOLN: 0.2000 mg | INTRAMUSCULAR | Status: DC | PRN

## 2015-12-21 MED ORDER — METHYLERGONOVINE MALEATE 0.2 MG PO TABS: 0.2000 mg | ORAL_TABLET | ORAL | Status: DC | PRN

## 2015-12-21 MED ORDER — PRENATAL MULTIVITAMIN CH
1.0000 | ORAL_TABLET | Freq: Every day | ORAL | Status: DC
  Administered 2015-12-22 – 2015-12-24 (×3): 1 via ORAL
  Filled 2015-12-21 (×3): qty 1

## 2015-12-21 MED ORDER — ONDANSETRON HCL 4 MG/2ML IJ SOLN
INTRAMUSCULAR | Status: DC | PRN
  Administered 2015-12-21: 4 mg via INTRAVENOUS

## 2015-12-21 MED ORDER — LACTATED RINGERS IV SOLN
INTRAVENOUS | Status: DC | PRN
  Administered 2015-12-21: 07:00:00 via INTRAVENOUS

## 2015-12-21 MED ORDER — ONDANSETRON HCL 4 MG/2ML IJ SOLN
INTRAMUSCULAR | Status: AC
  Filled 2015-12-21: qty 2

## 2015-12-21 MED ORDER — PROMETHAZINE HCL 25 MG/ML IJ SOLN: 6.2500 mg | INTRAMUSCULAR | Status: DC | PRN

## 2015-12-21 MED ORDER — SIMETHICONE 80 MG PO CHEW: 80.0000 mg | CHEWABLE_TABLET | ORAL | Status: DC | PRN

## 2015-12-21 MED ORDER — WITCH HAZEL-GLYCERIN EX PADS: 1.0000 "application " | MEDICATED_PAD | CUTANEOUS | Status: DC | PRN

## 2015-12-21 MED ORDER — SCOPOLAMINE 1 MG/3DAYS TD PT72
MEDICATED_PATCH | TRANSDERMAL | Status: AC
  Filled 2015-12-21: qty 1

## 2015-12-21 MED ORDER — LACTATED RINGERS IV SOLN
INTRAVENOUS | Status: DC
  Administered 2015-12-21 (×2): via INTRAVENOUS

## 2015-12-21 MED ORDER — NALOXONE HCL 0.4 MG/ML IJ SOLN: 0.4000 mg | INTRAMUSCULAR | Status: DC | PRN

## 2015-12-21 MED ORDER — ACETAMINOPHEN 500 MG PO TABS
1000.0000 mg | ORAL_TABLET | Freq: Four times a day (QID) | ORAL | Status: AC
  Administered 2015-12-21 (×2): 1000 mg via ORAL
  Filled 2015-12-21 (×2): qty 2

## 2015-12-21 MED ORDER — KETOROLAC TROMETHAMINE 30 MG/ML IJ SOLN
30.0000 mg | Freq: Four times a day (QID) | INTRAMUSCULAR | Status: AC | PRN
  Administered 2015-12-21: 30 mg via INTRAMUSCULAR

## 2015-12-21 MED ORDER — ZOLPIDEM TARTRATE 5 MG PO TABS: 5.0000 mg | ORAL_TABLET | Freq: Every evening | ORAL | Status: DC | PRN

## 2015-12-21 MED ORDER — PHENYLEPHRINE 8 MG IN D5W 100 ML (0.08MG/ML) PREMIX OPTIME
INJECTION | INTRAVENOUS | Status: DC | PRN
  Administered 2015-12-21: 30 ug/min via INTRAVENOUS

## 2015-12-21 MED ORDER — OXYTOCIN 40 UNITS IN LACTATED RINGERS INFUSION - SIMPLE MED: 2.5000 [IU]/h | INTRAVENOUS | Status: AC

## 2015-12-21 MED ORDER — MEPERIDINE HCL 25 MG/ML IJ SOLN: 6.2500 mg | INTRAMUSCULAR | Status: DC | PRN

## 2015-12-21 MED ORDER — SCOPOLAMINE 1 MG/3DAYS TD PT72
MEDICATED_PATCH | TRANSDERMAL | Status: DC | PRN
  Administered 2015-12-21: 1 via TRANSDERMAL

## 2015-12-21 MED ORDER — LACTATED RINGERS IV SOLN
INTRAVENOUS | Status: DC
  Administered 2015-12-21: 07:00:00 via INTRAVENOUS
  Administered 2015-12-21: 125 mL/h via INTRAVENOUS
  Administered 2015-12-21: 07:00:00 via INTRAVENOUS

## 2015-12-21 MED ORDER — HYDROXYCHLOROQUINE SULFATE 200 MG PO TABS
200.0000 mg | ORAL_TABLET | Freq: Three times a day (TID) | ORAL | Status: DC
  Administered 2015-12-21 – 2015-12-24 (×9): 200 mg via ORAL
  Filled 2015-12-21 (×10): qty 1

## 2015-12-21 MED ORDER — PANTOPRAZOLE SODIUM 40 MG PO TBEC
40.0000 mg | DELAYED_RELEASE_TABLET | Freq: Every day | ORAL | Status: DC
  Administered 2015-12-21 – 2015-12-22 (×2): 40 mg via ORAL
  Filled 2015-12-21 (×2): qty 1

## 2015-12-21 MED ORDER — KETOROLAC TROMETHAMINE 30 MG/ML IJ SOLN
INTRAMUSCULAR | Status: AC
  Filled 2015-12-21: qty 1

## 2015-12-21 MED ORDER — CEFAZOLIN SODIUM-DEXTROSE 2-4 GM/100ML-% IV SOLN
2.0000 g | Freq: Once | INTRAVENOUS | Status: AC
  Administered 2015-12-21: 2 g via INTRAVENOUS

## 2015-12-21 MED ORDER — SOD CITRATE-CITRIC ACID 500-334 MG/5ML PO SOLN
30.0000 mL | Freq: Once | ORAL | Status: AC
  Administered 2015-12-21: 30 mL via ORAL
  Filled 2015-12-21: qty 15

## 2015-12-21 MED ORDER — FERROUS SULFATE 325 (65 FE) MG PO TABS
325.0000 mg | ORAL_TABLET | Freq: Two times a day (BID) | ORAL | Status: DC
  Administered 2015-12-21 – 2015-12-24 (×6): 325 mg via ORAL
  Filled 2015-12-21 (×6): qty 1

## 2015-12-21 MED ORDER — ONDANSETRON HCL 4 MG/2ML IJ SOLN: 4.0000 mg | Freq: Three times a day (TID) | INTRAMUSCULAR | Status: DC | PRN

## 2015-12-21 MED ORDER — BUPIVACAINE IN DEXTROSE 0.75-8.25 % IT SOLN
INTRATHECAL | Status: DC | PRN
Start: 2015-12-21 — End: 2015-12-21
  Administered 2015-12-21: 1.7 mL via INTRATHECAL

## 2015-12-21 MED ORDER — ACETAMINOPHEN 325 MG PO TABS
650.0000 mg | ORAL_TABLET | ORAL | Status: DC | PRN
  Administered 2015-12-22 – 2015-12-24 (×6): 650 mg via ORAL
  Filled 2015-12-21 (×6): qty 2

## 2015-12-21 MED ORDER — IBUPROFEN 600 MG PO TABS
600.0000 mg | ORAL_TABLET | Freq: Four times a day (QID) | ORAL | Status: DC
  Administered 2015-12-21 – 2015-12-24 (×12): 600 mg via ORAL
  Filled 2015-12-21 (×11): qty 1

## 2015-12-21 MED ORDER — FAMOTIDINE IN NACL 20-0.9 MG/50ML-% IV SOLN
20.0000 mg | Freq: Once | INTRAVENOUS | Status: AC
  Administered 2015-12-21: 20 mg via INTRAVENOUS
  Filled 2015-12-21: qty 50

## 2015-12-21 MED ORDER — NALBUPHINE HCL 10 MG/ML IJ SOLN: 5.0000 mg | INTRAMUSCULAR | Status: DC | PRN

## 2015-12-21 MED ORDER — DIPHENHYDRAMINE HCL 25 MG PO CAPS
25.0000 mg | ORAL_CAPSULE | ORAL | Status: DC | PRN
  Filled 2015-12-21: qty 1

## 2015-12-21 MED ORDER — NALBUPHINE HCL 10 MG/ML IJ SOLN: 5.0000 mg | Freq: Once | INTRAMUSCULAR | Status: DC | PRN

## 2015-12-21 MED ORDER — OXYCODONE HCL 5 MG PO TABS
10.0000 mg | ORAL_TABLET | ORAL | Status: DC | PRN
  Filled 2015-12-21: qty 2

## 2015-12-21 MED ORDER — DIPHENHYDRAMINE HCL 25 MG PO CAPS: 25.0000 mg | ORAL_CAPSULE | Freq: Four times a day (QID) | ORAL | Status: DC | PRN

## 2015-12-21 MED ORDER — SIMETHICONE 80 MG PO CHEW
80.0000 mg | CHEWABLE_TABLET | ORAL | Status: DC
  Administered 2015-12-21 – 2015-12-23 (×3): 80 mg via ORAL
  Filled 2015-12-21 (×4): qty 1

## 2015-12-21 MED ORDER — FENTANYL CITRATE (PF) 100 MCG/2ML IJ SOLN: 25.0000 ug | INTRAMUSCULAR | Status: DC | PRN

## 2015-12-21 MED ORDER — NALOXONE HCL 2 MG/2ML IJ SOSY
1.0000 ug/kg/h | PREFILLED_SYRINGE | INTRAVENOUS | Status: DC | PRN
  Filled 2015-12-21: qty 2

## 2015-12-21 MED ORDER — SODIUM CHLORIDE 0.9 % IR SOLN
Status: DC | PRN
  Administered 2015-12-21: 1000 mL

## 2015-12-21 MED ORDER — SODIUM CHLORIDE 0.9% FLUSH: 3.0000 mL | INTRAVENOUS | Status: DC | PRN

## 2015-12-21 MED ORDER — OXYTOCIN 10 UNIT/ML IJ SOLN
INTRAMUSCULAR | Status: AC
  Filled 2015-12-21: qty 4

## 2015-12-21 MED ORDER — MORPHINE SULFATE (PF) 0.5 MG/ML IJ SOLN
INTRAMUSCULAR | Status: DC | PRN
  Administered 2015-12-21: .2 mg via EPIDURAL

## 2015-12-21 MED ORDER — KETOROLAC TROMETHAMINE 30 MG/ML IJ SOLN: 30.0000 mg | Freq: Four times a day (QID) | INTRAMUSCULAR | Status: AC | PRN

## 2015-12-21 MED ORDER — DIBUCAINE 1 % RE OINT: 1.0000 "application " | TOPICAL_OINTMENT | RECTAL | Status: DC | PRN

## 2015-12-21 MED ORDER — OXYCODONE HCL 5 MG PO TABS
5.0000 mg | ORAL_TABLET | ORAL | Status: DC | PRN
  Administered 2015-12-22 – 2015-12-24 (×10): 5 mg via ORAL
  Filled 2015-12-21 (×9): qty 1

## 2015-12-21 MED ORDER — PHENYLEPHRINE 8 MG IN D5W 100 ML (0.08MG/ML) PREMIX OPTIME
INJECTION | INTRAVENOUS | Status: AC
  Filled 2015-12-21: qty 100

## 2015-12-21 MED ORDER — ALBUTEROL SULFATE (2.5 MG/3ML) 0.083% IN NEBU: 3.0000 mL | INHALATION_SOLUTION | RESPIRATORY_TRACT | Status: DC | PRN

## 2015-12-21 SURGICAL SUPPLY — 33 items
APL SKNCLS STERI-STRIP NONHPOA (GAUZE/BANDAGES/DRESSINGS) ×1
BARRIER ADHS 3X4 INTERCEED (GAUZE/BANDAGES/DRESSINGS) ×1 IMPLANT
BENZOIN TINCTURE PRP APPL 2/3 (GAUZE/BANDAGES/DRESSINGS) ×2 IMPLANT
BRR ADH 4X3 ABS CNTRL BYND (GAUZE/BANDAGES/DRESSINGS) ×1
CHLORAPREP W/TINT 26ML (MISCELLANEOUS) ×2 IMPLANT
CLAMP CORD UMBIL (MISCELLANEOUS) ×1 IMPLANT
CLOTH BEACON ORANGE TIMEOUT ST (SAFETY) ×2 IMPLANT
DRSG OPSITE POSTOP 4X10 (GAUZE/BANDAGES/DRESSINGS) ×2 IMPLANT
ELECT REM PT RETURN 9FT ADLT (ELECTROSURGICAL) ×2
ELECTRODE REM PT RTRN 9FT ADLT (ELECTROSURGICAL) ×1 IMPLANT
GLOVE BIOGEL M 6.5 STRL (GLOVE) ×4 IMPLANT
GLOVE BIOGEL PI IND STRL 6.5 (GLOVE) ×1 IMPLANT
GLOVE BIOGEL PI IND STRL 7.0 (GLOVE) ×1 IMPLANT
GLOVE BIOGEL PI INDICATOR 6.5 (GLOVE) ×1
GLOVE BIOGEL PI INDICATOR 7.0 (GLOVE) ×1
GOWN STRL REUS W/TWL LRG LVL3 (GOWN DISPOSABLE) ×6 IMPLANT
NS IRRIG 1000ML POUR BTL (IV SOLUTION) ×2 IMPLANT
PACK C SECTION WH (CUSTOM PROCEDURE TRAY) ×2 IMPLANT
PAD ABD 7.5X8 STRL (GAUZE/BANDAGES/DRESSINGS) ×1 IMPLANT
PAD OB MATERNITY 4.3X12.25 (PERSONAL CARE ITEMS) ×2 IMPLANT
PENCIL SMOKE EVAC W/HOLSTER (ELECTROSURGICAL) ×2 IMPLANT
RTRCTR C-SECT PINK 25CM LRG (MISCELLANEOUS) ×1 IMPLANT
SPONGE GAUZE 4X4 12PLY (GAUZE/BANDAGES/DRESSINGS) ×1 IMPLANT
STRIP CLOSURE SKIN 1/2X4 (GAUZE/BANDAGES/DRESSINGS) ×2 IMPLANT
SUT PDS AB 0 CT1 27 (SUTURE) ×4 IMPLANT
SUT VIC AB 0 CTX 36 (SUTURE) ×6
SUT VIC AB 0 CTX36XBRD ANBCTRL (SUTURE) ×3 IMPLANT
SUT VIC AB 2-0 CT1 27 (SUTURE) ×2
SUT VIC AB 2-0 CT1 TAPERPNT 27 (SUTURE) ×1 IMPLANT
SUT VIC AB 4-0 KS 27 (SUTURE) ×2 IMPLANT
TAPE CLOTH SURG 4X10 WHT LF (GAUZE/BANDAGES/DRESSINGS) ×1 IMPLANT
TOWEL OR 17X24 6PK STRL BLUE (TOWEL DISPOSABLE) ×2 IMPLANT
TRAY FOLEY CATH SILVER 14FR (SET/KITS/TRAYS/PACK) ×2 IMPLANT

## 2015-12-21 NOTE — Anesthesia Procedure Notes (Signed)
Spinal  Patient location during procedure: OR Staffing Anesthesiologist: Nolon Nations Performed: anesthesiologist  Preanesthetic Checklist Completed: patient identified, site marked, surgical consent, pre-op evaluation, timeout performed, IV checked, risks and benefits discussed and monitors and equipment checked Spinal Block Patient position: sitting Prep: Betadine Patient monitoring: heart rate, continuous pulse ox and blood pressure Location: L3-4 Injection technique: single-shot Needle Needle type: Sprotte  Needle gauge: 24 G Needle length: 9 cm Assessment Sensory level: T8 Additional Notes Expiration date of kit checked and confirmed. Patient tolerated procedure well, without complications.

## 2015-12-21 NOTE — Anesthesia Preprocedure Evaluation (Signed)
Anesthesia Evaluation  Patient identified by MRN, date of birth, ID band Patient awake    Reviewed: Allergy & Precautions, NPO status , Patient's Chart, lab work & pertinent test results, reviewed documented beta blocker date and time   Airway Mallampati: III  TM Distance: >3 FB Neck ROM: Full    Dental no notable dental hx.    Pulmonary neg pulmonary ROS,    Pulmonary exam normal breath sounds clear to auscultation- rhonchi       Cardiovascular + Peripheral Vascular Disease  Normal cardiovascular exam Rhythm:Regular Rate:Normal     Neuro/Psych negative neurological ROS  negative psych ROS   GI/Hepatic Neg liver ROS, GERD  ,  Endo/Other  Obesity  Renal/GU negative Renal ROS  negative genitourinary   Musculoskeletal Scleroderma   Abdominal   Peds  Hematology  (+) anemia ,   Anesthesia Other Findings   Reproductive/Obstetrics (+) Pregnancy Missed Ab HSV                             Anesthesia Physical  Anesthesia Plan  ASA: II  Anesthesia Plan: Spinal   Post-op Pain Management:    Induction:   Airway Management Planned:   Additional Equipment:   Intra-op Plan:   Post-operative Plan:   Informed Consent: I have reviewed the patients History and Physical, chart, labs and discussed the procedure including the risks, benefits and alternatives for the proposed anesthesia with the patient or authorized representative who has indicated his/her understanding and acceptance.     Plan Discussed with: CRNA  Anesthesia Plan Comments:         Anesthesia Quick Evaluation

## 2015-12-21 NOTE — MAU Note (Signed)
Pt presents complaining of contractions. Denies leaking or bleeding. Reports good fetal movement. Scheduled for repeat c/s next Friday.

## 2015-12-21 NOTE — Anesthesia Postprocedure Evaluation (Signed)
Anesthesia Post Note  Patient: Laura Rowland  Procedure(s) Performed: Procedure(s) (LRB): CESAREAN SECTION (N/A)  Patient location during evaluation: PACU Anesthesia Type: Spinal Level of consciousness: oriented and awake and alert Pain management: pain level controlled Vital Signs Assessment: post-procedure vital signs reviewed and stable Respiratory status: spontaneous breathing, respiratory function stable and patient connected to nasal cannula oxygen Cardiovascular status: blood pressure returned to baseline and stable Postop Assessment: no headache, no backache, patient able to bend at knees, spinal receding and no signs of nausea or vomiting Anesthetic complications: no     Last Vitals:  Vitals:   12/21/15 0815 12/21/15 0830  BP: 118/73 119/73  Pulse: 65 67  Resp: 15 17  Temp:      Last Pain:  Vitals:   12/21/15 0830  TempSrc:   PainSc: 4    Pain Goal:                 Catalina Gravel

## 2015-12-21 NOTE — MAU Note (Signed)
AT [place], RN      AT [place]

## 2015-12-21 NOTE — Lactation Note (Signed)
This note was copied from a baby's chart. Lactation Consultation Note  Patient Name: Laura Rowland [id number] Date: 12/21/2015 Reason for consult: Initial assessment Breastfeeding consultation services and support information given and reviewed.  Mom states that she attempted to breastfeed her daughter 11 years ago but milk didn't come in.  She states she tried to pump but no milk obtained.  Explained that different circumstances can cause low milk supply. Mom states she plans on formula feeding also because she wants her baby to eat.  Discussed normal newborn feeding behavior and that we monitor baby's output and weight closely. Taught parents about value of colostrum and milk coming to volume.  Baby just had 10 mls of formula but still awake and showing some cues.  Offered breastfeeding assist and mom agreeable.  Positioned baby in football hold.  Taught mom hand expression and one small drop visible.  Baby latched easily and nursed actively with a few swallows.  Reviewed waking techniques and breast massage.  Strongly encouraged mom to call for assist prn.  She denies questions at present time.  Maternal Data Has patient been taught Hand Expression?: Yes Does the patient have breastfeeding experience prior to this delivery?: No  Feeding Feeding Type: Breast Fed Nipple Type: Slow - flow Length of feed: 15 min  LATCH Score/Interventions Latch: Grasps breast easily, tongue down, lips flanged, rhythmical sucking.  Audible Swallowing: A few with stimulation  Type of Nipple: Everted at rest and after stimulation  Comfort (Breast/Nipple): Soft / non-tender     Hold (Positioning): Assistance needed to correctly position infant at breast and maintain latch. Intervention(s): Breastfeeding basics reviewed;Support Pillows;Position options;Skin to skin  LATCH Score: 8  Lactation Tools Discussed/Used     Consult Status Consult Status: Follow-up Date: 12/22/15 Follow-up type:  In-patient    Ave Filter 12/21/2015, 2:21 PM

## 2015-12-21 NOTE — Transfer of Care (Signed)
Immediate Anesthesia Transfer of Care Note  Patient: Laura Rowland  Procedure(s) Performed: Procedure(s): CESAREAN SECTION (N/A)  Patient Location: PACU  Anesthesia Type:Spinal  Level of Consciousness: awake, alert  and oriented  Airway & Oxygen Therapy: Patient Spontanous Breathing  Post-op Assessment: Report given to RN and Post -op Vital signs reviewed and stable  Post vital signs: Reviewed and stable  Last Vitals:  Vitals:   12/21/15 0518  BP: 128/69  Pulse: 88  Resp: 22  Temp: 36.3 C    Last Pain:  Vitals:   12/21/15 0518  TempSrc: Oral  PainSc:          Complications: No apparent anesthesia complications

## 2015-12-21 NOTE — Op Note (Signed)
Cesarean Section Procedure Note  Indications: previous uterine incision previous LTCS  Pre-operative Diagnosis: 37 week 5 day pregnancy.  Post-operative Diagnosis: same  Surgeon: Catha Brow.   Assistants: Irene Shipper [name]  Anesthesia: Spinal anesthesia  ASA Class: 2    Indication: Pt is 37 wks and 5 days wit h/o cesarean section who presented grossly ruptured. She declined trial of labor and desires repeat cesarean section.  Procedure Details   The patient was seen in the Holding Room. The risks, benefits, complications, treatment options, and expected outcomes were discussed with the patient.  The patient concurred with the proposed plan, giving informed consent.  The site of surgery properly noted/marked. The patient was taken to Operating Room # 9, identified as Laura Rowland and the procedure verified as C-Section Delivery. A Time Out was held and the above information confirmed.  After induction of anesthesia, the patient was draped and prepped in the usual sterile manner. A Pfannenstiel incision was made and carried down through the subcutaneous tissue to the fascia. Fascial incision was made and extended transversely. The fascia was separated from the underlying rectus tissue superiorly and inferiorly. The peritoneum was identified and entered. Peritoneal incision was extended longitudinally. The utero-vesical peritoneal reflection was incised transversely and the bladder flap was bluntly freed from the lower uterine segment. A low transverse uterine incision was made. Delivered from cephalic presentation was a   Female   with Apgar scores of 9 at one minute and 9 at five minutes. After the umbilical cord was clamped and cut cord blood was obtained for evaluation. The placenta was removed intact and appeared normal. The uterine outline, tubes and ovaries appeared normal. The uterine incision was closed with running locked sutures of 0 vicryl...  A second layer of 0 vicryl was used to  imbricate the incision . Hemostasis was observed. Lavage was carried out until clear. The fascia was then reapproximated with running sutures of 0 pds. The skin was reapproximated with 4-0 vicryl .  Instrument, sponge, and needle counts were correct prior the abdominal closure and at the conclusion of the case.   Findings: Female infant in the cephalic presentation  Normal fallopian tubes and ovaries. Adhesion of the bladder to the mid uterine segment   Estimated Blood Loss:  500 mL         Drains: None         Total IV Fluids:  Per anesthesia ml         Specimens: Placenta to labor and delivery           Implants: none         Complications:  None; patient tolerated the procedure well.         Disposition: PACU - hemodynamically stable.         Condition: stable  Attending Attestation: I performed the procedure.

## 2015-12-21 NOTE — H&P (Signed)
Laura Rowland is a 40 y.o. female [phone] at 37 wks and 5 days presenting  Complaining of Rupture of membranes at 4 am clear fluid. Onset of contraction every 10 minutes. Decreased fetal movement  No vaginal bleeding. Pregnancy is complicated by h/o cesarean section. Pt desires repeat cesarean section declines trial of labor. Pregnancy also complicated by scleroderma with Anti SSA antibodies present. H/o preterm delivery at 34 wks. Pt has received makena weekly  to prolong pregnancy.  Prenatal care provided by Dr. Christophe Louis with [facility].  OB History    Gravida Para Term Preterm AB Living   4 1   1 2 1    SAB TAB Ectopic Multiple Live Births   1 1     1      Past Medical History:  Diagnosis Date  . Anemia   . Scleredema [facility])    Past Surgical History:  Procedure Laterality Date  . BREAST CYST EXCISION  1996  . CESAREAN SECTION    . CYST REMOVAL HAND  2008  . DILATION AND EVACUATION N/A 06/28/2014   Procedure: DILATATION AND EVACUATION;  Surgeon: Christophe Louis, MD;  Location: [place] [facility];  Service: Gynecology;  Laterality: N/A;  . WISDOM TOOTH EXTRACTION     Family History: family history includes Diabetes in her father. Social History:  reports that she has never smoked. She has never used smokeless tobacco. She reports that she does not drink alcohol or use drugs.     Maternal Diabetes: No Genetic Screening: Normal Maternal Ultrasounds/Referrals: Normal Fetal Ultrasounds or other Referrals:  Fetal echo Maternal Substance Abuse:  No Significant Maternal Medications:  Meds include: Other: Plaquenil  Significant Maternal Lab Results:  None Other Comments:  pt with Scleroderma she tested positive for anti SSA antibodies   Review of Systems  Constitutional: Negative.   HENT: Negative.   Eyes: Negative.   Respiratory: Negative.   Cardiovascular: Negative.   Gastrointestinal: Positive for abdominal pain and nausea.  Genitourinary: Negative.   Musculoskeletal: Negative.   Skin:  Negative.   Neurological: Negative.   Endo/Heme/Allergies: Negative.   Psychiatric/Behavioral: Negative.    Maternal Medical History:  Reason for admission: Rupture of membranes, contractions and nausea.   Contractions: Onset was 1-2 hours ago.   Frequency: regular.   Perceived severity is strong.    Fetal activity: Perceived fetal activity is decreased.   Last perceived fetal movement was within the past 12 hours.    Prenatal Complications - Diabetes: none.    Dilation: 2 Effacement (%): 80 Station: -1 Exam by:: D. Callaway RN Blood pressure 128/69, pulse 88, temperature 97.3 F (36.3 C), temperature source Oral, resp. rate 22, height 6' (1.829 m), weight 115.7 kg (255 lb), last menstrual period 04/01/2015, SpO2 100 %. Maternal Exam:  Abdomen: Surgical scars: low transverse.   Estimated fetal weight is 6 lbs .   Fetal presentation: vertex  Introitus: Normal vulva. Normal vagina.  Amniotic fluid character: clear.     Fetal Exam Fetal Monitor Review: Baseline rate: 120.  Variability: moderate (6-25 bpm).   Pattern: accelerations present and no decelerations.    Fetal State Assessment: Category I - tracings are normal.     Physical Exam  Vitals reviewed. Constitutional: She is oriented to person, place, and time. She appears well-developed and well-nourished.  HENT:  Head: Normocephalic and atraumatic.  Eyes: Conjunctivae are normal. Pupils are equal, round, and reactive to light.  Neck: Normal range of motion. Neck supple.  Cardiovascular: Normal rate and regular rhythm.  Respiratory: Effort normal.  Musculoskeletal: Normal range of motion. She exhibits no edema.  Neurological: She is alert and oriented to person, place, and time.  Skin: Skin is warm and dry.  Psychiatric: She has a normal mood and affect.    Prenatal labs: ABO, Rh:  B positive  Antibody:  sceen negative  Rubella:  Immune  RPR:   Nonreactive  HBsAg:   Negative  HIV:   Nonreactive   GBS:     Assessment/Plan: 37 wks and 5 days with h/o cesarean section presents grossly ruptured in labor. She declines trial of labor and desires repeat cesarean section,. R/B/A of cesarean section discussed including but not limited to infection/ bleeding damage to bowel bladder baby with the need for further surgery. R/o transfusion discussed. Pt voiced understanding and desires to proceed with repeat cesarean section.    Macyn Shropshire J. 12/21/2015, 5:35 AM

## 2015-12-21 NOTE — Lactation Note (Signed)
This note was copied from a baby's chart. Lactation Consultation Note Follow up visit at 13 hours of age.  Mom reports being frustrated and not liking to hear baby cry. Lc reassured mom and offered to assist.  Mom changed diaper.  LC noted baby to be a little jittery and will report to RN.  LC assisted with hand expression of a few drops of colostrum.  LC placed baby STS with mom.  Mom has large breasts with soft breast tissue and flat nipples.  LC assisted with "sandwiching" breast for latching.  MOm is recovering from c/s and still needs assist with positioning.  Baby latched well with wide gape and flanged lips.  Stimulation needed to maintain feeding of about 10 minutes.  Baby would not maintain feeding after that and tried on left breast as well.   LC assisted with supplementing formula of 73mls per bottle as mom has been feeding baby.   Mom reports having scleraderma with difficulty moving her left hand to hold baby and pump.  LC adjusted with pillow and blanket support.  Discussed DEBP use with mom and she reports she is eager. Yetter set up DEBP and mom is using #27 flanges with good fit at this time.   LC offered encouragement to mom reassured her and answered all questions.         Patient Name: Girl Lolamae Baumhover [id number] Date: 12/21/2015 Reason for consult: Follow-up assessment   Maternal Data Has patient been taught Hand Expression?: Yes Does the patient have breastfeeding experience prior to this delivery?: Yes  Feeding Feeding Type: Breast Fed Length of feed: 10 min  LATCH Score/Interventions Latch: Repeated attempts needed to sustain latch, nipple held in mouth throughout feeding, stimulation needed to elicit sucking reflex. Intervention(s): Adjust position;Assist with latch;Breast massage;Breast compression  Audible Swallowing: A few with stimulation Intervention(s): Skin to skin;Hand expression;Alternate breast massage  Type of Nipple: Flat  Comfort (Breast/Nipple): Soft /  non-tender     Hold (Positioning): Full assist, staff holds infant at breast Intervention(s): Breastfeeding basics reviewed;Support Pillows;Position options;Skin to skin  LATCH Score: 5  Lactation Tools Discussed/Used WIC Program:  (plans to call) Pump Review: Setup, frequency, and cleaning;Milk Storage Initiated by:: JS Date initiated:: 12/21/15   Consult Status Consult Status: Follow-up Date: 12/22/15 Follow-up type: In-patient    Kendell Bane Justine Null 12/21/2015, 8:50 PM

## 2015-12-22 ENCOUNTER — Encounter (HOSPITAL_COMMUNITY): Payer: Self-pay | Admitting: Anesthesiology

## 2015-12-22 LAB — CBC
HEMATOCRIT: 29.2 % — AB (ref 36.0–46.0)
HEMOGLOBIN: 9.6 g/dL — AB (ref 12.0–15.0)
MCH: 24.1 pg — AB (ref 26.0–34.0)
MCHC: 32.9 g/dL (ref 30.0–36.0)
MCV: 73.2 fL — ABNORMAL LOW (ref 78.0–100.0)
Platelets: 151 10*3/uL (ref 150–400)
RBC: 3.99 MIL/uL (ref 3.87–5.11)
RDW: 16.2 % — AB (ref 11.5–15.5)
WBC: 8.2 10*3/uL (ref 4.0–10.5)

## 2015-12-22 LAB — RPR: RPR Ser Ql: NONREACTIVE

## 2015-12-22 NOTE — Lactation Note (Signed)
This note was copied from a baby's chart. Lactation Consultation Note  Patient Name: Laura Rowland [id number] Date: 12/22/2015 Reason for consult: Follow-up assessment;Difficult latch;Other (Comment) (early term baby) RN called for assist, could not get baby latched. When [place] arrived baby very fussy, gave small appetizer of formula with bottle to settle baby,  demonstrated suck training to help with latch. Baby has very tight mouth, sucking lips, tongue and humping her tongue with suck exam.  Attempted to latch baby but Mom has large, flat nipples and baby could not sustain the latch, could not obtain or sustain good depth. Initiated #24 nipple shield but Mom has large pendulous breasts and nipple shield did not stay on well, baby could not obtain depth with nipple shield either. Mom also has some limited mobility in her hands due to schleraderma making it difficult for her to support breast well. Discussed with Mom pump/bottle feeding till her milk comes in and Mom was pleased with this plan. Advised to pump every 3 hours for 15 minutes, supplement baby with EBM/formula starting with 20 ml today and increasing to satisfy baby. Demonstrated suck training and paced feeding using a bottle. Advised Mom that once her milk comes in latch may be easier. [facility] referral faxed today to [facility] office. Discussed [facility] loaner program for d/c if needed. RN advised of plan.   Maternal Data    Feeding Feeding Type: Formula Nipple Type: Slow - flow Length of feed: 0 min  LATCH Score/Interventions Latch: Repeated attempts needed to sustain latch, nipple held in mouth throughout feeding, stimulation needed to elicit sucking reflex. Intervention(s): Adjust position;Assist with latch;Breast massage;Breast compression  Audible Swallowing: None  Type of Nipple: Flat Intervention(s): Double electric pump  Comfort (Breast/Nipple): Soft / non-tender     Hold (Positioning): Assistance needed to correctly position infant  at breast and maintain latch.  LATCH Score: 5  Lactation Tools Discussed/Used Tools: Pump;Nipple Shields Nipple shield size: 24 Breast pump type: Double-Electric Breast Pump   Consult Status Consult Status: Follow-up Date: 12/23/15 Follow-up type: In-patient    Katrine Coho 12/22/2015, 2:31 PM

## 2015-12-22 NOTE — Addendum Note (Signed)
Addendum  created 12/22/15 1015 by Garner Nash, CRNA   Charge Capture section accepted, Delete clinical note, Sign clinical note

## 2015-12-22 NOTE — Progress Notes (Signed)
Subjective: Postpartum Day 1: Cesarean Delivery Patient reports incisional pain and tolerating PO.  Pain is well controlled and lochia is minimal   Objective: Vital signs in last 24 hours: Temp:  [97.4 F (36.3 C)-98.3 F (36.8 C)] 97.9 F (36.6 C) (11/30 0627) Pulse Rate:  [67-90] 77 (11/30 0627) Resp:  [16-18] 18 (11/30 0627) BP: (110-123)/(50-78) 112/50 (11/30 0627) SpO2:  [98 %-100 %] 100 % (11/30 [id number])  Physical Exam:  General: alert and cooperative Lochia: appropriate Uterine Fundus: firm Incision: bandage clean dry and intact  DVT Evaluation: No evidence of DVT seen on physical exam.   Recent Labs  12/21/15 0542 12/22/15 0519  HGB 11.7* 9.6*  HCT 35.1* 29.2*    Assessment/Plan: Status post Cesarean section. Doing well postoperatively.  Continue current care Encouraged ambulation in the halls tid  Pt advised to remove her bandage while in the shower today .  Lucah Petta J. 12/22/2015, 8:25 AM

## 2015-12-22 NOTE — Anesthesia Postprocedure Evaluation (Deleted)
Anesthesia Post Note  Patient: Laura Rowland  Procedure(s) Performed: Procedure(s) (LRB): CESAREAN SECTION (N/A)  Patient location during evaluation: Mother Baby Anesthesia Type: Epidural Level of consciousness: awake and alert, oriented and patient cooperative Pain management: pain level controlled Vital Signs Assessment: post-procedure vital signs reviewed and stable Respiratory status: spontaneous breathing Cardiovascular status: stable Postop Assessment: no headache, epidural receding, patient able to bend at knees and no signs of nausea or vomiting Anesthetic complications: no Comments: Pain score 3.     Last Vitals:  Vitals:   12/22/15 0245 12/22/15 0627  BP: (!) 110/50 (!) 112/50  Pulse: 81 77  Resp: 16 18  Temp: 36.7 C 36.6 C    Last Pain:  Vitals:   12/22/15 0627  TempSrc: Oral  PainSc:    Pain Goal: Patients Stated Pain Goal: 3 (12/22/15 [id number])               Rico Sheehan

## 2015-12-22 NOTE — Anesthesia Postprocedure Evaluation (Signed)
Anesthesia Post Note  Patient: Laura Rowland  Procedure(s) Performed: Procedure(s) (LRB): CESAREAN SECTION (N/A)  Patient location during evaluation: Mother Baby Anesthesia Type: Spinal Level of consciousness: awake and alert, oriented and patient cooperative Pain management: pain level controlled Vital Signs Assessment: post-procedure vital signs reviewed and stable Respiratory status: spontaneous breathing Cardiovascular status: stable Postop Assessment: no headache, patient able to bend at knees, no signs of nausea or vomiting and spinal receding Anesthetic complications: no Comments: Pain score 3.     Last Vitals:  Vitals:   12/22/15 0245 12/22/15 0627  BP: (!) 110/50 (!) 112/50  Pulse: 81 77  Resp: 16 18  Temp: 36.7 C 36.6 C    Last Pain:  Vitals:   12/22/15 0627  TempSrc: Oral  PainSc:    Pain Goal: Patients Stated Pain Goal: 3 (12/22/15 [id number])               Rico Sheehan

## 2015-12-23 ENCOUNTER — Encounter (HOSPITAL_COMMUNITY): Payer: Self-pay | Admitting: Obstetrics and Gynecology

## 2015-12-23 MED ORDER — CALCIUM CARBONATE ANTACID 500 MG PO CHEW: 1.0000 | CHEWABLE_TABLET | Freq: Three times a day (TID) | ORAL | Status: DC | PRN

## 2015-12-23 MED ORDER — PANTOPRAZOLE SODIUM 40 MG PO TBEC
40.0000 mg | DELAYED_RELEASE_TABLET | Freq: Two times a day (BID) | ORAL | Status: DC
  Administered 2015-12-23 – 2015-12-24 (×3): 40 mg via ORAL
  Filled 2015-12-23 (×3): qty 1

## 2015-12-23 NOTE — Lactation Note (Signed)
This note was copied from a baby's chart. Lactation Consultation Note Follow up visit at 41 hours of age.  Rn requesting assist with engorgement care, Rn reports attempting heat for engorgement.  LC discussed with Rn and mom to use ice to decrease swelling at breast to help milk flow and to manage discomfort.  Mom has very large breast with dependent edema noted to left breast areola.  Moms breast are filling with full milk ducts massaged and uncomfortable.  Mom reports limited pumping early today and then several 15 minute pumping sessions in a row.   [employer] advised mom to ice breasts every 2 1/2 hours and pump for 15 minutes while FOB is offering bottle of formula.  Mom to give EBM if she is able to collect any.   Mom is using #24 flange for pumping and reports comfortable.  LC encouraged mom to use #27 if nipple is rubbing on flange.  At this time pump isn't pulling nipple evert very well.  Mom to use coconut oil for lubricant with pumping as needed. Mom to call for assist as needed.    Report of plan given to Laura Rowland.    Patient Name: Laura Rowland [id number] Date: 12/23/2015 Reason for consult: Follow-up assessment;Breast/nipple pain   Maternal Data    Feeding Feeding Type: Bottle Fed - Formula Nipple Type: Slow - flow  LATCH Score/Interventions                Intervention(s): Breastfeeding basics reviewed     Lactation Tools Discussed/Used     Consult Status Consult Status: Follow-up Date: 12/24/15 Follow-up type: In-patient    Laura Rowland 12/23/2015, 10:08 PM

## 2015-12-23 NOTE — Progress Notes (Signed)
Subjective: Postpartum Day 2: Cesarean Delivery Pt had coughing spell last night due to her GERD, but other than that has been doing well.  Pain controlled.  No nausea/vomiting.  Bleeding appropriate.  No F/C/CP/SOB.  +flatus, BM.  Breastfeeding without issues.  Objective: Vital signs in last 24 hours: Temp:  [97.8 F (36.6 C)-98.6 F (37 C)] 97.8 F (36.6 C) (12/01 0524) Pulse Rate:  [78-83] 78 (12/01 0524) Resp:  [18] 18 (12/01 0524) BP: (133-145)/(63-72) 145/72 (12/01 0524)  Physical Exam:  General: alert and cooperative  CV: RRR Lungs: CTAB Abd: soft, uterus non-tender, below umbilicus, +BS Lochia: appropriate Incision: bandage clean dry and intact  DVT Evaluation: No evidence of DVT seen on physical exam.   Recent Labs  12/21/15 0542 12/22/15 0519  HGB 11.7* 9.6*  HCT 35.1* 29.2*    Assessment/Plan: Status post Cesarean section. Doing well postoperatively.  Continue current care Encouraged ambulation in the halls tid  GERD: Prilosec twice daily, Tums as needed  Dispo: Plan for discharge home tomorrow, on POD#3  Janyth Pupa, M 12/23/2015, 7:09 AM

## 2015-12-24 MED ORDER — OXYCODONE HCL 5 MG PO TABS: 5.0000 mg | ORAL_TABLET | Freq: Four times a day (QID) | ORAL | 0 refills | Status: DC | PRN

## 2015-12-24 MED ORDER — IBUPROFEN 600 MG PO TABS: 600.0000 mg | ORAL_TABLET | Freq: Four times a day (QID) | ORAL | 0 refills | Status: DC

## 2015-12-24 NOTE — Discharge Instructions (Signed)
Cesarean Delivery, Care After Refer to this sheet in the next few weeks. These instructions provide you with information about caring for yourself after your procedure. Your health care provider may also give you more specific instructions. Your treatment has been planned according to current medical practices, but problems sometimes occur. Call your health care provider if you have any problems or questions after your procedure. What can I expect after the procedure? After the procedure, it is common to have:  A small amount of blood or clear fluid coming from the incision.  Some redness, swelling, and pain in your incision area.  Some abdominal pain and soreness.  Vaginal bleeding (lochia).  Pelvic cramps.  Fatigue. Follow these instructions at home: Incision care  Follow instructions from your health care provider about how to take care of your incision.  Wash your hands with soap and water before you change your bandage (dressing). If soap and water are not available, use hand sanitizer.  If adhesive strip edges start to loosen and curl up, you may trim the loose edges. Do not remove adhesive strips completely unless your health care provider tells you to do that.  Check your incision area every day for signs of infection.   Check for:  More redness, swelling, or pain.  More fluid or blood.  Warmth.  Pus or a bad smell.  When you cough or sneeze, hug a pillow. This helps with pain and decreases the chance of your incision opening up (dehiscing). Do this until your incision heals.       Medicines  Take over-the-counter and prescription medicines only as told by your health care provider.  For pain management: You may alternate between the Ibuprofen and Percocet as needed for pain.  Percocet may cause constipation so if you are requiring this medication please continue with the stool softener twice daily.  If you were prescribed an antibiotic medicine, take it as  told by your health care provider. Do not stop taking the antibiotic until it is finished. Driving  Do not drive or operate heavy machinery while taking prescription pain medicine.  Do not drive for 24 hours if you received a sedative. Lifestyle  Do not drink alcohol. This is especially important if you are breastfeeding or taking pain medicine.  Do not use tobacco products, including cigarettes, chewing tobacco, or e-cigarettes. If you need help quitting, ask your health care provider. Tobacco can delay wound healing. Eating and drinking  Drink at least 8 eight-ounce glasses of water every day unless told not to by your health care provider. If you breastfeed, you may need to drink more water than this.  Eat high-fiber foods every day. These foods may help prevent or relieve constipation. High-fiber foods include:  Whole grain cereals and breads.  Brown rice.  Beans.  Fresh fruits and vegetables. Activity  Return to your normal activities as told by your health care provider. Ask your health care provider what activities are safe for you.  Rest as much as possible. Try to rest or take a nap while your baby is sleeping.  Do not lift anything that is heavier than your baby or 10 lb (4.5 kg) as told by your health care provider.  Ask your health care provider when you can engage in sexual activity. This may depend on your:  Risk of infection.  Healing rate.  Comfort and desire to engage in sexual activity. Bathing  Do not take baths, swim, or use a hot tub until your  health care provider approves. Ask your health care provider if you can take showers. You may only be allowed to take sponge baths until your incision heals.  Keep your dressing dry as told by your health care provider. General instructions  Do not use tampons or douches until your health care provider approves.  Wear:  Loose, comfortable clothing.  A supportive and well-fitting bra.  Watch for any  blood clots that may pass from your vagina. These may look like clumps of dark red, brown, or black discharge.  Keep your perineum clean and dry as told by your health care provider.  Wipe from front to back when you use the toilet.  If possible, have someone help you care for your baby and help with household activities for a few days after you leave the hospital.  Keep all follow-up visits for you and your baby as told by your health care provider. This is important. Contact a health care provider if:  You have:  Bad-smelling vaginal discharge.  Difficulty urinating.  Pain when urinating.  A sudden increase or decrease in the frequency of your bowel movements.  More redness, swelling, or pain around your incision.  More fluid or blood coming from your incision.  Pus or a bad smell coming from your incision.  A fever.  A rash.  Little or no interest in activities you used to enjoy.  Questions about caring for yourself or your baby.  Nausea.  Your incision feels warm to the touch.  Your breasts turn red or become painful or hard.  You feel unusually sad or worried.  You vomit.  You pass large blood clots from your vagina. If you pass a blood clot, save it to show to your health care provider. Do not flush blood clots down the toilet without showing your health care provider.  You urinate more than usual.  You are dizzy or light-headed.  You have not breastfed and have not had a menstrual period for 12 weeks after delivery.  You stopped breastfeeding and have not had a menstrual period for 12 weeks after stopping breastfeeding. Get help right away if:  You have:  Pain that does not go away or get better with medicine.  Chest pain.  Difficulty breathing.  Blurred vision or spots in your vision.  Thoughts about hurting yourself or your baby.  New pain in your abdomen or in one of your legs.  A severe headache.  You faint.  You bleed from your  vagina so much that you fill two sanitary pads in one hour. This information is not intended to replace advice given to you by your health care provider. Make sure you discuss any questions you have with your health care provider. Document Released: 09/30/2001 Document Revised: 05/19/2015 Document Reviewed: 12/13/2014 Elsevier Interactive Patient Education  2017 [employer].

## 2015-12-24 NOTE — Discharge Summary (Signed)
OB Discharge Summary     Patient Name: Laura Rowland DOB: Aug 23, 1975 MRN: [id number]  Date of admission: 12/21/2015 Delivering MD: Christophe Louis   Date of discharge: 12/24/2015  Admitting diagnosis: 37 wks ctx every 4 min Intrauterine pregnancy: [redacted]w[redacted]d     Secondary diagnosis:  Active Problems:   S/P cesarean section  Additional problems: none     Discharge diagnosis: Term Pregnancy Delivered                                                                                                Post partum procedures:none  Augmentation: N/A  Complications: None  Hospital course:  Onset of Labor With Unplanned C/S  40 y.o. yo [id number] at [redacted]w[redacted]d was admitted in Latent Labor on 12/21/2015. Due to prior C-section, she desires a repeat and did not want a TOLAC.  The patient went for cesarean section due to Elective Repeat, and delivered a Viable infant,12/21/2015  Details of operation can be found in separate operative note. Patient had an uncomplicated postpartum course.  She is ambulating,tolerating a regular diet, passing flatus, and urinating well.  Patient is discharged home in stable condition 12/24/15.   Physical exam Vitals:   12/22/15 1800 12/23/15 0524 12/23/15 1713 12/24/15 0620  BP: 133/63 (!) 145/72 134/74 114/77  Pulse: 83 78 88 81  Resp: 18 18 18 16   Temp: 98.6 F (37 C) 97.8 F (36.6 C) 98.1 F (36.7 C) 98.1 F (36.7 C)  TempSrc: Oral Oral  Oral  SpO2:      Weight:      Height:       General: alert, cooperative and no distress Lochia: appropriate Uterine Fundus: firm Incision: Dressing is clean, dry, and intact DVT Evaluation: No evidence of DVT seen on physical exam. Labs: Lab Results  Component Value Date   WBC 8.2 12/22/2015   HGB 9.6 (L) 12/22/2015   HCT 29.2 (L) 12/22/2015   MCV 73.2 (L) 12/22/2015   PLT 151 12/22/2015   CMP Latest Ref Rng & Units 11/03/2012  Glucose 70 - 99 mg/dL 92  BUN 6 - 23 mg/dL 10  Creatinine 0.50 - 1.10 mg/dL 0.78  Sodium 135  - 145 mEq/L 136  Potassium 3.5 - 5.1 mEq/L 4.3  Chloride 96 - 112 mEq/L 100  CO2 19 - 32 mEq/L 27  Calcium 8.4 - 10.5 mg/dL 9.1    Discharge instruction: per After Visit Summary and "Baby and Me Booklet".  After visit meds:    Medication List    TAKE these medications   esomeprazole 40 MG capsule Commonly known as:  NEXIUM Take 30- 60 min before your first and last meals of the day What changed:  how much to take  how to take this  when to take this  additional instructions   hydroxychloroquine 200 MG tablet Commonly known as:  PLAQUENIL Take 200 mg by mouth 3 (three) times daily. Pt takes 2 tablets 400mg  in the morning and one tablet 200mg  at night   ibuprofen 600 MG tablet Commonly known as:  ADVIL,MOTRIN Take 1 tablet (600 mg total) by  mouth every 6 (six) hours.   IRON PO Take 1 tablet by mouth daily.   oxyCODONE 5 MG immediate release tablet Commonly known as:  Oxy IR/ROXICODONE Take 1 tablet (5 mg total) by mouth every 6 (six) hours as needed (pain scale 4-7).   PNV PO Take 1 tablet by mouth daily.   PROAIR HFA 108 (90 Base) MCG/ACT inhaler Generic drug:  albuterol Inhale 2 puffs into the lungs every 4 (four) hours as needed for wheezing or shortness of breath.   valACYclovir 500 MG tablet Commonly known as:  VALTREX Take 500 mg by mouth daily.   Vitamin D 2000 units tablet Take 2,000 Units by mouth daily.       Diet: routine diet  Activity: Advance as tolerated. Pelvic rest for 6 weeks.   Outpatient follow up:2 weeks Follow up Appt:No future appointments. Follow up Visit:No Follow-up on file.  Postpartum contraception: Not Discussed  Newborn Data: Live born female  Birth Weight: 5 lb 10.7 oz (2570 g) APGAR: 9, 9  Baby Feeding: Bottle Disposition:home with mother   12/24/2015 Janyth Pupa, M, DO

## 2015-12-24 NOTE — Progress Notes (Signed)
Subjective: Postpartum Day 3: Cesarean Delivery Doing well today and reports no acute complaints.  Pain controlled.  No nausea/vomiting.  Lochia minimal.  No F/C/CP/SOB.  +flatus, BM.  Pt having issues with breastfeeding- "milk not coming in."  Currently trying to pump and supplementing.  She does report left breast engorgement  Objective: Vital signs in last 24 hours: Temp:  [98.1 F (36.7 C)] 98.1 F (36.7 C) (12/02 0620) Pulse Rate:  [81-88] 81 (12/02 0620) Resp:  [16-18] 16 (12/02 0620) BP: (114-134)/(74-77) 114/77 (12/02 [id number])  Physical Exam:  General: alert and cooperative  CV: RRR Left Breast: No erythema or pain.  No discrete mass appreciated on exam. Lungs: CTAB Abd: soft, uterus non-tender, below umbilicus, +BS Lochia: appropriate Incision: bandage clean dry and intact  DVT Evaluation: No evidence of DVT seen on physical exam.   Recent Labs  12/22/15 0519  HGB 9.6*  HCT 29.2*    Assessment/Plan: 40yo [id number] s/p repeat C-section, POD #3 -pain well controlled -meeting postoperative milestones appropriately - GI: acid reflux better controlled with current medication -Plan for lactation support prior to discharge  Dispo: Meeting milestones appropriately, plan for discharge home today.  Janyth Pupa, M 12/24/2015, 7:34 AM

## 2015-12-24 NOTE — Lactation Note (Signed)
This note was copied from a baby's chart. Lactation Consultation Note  Patient Name: Laura Rowland [id number] Date: 12/24/2015  Follow up visit made prior to discharge.  Engorgement has improved.  Breasts are full but soft this AM. Patient has been icing and pumping and recently pumped 20 mls from right breast and none from left.  I assisted mom with setting up her Medela Pump In Style.  Good flow noted from right breast and a few mls from left.  Flange size changed to a 27 mm on left.  Breast massaged during pumping.  Recommended she switch to heat prior to and during pumping along with massage.  Instructed to pump every 2-3 hours until milk is dripping.  Reviewed lactation services and support and encouraged to call prn.   Maternal Data    Feeding Feeding Type: Breast Milk  LATCH Score/Interventions                      Lactation Tools Discussed/Used     Consult Status      Ave Filter 12/24/2015, 10:21 AM

## 2015-12-25 DIAGNOSIS — R062 Wheezing: Secondary | ICD-10-CM | POA: Diagnosis not present

## 2015-12-25 DIAGNOSIS — Z00110 Health examination for newborn under 8 days old: Secondary | ICD-10-CM | POA: Diagnosis not present

## 2015-12-25 DIAGNOSIS — R05 Cough: Secondary | ICD-10-CM | POA: Diagnosis not present

## 2015-12-28 ENCOUNTER — Encounter (HOSPITAL_COMMUNITY): Payer: Self-pay

## 2015-12-28 ENCOUNTER — Inpatient Hospital Stay (HOSPITAL_COMMUNITY)
Admission: AD | Admit: 2015-12-28 | Discharge: 2016-01-07 | DRG: 776 | Disposition: A | Discharge status: Home / Self Care | Payer: Medicare HMO | Source: Ambulatory Visit | Attending: Internal Medicine | Admitting: Internal Medicine

## 2015-12-28 ENCOUNTER — Inpatient Hospital Stay (HOSPITAL_COMMUNITY): Payer: Medicare HMO

## 2015-12-28 DIAGNOSIS — J9601 Acute respiratory failure with hypoxia: Secondary | ICD-10-CM

## 2015-12-28 DIAGNOSIS — O99215 Obesity complicating the puerperium: Secondary | ICD-10-CM | POA: Diagnosis not present

## 2015-12-28 DIAGNOSIS — F419 Anxiety disorder, unspecified: Secondary | ICD-10-CM | POA: Diagnosis present

## 2015-12-28 DIAGNOSIS — Z6835 Body mass index (BMI) 35.0-35.9, adult: Secondary | ICD-10-CM | POA: Diagnosis not present

## 2015-12-28 DIAGNOSIS — J4520 Mild intermittent asthma, uncomplicated: Secondary | ICD-10-CM | POA: Diagnosis present

## 2015-12-28 DIAGNOSIS — K5900 Constipation, unspecified: Secondary | ICD-10-CM | POA: Diagnosis not present

## 2015-12-28 DIAGNOSIS — Z79899 Other long term (current) drug therapy: Secondary | ICD-10-CM

## 2015-12-28 DIAGNOSIS — E876 Hypokalemia: Secondary | ICD-10-CM | POA: Diagnosis not present

## 2015-12-28 DIAGNOSIS — M349 Systemic sclerosis, unspecified: Secondary | ICD-10-CM | POA: Diagnosis present

## 2015-12-28 DIAGNOSIS — R05 Cough: Secondary | ICD-10-CM | POA: Diagnosis not present

## 2015-12-28 DIAGNOSIS — J180 Bronchopneumonia, unspecified organism: Secondary | ICD-10-CM | POA: Diagnosis not present

## 2015-12-28 DIAGNOSIS — R0602 Shortness of breath: Secondary | ICD-10-CM

## 2015-12-28 DIAGNOSIS — O1495 Unspecified pre-eclampsia, complicating the puerperium: Secondary | ICD-10-CM | POA: Diagnosis not present

## 2015-12-28 DIAGNOSIS — J9600 Acute respiratory failure, unspecified whether with hypoxia or hypercapnia: Secondary | ICD-10-CM | POA: Diagnosis not present

## 2015-12-28 DIAGNOSIS — J849 Interstitial pulmonary disease, unspecified: Secondary | ICD-10-CM | POA: Diagnosis not present

## 2015-12-28 DIAGNOSIS — O9089 Other complications of the puerperium, not elsewhere classified: Secondary | ICD-10-CM | POA: Diagnosis present

## 2015-12-28 DIAGNOSIS — I5031 Acute diastolic (congestive) heart failure: Secondary | ICD-10-CM | POA: Diagnosis not present

## 2015-12-28 DIAGNOSIS — O99345 Other mental disorders complicating the puerperium: Secondary | ICD-10-CM | POA: Diagnosis present

## 2015-12-28 DIAGNOSIS — J449 Chronic obstructive pulmonary disease, unspecified: Secondary | ICD-10-CM | POA: Diagnosis not present

## 2015-12-28 DIAGNOSIS — J181 Lobar pneumonia, unspecified organism: Secondary | ICD-10-CM | POA: Diagnosis not present

## 2015-12-28 DIAGNOSIS — J810 Acute pulmonary edema: Secondary | ICD-10-CM | POA: Diagnosis not present

## 2015-12-28 DIAGNOSIS — K219 Gastro-esophageal reflux disease without esophagitis: Secondary | ICD-10-CM | POA: Diagnosis present

## 2015-12-28 DIAGNOSIS — J811 Chronic pulmonary edema: Secondary | ICD-10-CM | POA: Diagnosis not present

## 2015-12-28 DIAGNOSIS — R0902 Hypoxemia: Secondary | ICD-10-CM | POA: Diagnosis not present

## 2015-12-28 DIAGNOSIS — R0600 Dyspnea, unspecified: Secondary | ICD-10-CM | POA: Diagnosis not present

## 2015-12-28 DIAGNOSIS — E669 Obesity, unspecified: Secondary | ICD-10-CM | POA: Diagnosis present

## 2015-12-28 LAB — COMPREHENSIVE METABOLIC PANEL
ALK PHOS: 125 U/L (ref 38–126)
ALT: 31 U/L (ref 14–54)
ANION GAP: 8 (ref 5–15)
AST: 40 U/L (ref 15–41)
Albumin: 3.1 g/dL — ABNORMAL LOW (ref 3.5–5.0)
BUN: 14 mg/dL (ref 6–20)
CALCIUM: 8.8 mg/dL — AB (ref 8.9–10.3)
CO2: 23 mmol/L (ref 22–32)
Chloride: 109 mmol/L (ref 101–111)
Creatinine, Ser: 0.79 mg/dL (ref 0.44–1.00)
GFR calc non Af Amer: >60 mL/min (ref >60)
Glucose, Bld: 84 mg/dL (ref 65–99)
Potassium: 3.8 mmol/L (ref 3.5–5.1)
SODIUM: 140 mmol/L (ref 135–145)
Total Bilirubin: 0.6 mg/dL (ref 0.3–1.2)
Total Protein: 6.9 g/dL (ref 6.5–8.1)

## 2015-12-28 LAB — CBC
HCT: 30.3 % — ABNORMAL LOW (ref 36.0–46.0)
HEMOGLOBIN: 9.8 g/dL — AB (ref 12.0–15.0)
MCH: 23.9 pg — AB (ref 26.0–34.0)
MCHC: 32.3 g/dL (ref 30.0–36.0)
MCV: 73.9 fL — ABNORMAL LOW (ref 78.0–100.0)
PLATELETS: 209 10*3/uL (ref 150–400)
RBC: 4.1 MIL/uL (ref 3.87–5.11)
RDW: 16.4 % — ABNORMAL HIGH (ref 11.5–15.5)
WBC: 6 10*3/uL (ref 4.0–10.5)

## 2015-12-28 LAB — URINALYSIS, ROUTINE W REFLEX MICROSCOPIC
BILIRUBIN URINE: NEGATIVE
GLUCOSE, UA: NEGATIVE mg/dL
KETONES UR: NEGATIVE mg/dL
NITRITE: NEGATIVE
PH: 7 (ref 5.0–8.0)
Protein, ur: NEGATIVE mg/dL
Specific Gravity, Urine: 1.005 (ref 1.005–1.030)

## 2015-12-28 LAB — URIC ACID: Uric Acid, Serum: 7.7 mg/dL — ABNORMAL HIGH (ref 2.3–6.6)

## 2015-12-28 LAB — PROTEIN / CREATININE RATIO, URINE
Creatinine, Urine: 27 mg/dL
Creatinine, Urine: 23 mg/dL
PROTEIN CREATININE RATIO: 0.26 mg/mg{creat} — AB (ref 0.00–0.15)
TOTAL PROTEIN, URINE: 7 mg/dL

## 2015-12-28 MED ORDER — LABETALOL HCL 5 MG/ML IV SOLN
20.0000 mg | INTRAVENOUS | Status: AC | PRN
  Administered 2015-12-28: 40 mg via INTRAVENOUS
  Administered 2015-12-28: 80 mg via INTRAVENOUS
  Administered 2015-12-28: 20 mg via INTRAVENOUS
  Filled 2015-12-28: qty 4
  Filled 2015-12-28: qty 8
  Filled 2015-12-28: qty 16

## 2015-12-28 MED ORDER — DEXTROSE 5 % IV SOLN
1.0000 g | INTRAVENOUS | Status: DC
  Administered 2015-12-29 – 2015-12-31 (×3): 1 g via INTRAVENOUS
  Filled 2015-12-28 (×4): qty 10

## 2015-12-28 MED ORDER — LABETALOL HCL 5 MG/ML IV SOLN: 20.0000 mg | INTRAVENOUS | Status: DC | PRN

## 2015-12-28 MED ORDER — MAGNESIUM SULFATE BOLUS VIA INFUSION
4.0000 g | Freq: Once | INTRAVENOUS | Status: AC
  Administered 2015-12-28: 4 g via INTRAVENOUS
  Filled 2015-12-28: qty 500

## 2015-12-28 MED ORDER — AZITHROMYCIN 500 MG IV SOLR
500.0000 mg | INTRAVENOUS | Status: DC
  Administered 2015-12-29 – 2015-12-30 (×2): 500 mg via INTRAVENOUS
  Filled 2015-12-28 (×2): qty 500

## 2015-12-28 MED ORDER — HYDRALAZINE HCL 20 MG/ML IJ SOLN
10.0000 mg | Freq: Once | INTRAMUSCULAR | Status: AC | PRN
  Administered 2015-12-28: 10 mg via INTRAVENOUS
  Filled 2015-12-28: qty 1

## 2015-12-28 MED ORDER — OXYCODONE HCL 5 MG PO TABS
10.0000 mg | ORAL_TABLET | Freq: Once | ORAL | Status: AC
  Administered 2015-12-28: 10 mg via ORAL
  Filled 2015-12-28: qty 2

## 2015-12-28 MED ORDER — IBUPROFEN 600 MG PO TABS
600.0000 mg | ORAL_TABLET | Freq: Four times a day (QID) | ORAL | Status: DC
  Administered 2015-12-28 – 2016-01-07 (×30): 600 mg via ORAL
  Filled 2015-12-28 (×2): qty 3
  Filled 2015-12-28: qty 1
  Filled 2015-12-28: qty 3
  Filled 2015-12-28: qty 1
  Filled 2015-12-28 (×3): qty 3
  Filled 2015-12-28: qty 1
  Filled 2015-12-28: qty 3
  Filled 2015-12-28: qty 1
  Filled 2015-12-28: qty 3
  Filled 2015-12-28 (×2): qty 1
  Filled 2015-12-28 (×5): qty 3
  Filled 2015-12-28 (×3): qty 1
  Filled 2015-12-28: qty 3
  Filled 2015-12-28 (×3): qty 1
  Filled 2015-12-28: qty 3
  Filled 2015-12-28 (×5): qty 1

## 2015-12-28 MED ORDER — LACTATED RINGERS IV SOLN
2.0000 g/h | INTRAVENOUS | Status: AC
  Administered 2015-12-28 – 2015-12-29 (×2): 2 g/h via INTRAVENOUS
  Filled 2015-12-28 (×2): qty 80

## 2015-12-28 MED ORDER — HYDRALAZINE HCL 20 MG/ML IJ SOLN: 10.0000 mg | Freq: Once | INTRAMUSCULAR | Status: DC | PRN

## 2015-12-28 MED ORDER — LACTATED RINGERS IV SOLN
INTRAVENOUS | Status: DC
  Administered 2015-12-28 – 2015-12-29 (×2): via INTRAVENOUS

## 2015-12-28 NOTE — MAU Provider Note (Signed)
History     CSN: [id number]  Arrival date and time: 12/28/15 1837   None     Chief Complaint  Patient presents with  . Fever  . Shortness of Breath  . Leg Swelling   [id number], 1 week s/p repeat CS here with multiple sx. She reports worsening LE edema about 3 days ago after she was discharged from the hospital. She reports onset of SOB last night. She then had onset of dizziness and HA today. She took Percocet and had no relief. She also reports upper abdominal pain bilateral that radiates to her back. Review of chart indicates pregnancy and postpartum course was uncomplicated.    OB History    Gravida Para Term Preterm AB Living   4 2 1 1 2 2    SAB TAB Ectopic Multiple Live Births   1 1   0 2      Past Medical History:  Diagnosis Date  . Anemia   . [facility])     Past Surgical History:  Procedure Laterality Date  . BREAST CYST EXCISION  1996  . CESAREAN SECTION    . CESAREAN SECTION N/A 12/21/2015   Procedure: CESAREAN SECTION;  Surgeon: Christophe Louis, MD;  Location: [place];  Service: Obstetrics;  Laterality: N/A;  . CYST REMOVAL HAND  2008  . DILATION AND EVACUATION N/A 06/28/2014   Procedure: DILATATION AND EVACUATION;  Surgeon: Christophe Louis, MD;  Location: [facility];  Service: Gynecology;  Laterality: N/A;  . WISDOM TOOTH EXTRACTION      Family History  Problem Relation Age of Onset  . Diabetes Father     Social History  Substance Use Topics  . Smoking status: Never Smoker  . Smokeless tobacco: Never Used  . Alcohol use No     Comment: rarely    Allergies: No Known Allergies  Prescriptions Prior to Admission  Medication Sig Dispense Refill Last Dose  . Cholecalciferol (VITAMIN D) 2000 UNITS tablet Take 2,000 Units by mouth daily.   12/20/2015 at Unknown time  . esomeprazole (NEXIUM) 40 MG capsule Take 30- 60 min before your first and last meals of the day (Patient taking differently: Take 40 mg by mouth 2 (two) times daily before a meal. Take  30- 60 min before your first and last meals of the day)   12/20/2015 at Unknown time  . hydroxychloroquine (PLAQUENIL) 200 MG tablet Take 200 mg by mouth 3 (three) times daily. Pt takes 2 tablets 400mg  in the morning and one tablet 200mg  at night   12/20/2015 at Unknown time  . ibuprofen (ADVIL,MOTRIN) 600 MG tablet Take 1 tablet (600 mg total) by mouth every 6 (six) hours. 30 tablet 0   . IRON PO Take 1 tablet by mouth daily.    12/20/2015 at Unknown time  . oxyCODONE (OXY IR/ROXICODONE) 5 MG immediate release tablet Take 1 tablet (5 mg total) by mouth every 6 (six) hours as needed (pain scale 4-7). 30 tablet 0   . Prenatal Vit w/Fe-Methylfol-FA (PNV PO) Take 1 tablet by mouth daily.    12/20/2015 at Unknown time  . PROAIR HFA 108 (90 Base) MCG/ACT inhaler Inhale 2 puffs into the lungs every 4 (four) hours as needed for wheezing or shortness of breath.    Past Month at Unknown time  . valACYclovir (VALTREX) 500 MG tablet Take 500 mg by mouth daily.   12/20/2015 at Unknown time    Review of Systems  Constitutional: Negative.   Eyes: Negative.  Respiratory: Positive for shortness of breath.   Cardiovascular: Negative.   Gastrointestinal: Positive for abdominal pain and nausea. Negative for vomiting.  Neurological: Positive for dizziness.   Physical Exam   Blood pressure 159/84, pulse 81, temperature 98.1 F (36.7 C), temperature source Oral, resp. rate 18, SpO2 92 %, unknown if currently breastfeeding.  Patient Vitals for the past 24 hrs:  BP Temp Temp src Pulse Resp SpO2  12/28/15 2030 165/95 - - 78 - -  12/28/15 2015 180/99 - - 77 - -  12/28/15 1945 152/86 - - 79 - -  12/28/15 1930 162/85 - - 81 - -  12/28/15 1910 159/84 98.1 F (36.7 C) Oral 81 18 92 %    Physical Exam  Constitutional: She is oriented to person, place, and time. She appears well-developed and well-nourished. No distress.  HENT:  Head: Normocephalic and atraumatic.  Neck: Normal range of motion. Neck supple.    Cardiovascular: Normal rate and regular rhythm.   Respiratory: Breath sounds normal. Accessory muscle usage (mild) present.  GI: Soft. Bowel sounds are normal. She exhibits no distension and no mass. There is no tenderness. There is no rebound and no guarding.  Musculoskeletal: Normal range of motion. She exhibits edema (1+ LE).  Neurological: She is alert and oriented to person, place, and time. She has normal reflexes.  Skin: Skin is warm and dry.     Low transverse incision well approximated, c/d/i  Psychiatric: She has a normal mood and affect.   Results for orders placed or performed during the hospital encounter of 12/28/15 (from the past 24 hour(s))  Urinalysis, Routine w reflex microscopic     Status: Abnormal   Collection Time: 12/28/15  7:15 PM  Result Value Ref Range   Color, Urine STRAW (A) YELLOW   APPearance CLEAR CLEAR   Specific Gravity, Urine 1.005 1.005 - 1.030   pH 7.0 5.0 - 8.0   Glucose, UA NEGATIVE NEGATIVE mg/dL   Hgb urine dipstick LARGE (A) NEGATIVE   Bilirubin Urine NEGATIVE NEGATIVE   Ketones, ur NEGATIVE NEGATIVE mg/dL   Protein, ur NEGATIVE NEGATIVE mg/dL   Nitrite NEGATIVE NEGATIVE   Leukocytes, UA MODERATE (A) NEGATIVE   RBC / HPF 0-5 0 - 5 RBC/hpf   WBC, UA 6-30 0 - 5 WBC/hpf   Bacteria, UA RARE (A) NONE SEEN   Squamous Epithelial / LPF 0-5 (A) NONE SEEN  Protein / creatinine ratio, urine     Status: Abnormal   Collection Time: 12/28/15  7:15 PM  Result Value Ref Range   Creatinine, Urine 27.00 mg/dL   Total Protein, Urine 7 mg/dL   Protein Creatinine Ratio 0.26 (H) 0.00 - 0.15 mg/mg[Cre]  CBC     Status: Abnormal   Collection Time: 12/28/15  7:36 PM  Result Value Ref Range   WBC 6.0 4.0 - 10.5 K/uL   RBC 4.10 3.87 - 5.11 MIL/uL   Hemoglobin 9.8 (L) 12.0 - 15.0 g/dL   HCT 30.3 (L) 36.0 - 46.0 %   MCV 73.9 (L) 78.0 - 100.0 fL   MCH 23.9 (L) 26.0 - 34.0 pg   MCHC 32.3 30.0 - 36.0 g/dL   RDW 16.4 (H) 11.5 - 15.5 %   Platelets 209 150 - 400  K/uL  Comprehensive metabolic panel     Status: Abnormal   Collection Time: 12/28/15  7:36 PM  Result Value Ref Range   Sodium 140 135 - 145 mmol/L   Potassium 3.8 3.5 - 5.1 mmol/L  Chloride 109 101 - 111 mmol/L   CO2 23 22 - 32 mmol/L   Glucose, Bld 84 65 - 99 mg/dL   BUN 14 6 - 20 mg/dL   Creatinine, Ser 0.79 0.44 - 1.00 mg/dL   Calcium 8.8 (L) 8.9 - 10.3 mg/dL   Total Protein 6.9 6.5 - 8.1 g/dL   Albumin 3.1 (L) 3.5 - 5.0 g/dL   AST 40 15 - 41 U/L   ALT 31 14 - 54 U/L   Alkaline Phosphatase 125 38 - 126 U/L   Total Bilirubin 0.6 0.3 - 1.2 mg/dL   GFR calc non Af Amer >60 >60 mL/min   GFR calc Af Amer >60 >60 mL/min   Anion gap 8 5 - 15    MAU Course  Procedures  MDM Labs ordered and reviewed. Presentation, clinical findings, and plan discussed with Dr. Mancel Bale. Plan for admit.   Assessment and Plan   1. Preeclampsia in postpartum period   2. Shortness of breath    Admit CXR Magnesium Sulfate for eclampsia prophylaxis Labetalol protocol Management per Dr. Alfonzo Feller, [name] 12/28/2015, 7:37 PM

## 2015-12-28 NOTE — MAU Note (Signed)
Pt presents to MAU PP c/s 11/29 with shortness of breath, fever/chills, swollen legs/feet and abdominal pain that radiates into back. Reports it started on Sunday and has progressively gotten worse. Has not taken anything for pain or fever. Pt also reports h/a, and epigastric pain.

## 2015-12-29 LAB — HIV ANTIBODY (ROUTINE TESTING W REFLEX): HIV SCREEN 4TH GENERATION: NONREACTIVE

## 2015-12-29 LAB — STREP PNEUMONIAE URINARY ANTIGEN: Strep Pneumo Urinary Antigen: NEGATIVE

## 2015-12-29 MED ORDER — PANTOPRAZOLE SODIUM 40 MG PO TBEC
40.0000 mg | DELAYED_RELEASE_TABLET | Freq: Every day | ORAL | Status: DC
  Filled 2015-12-29: qty 1

## 2015-12-29 MED ORDER — OXYCODONE-ACETAMINOPHEN 5-325 MG PO TABS
1.0000 | ORAL_TABLET | ORAL | Status: DC | PRN
  Administered 2015-12-29 – 2016-01-01 (×5): 1 via ORAL
  Filled 2015-12-29 (×5): qty 1

## 2015-12-29 MED ORDER — PANTOPRAZOLE SODIUM 40 MG PO TBEC: 40.0000 mg | DELAYED_RELEASE_TABLET | Freq: Two times a day (BID) | ORAL | Status: DC

## 2015-12-29 MED ORDER — ALBUTEROL SULFATE (2.5 MG/3ML) 0.083% IN NEBU
2.5000 mg | INHALATION_SOLUTION | Freq: Four times a day (QID) | RESPIRATORY_TRACT | Status: DC | PRN
  Administered 2015-12-30 – 2016-01-01 (×7): 2.5 mg via RESPIRATORY_TRACT
  Filled 2015-12-29 (×10): qty 3

## 2015-12-29 MED ORDER — PANTOPRAZOLE SODIUM 40 MG PO TBEC
40.0000 mg | DELAYED_RELEASE_TABLET | Freq: Every day | ORAL | Status: DC
  Administered 2015-12-29: 40 mg via ORAL

## 2015-12-29 MED ORDER — OXYCODONE-ACETAMINOPHEN 5-325 MG PO TABS
2.0000 | ORAL_TABLET | ORAL | Status: DC | PRN
  Administered 2015-12-29 – 2016-01-02 (×4): 2 via ORAL
  Filled 2015-12-29 (×6): qty 2

## 2015-12-29 MED ORDER — ALBUTEROL SULFATE HFA 108 (90 BASE) MCG/ACT IN AERS: 2.0000 | INHALATION_SPRAY | RESPIRATORY_TRACT | Status: DC | PRN

## 2015-12-29 NOTE — H&P (Signed)
History     CSN: [id number]  Arrival date and time: 12/28/15 1837   None        Chief Complaint  Patient presents with  . Fever  . Shortness of Breath  . Leg Swelling   [id number], 1 week s/p repeat CS here with multiple sx. She reports worsening LE edema about 3 days ago after she was discharged from the hospital. She reports onset of SOB last night. She then had onset of dizziness and HA today. She took Percocet and had no relief. She also reports upper abdominal pain bilateral that radiates to her back. Review of chart indicates pregnancy and postpartum course was uncomplicated.           OB History    Gravida Para Term Preterm AB Living   4 2 1 1 2 2    SAB TAB Ectopic Multiple Live Births   1 1   0 2          Past Medical History:  Diagnosis Date  . Anemia   . Scleredema [facility])          Past Surgical History:  Procedure Laterality Date  . BREAST CYST EXCISION  1996  . CESAREAN SECTION    . CESAREAN SECTION N/A 12/21/2015   Procedure: CESAREAN SECTION;  Surgeon: Christophe Louis, MD;  Location: [place];  Service: Obstetrics;  Laterality: N/A;  . CYST REMOVAL HAND  2008  . DILATION AND EVACUATION N/A 06/28/2014   Procedure: DILATATION AND EVACUATION;  Surgeon: Christophe Louis, MD;  Location: [place] [facility];  Service: Gynecology;  Laterality: N/A;  . WISDOM TOOTH EXTRACTION      Family History  Problem Relation Age of Onset  . Diabetes Father           Social History  Substance Use Topics  . Smoking status: Never Smoker  . Smokeless tobacco: Never Used  . Alcohol use No     Comment: rarely    Allergies: No Known Allergies         Prescriptions Prior to Admission  Medication Sig Dispense Refill Last Dose  . Cholecalciferol (VITAMIN D) 2000 UNITS tablet Take 2,000 Units by mouth daily.   12/20/2015 at Unknown time  . esomeprazole (NEXIUM) 40 MG capsule Take 30- 60 min before your first and last meals of the day (Patient taking  differently: Take 40 mg by mouth 2 (two) times daily before a meal. Take 30- 60 min before your first and last meals of the day)   12/20/2015 at Unknown time  . hydroxychloroquine (PLAQUENIL) 200 MG tablet Take 200 mg by mouth 3 (three) times daily. Pt takes 2 tablets 400mg  in the morning and one tablet 200mg  at night   12/20/2015 at Unknown time  . ibuprofen (ADVIL,MOTRIN) 600 MG tablet Take 1 tablet (600 mg total) by mouth every 6 (six) hours. 30 tablet 0   . IRON PO Take 1 tablet by mouth daily.    12/20/2015 at Unknown time  . oxyCODONE (OXY IR/ROXICODONE) 5 MG immediate release tablet Take 1 tablet (5 mg total) by mouth every 6 (six) hours as needed (pain scale 4-7). 30 tablet 0   . Prenatal Vit w/Fe-Methylfol-FA (PNV PO) Take 1 tablet by mouth daily.    12/20/2015 at Unknown time  . PROAIR HFA 108 (90 Base) MCG/ACT inhaler Inhale 2 puffs into the lungs every 4 (four) hours as needed for wheezing or shortness of breath.    Past Month at Unknown time  .  valACYclovir (VALTREX) 500 MG tablet Take 500 mg by mouth daily.   12/20/2015 at Unknown time    Review of Systems  Constitutional: Negative.   Eyes: Negative.   Respiratory: Positive for shortness of breath.   Cardiovascular: Negative.   Gastrointestinal: Positive for abdominal pain and nausea. Negative for vomiting.  Neurological: Positive for dizziness.   Physical Exam   Blood pressure 159/84, pulse 81, temperature 98.1 F (36.7 C), temperature source Oral, resp. rate 18, SpO2 92 %, unknown if currently breastfeeding.  Patient Vitals for the past 24 hrs:  BP Temp Temp src Pulse Resp SpO2  12/28/15 2030 165/95 - - 78 - -  12/28/15 2015 180/99 - - 77 - -  12/28/15 1945 152/86 - - 79 - -  12/28/15 1930 162/85 - - 81 - -  12/28/15 1910 159/84 98.1 F (36.7 C) Oral 81 18 92 %    Physical Exam  Constitutional: She is oriented to person, place, and time. She appears well-developed and well-nourished. No  distress.  HENT:  Head: Normocephalic and atraumatic.  Neck: Normal range of motion. Neck supple.  Cardiovascular: Normal rate and regular rhythm.   Respiratory: Breath sounds normal. Accessory muscle usage (mild) present.  GI: Soft. Bowel sounds are normal. She exhibits no distension and no mass. There is no tenderness. There is no rebound and no guarding.  Musculoskeletal: Normal range of motion. She exhibits edema (1+ LE).  Neurological: She is alert and oriented to person, place, and time. She has normal reflexes.  Skin: Skin is warm and dry.     Low transverse incision well approximated, c/d/i  Psychiatric: She has a normal mood and affect.   Lab Results Last 24 Hours       Results for orders placed or performed during the hospital encounter of 12/28/15 (from the past 24 hour(s))  Urinalysis, Routine w reflex microscopic     Status: Abnormal   Collection Time: 12/28/15  7:15 PM  Result Value Ref Range   Color, Urine STRAW (A) YELLOW   APPearance CLEAR CLEAR   Specific Gravity, Urine 1.005 1.005 - 1.030   pH 7.0 5.0 - 8.0   Glucose, UA NEGATIVE NEGATIVE mg/dL   Hgb urine dipstick LARGE (A) NEGATIVE   Bilirubin Urine NEGATIVE NEGATIVE   Ketones, ur NEGATIVE NEGATIVE mg/dL   Protein, ur NEGATIVE NEGATIVE mg/dL   Nitrite NEGATIVE NEGATIVE   Leukocytes, UA MODERATE (A) NEGATIVE   RBC / HPF 0-5 0 - 5 RBC/hpf   WBC, UA 6-30 0 - 5 WBC/hpf   Bacteria, UA RARE (A) NONE SEEN   Squamous Epithelial / LPF 0-5 (A) NONE SEEN  Protein / creatinine ratio, urine     Status: Abnormal   Collection Time: 12/28/15  7:15 PM  Result Value Ref Range   Creatinine, Urine 27.00 mg/dL   Total Protein, Urine 7 mg/dL   Protein Creatinine Ratio 0.26 (H) 0.00 - 0.15 mg/mg[Cre]  CBC     Status: Abnormal   Collection Time: 12/28/15  7:36 PM  Result Value Ref Range   WBC 6.0 4.0 - 10.5 K/uL   RBC 4.10 3.87 - 5.11 MIL/uL   Hemoglobin 9.8 (L) 12.0 - 15.0 g/dL   HCT 30.3 (L)  36.0 - 46.0 %   MCV 73.9 (L) 78.0 - 100.0 fL   MCH 23.9 (L) 26.0 - 34.0 pg   MCHC 32.3 30.0 - 36.0 g/dL   RDW 16.4 (H) 11.5 - 15.5 %   Platelets 209 150 -  400 K/uL  Comprehensive metabolic panel     Status: Abnormal   Collection Time: 12/28/15  7:36 PM  Result Value Ref Range   Sodium 140 135 - 145 mmol/L   Potassium 3.8 3.5 - 5.1 mmol/L   Chloride 109 101 - 111 mmol/L   CO2 23 22 - 32 mmol/L   Glucose, Bld 84 65 - 99 mg/dL   BUN 14 6 - 20 mg/dL   Creatinine, Ser 0.79 0.44 - 1.00 mg/dL   Calcium 8.8 (L) 8.9 - 10.3 mg/dL   Total Protein 6.9 6.5 - 8.1 g/dL   Albumin 3.1 (L) 3.5 - 5.0 g/dL   AST 40 15 - 41 U/L   ALT 31 14 - 54 U/L   Alkaline Phosphatase 125 38 - 126 U/L   Total Bilirubin 0.6 0.3 - 1.2 mg/dL   GFR calc non Af Amer >60 >60 mL/min   GFR calc Af Amer >60 >60 mL/min   Anion gap 8 5 - 15      MAU Course  Procedures  MDM Labs ordered and reviewed. Presentation, clinical findings, and plan discussed with Dr. Mancel Bale. Plan for admit.   Assessment and Plan   1. Preeclampsia in postpartum period   2. Shortness of breath    Admit CXR Magnesium Sulfate for eclampsia prophylaxis Labetalol protocol Management per Dr. Mancel Bale   ADDENDUM:    X-ray showed left lower lobe consolidation, suspicious for pneumonia superimposed    on the known fibrotic lung disease. Pt informed of the results and amenable to antibiotics. Per Dr. Mancel Bale Rx    Ceftriaxone and Azithromycin q 24 hrs each.    Protonix ordered per pt's request.    Percocet for pain prn.    CCOB covering until [place], [name] 12/29/15, 1:21 AM

## 2015-12-29 NOTE — Progress Notes (Signed)
Subjective: Postpartum Day 8: Cesarean Delivery pt readmitted overnight with preeclampsia due to severe range bp and headache / also diagnosed with pneumonia.  Patient reports tolerating PO.  SOB and abdominal discomfort has improved.   Objective: Vital signs in last 24 hours: Temp:  [97.6 F (36.4 C)-99.2 F (37.3 C)] 97.8 F (36.6 C) (12/07 1533) Pulse Rate:  [69-91] 84 (12/07 1533) Resp:  [16-24] 18 (12/07 1533) BP: (111-180)/(55-108) 122/69 (12/07 1533) SpO2:  [92 %-99 %] 99 % (12/07 1400) Weight:  [117.9 kg (260 lb)] 117.9 kg (260 lb) (12/06 2213)  Physical Exam:  General: alert, cooperative and no distress  Lungs Clear to Auscultation bilaterally.  CV rrr Abdomen: soft nontender nondistended +BS  Lochia: appropriate Uterine Fundus: firm Incision: healing well DVT Evaluation: No evidence of DVT seen on physical exam. 1+ edema    Recent Labs  12/28/15 1936  HGB 9.8*  HCT 30.3*    Assessment/Plan: Severe preclampsia postpartum- pt is diuresing well continue magnesium Sulfate for 24 hours of therapy. Labetalol for severe range bp   Pneumonia continue azithromycin and rocephin   Laura Rowland J. 12/29/2015, 5:45 PM

## 2015-12-30 ENCOUNTER — Inpatient Hospital Stay (HOSPITAL_COMMUNITY)
Admission: RE | Admit: 2015-12-30 | Payer: Medicare HMO | Source: Ambulatory Visit | Admitting: Obstetrics and Gynecology

## 2015-12-30 ENCOUNTER — Inpatient Hospital Stay (HOSPITAL_COMMUNITY): Payer: Medicare HMO

## 2015-12-30 ENCOUNTER — Encounter (HOSPITAL_COMMUNITY): Admission: RE | Payer: Self-pay | Source: Ambulatory Visit

## 2015-12-30 ENCOUNTER — Encounter (HOSPITAL_COMMUNITY): Payer: Self-pay

## 2015-12-30 DIAGNOSIS — R0600 Dyspnea, unspecified: Secondary | ICD-10-CM

## 2015-12-30 DIAGNOSIS — J810 Acute pulmonary edema: Secondary | ICD-10-CM

## 2015-12-30 DIAGNOSIS — O1495 Unspecified pre-eclampsia, complicating the puerperium: Principal | ICD-10-CM

## 2015-12-30 LAB — COMPREHENSIVE METABOLIC PANEL
ALBUMIN: 3.1 g/dL — AB (ref 3.5–5.0)
ALBUMIN: 3 g/dL — AB (ref 3.5–5.0)
ALK PHOS: 110 U/L (ref 38–126)
ALT: 24 U/L (ref 14–54)
ALT: 22 U/L (ref 14–54)
ANION GAP: 10 (ref 5–15)
ANION GAP: 7 (ref 5–15)
AST: 25 U/L (ref 15–41)
AST: 23 U/L (ref 15–41)
Alkaline Phosphatase: 107 U/L (ref 38–126)
BILIRUBIN TOTAL: 0.6 mg/dL (ref 0.3–1.2)
BILIRUBIN TOTAL: 0.4 mg/dL (ref 0.3–1.2)
BUN: 10 mg/dL (ref 6–20)
BUN: 9 mg/dL (ref 6–20)
CALCIUM: 7.4 mg/dL — AB (ref 8.9–10.3)
CHLORIDE: 106 mmol/L (ref 101–111)
CO2: 22 mmol/L (ref 22–32)
CO2: 26 mmol/L (ref 22–32)
CREATININE: 0.73 mg/dL (ref 0.44–1.00)
Calcium: 7.5 mg/dL — ABNORMAL LOW (ref 8.9–10.3)
Chloride: 105 mmol/L (ref 101–111)
Creatinine, Ser: 0.79 mg/dL (ref 0.44–1.00)
GFR calc Af Amer: >60 mL/min (ref >60)
GFR calc Af Amer: >60 mL/min (ref >60)
GFR calc non Af Amer: >60 mL/min (ref >60)
GLUCOSE: 100 mg/dL — AB (ref 65–99)
GLUCOSE: 93 mg/dL (ref 65–99)
POTASSIUM: 3.6 mmol/L (ref 3.5–5.1)
Potassium: 3.7 mmol/L (ref 3.5–5.1)
SODIUM: 137 mmol/L (ref 135–145)
Sodium: 139 mmol/L (ref 135–145)
TOTAL PROTEIN: 7.3 g/dL (ref 6.5–8.1)
TOTAL PROTEIN: 7 g/dL (ref 6.5–8.1)

## 2015-12-30 LAB — ECHOCARDIOGRAM COMPLETE
E decel time: 187 msec
EERAT: 0.67
FS: 29 % (ref 28–44)
HEIGHTINCHES: 72 in
IV/PV OW: 1.04
LA diam end sys: 37 mm
LA diam index: 1.41 cm/m2
LASIZE: 37 mm
LV E/e' medial: 0.67
LVEEAVG: 0.67
LVELAT: 14.8 cm/s
LVOT VTI: 27.1 cm
LVOT area: 3.14 cm2
LVOT diameter: 20 mm
LVOT peak grad rest: 9 mmHg
LVOT peak vel: 154 cm/s
LVOTSV: 85 mL
Lateral S' vel: 13.7 cm/s
MV Dec: 187
MV pk E vel: 9.9 m/s
MVPKAVEL: 92.1 m/s
PV Reg grad dias: 11 mmHg
PV Reg vel dias: 164 cm/s
PW: 13.3 mm — AB (ref 0.6–1.1)
TAPSE: 25.7 mm
TDI e' lateral: 14.8
TDI e' medial: 9.79
WEIGHTICAEL: 4160 [oz_av]

## 2015-12-30 LAB — CBC WITH DIFFERENTIAL/PLATELET
BASOS PCT: 0 %
Basophils Absolute: 0 10*3/uL (ref 0.0–0.1)
EOS PCT: 2 %
Eosinophils Absolute: 0.1 10*3/uL (ref 0.0–0.7)
HEMATOCRIT: 29.7 % — AB (ref 36.0–46.0)
Hemoglobin: 9.6 g/dL — ABNORMAL LOW (ref 12.0–15.0)
Lymphocytes Relative: 20 %
Lymphs Abs: 1.4 10*3/uL (ref 0.7–4.0)
MCH: 23.8 pg — ABNORMAL LOW (ref 26.0–34.0)
MCHC: 32.3 g/dL (ref 30.0–36.0)
MCV: 73.7 fL — ABNORMAL LOW (ref 78.0–100.0)
MONO ABS: 0.2 10*3/uL (ref 0.1–1.0)
MONOS PCT: 3 %
NEUTROS ABS: 5.1 10*3/uL (ref 1.7–7.7)
Neutrophils Relative %: 75 %
PLATELETS: 198 10*3/uL (ref 150–400)
RBC: 4.03 MIL/uL (ref 3.87–5.11)
RDW: 16.7 % — AB (ref 11.5–15.5)
WBC: 6.8 10*3/uL (ref 4.0–10.5)

## 2015-12-30 LAB — MRSA PCR SCREENING: MRSA BY PCR: NEGATIVE

## 2015-12-30 LAB — MAGNESIUM: MAGNESIUM: 2.7 mg/dL — AB (ref 1.7–2.4)

## 2015-12-30 LAB — PHOSPHORUS: Phosphorus: 5.1 mg/dL — ABNORMAL HIGH (ref 2.5–4.6)

## 2015-12-30 LAB — PROCALCITONIN

## 2015-12-30 SURGERY — Surgical Case
Anesthesia: Regional

## 2015-12-30 MED ORDER — FUROSEMIDE 10 MG/ML IJ SOLN
40.0000 mg | Freq: Once | INTRAMUSCULAR | Status: AC
  Administered 2015-12-30: 40 mg via INTRAVENOUS
  Filled 2015-12-30: qty 4

## 2015-12-30 MED ORDER — AZITHROMYCIN 250 MG PO TABS
250.0000 mg | ORAL_TABLET | Freq: Every day | ORAL | Status: DC
  Administered 2015-12-30 – 2015-12-31 (×2): 250 mg via ORAL
  Filled 2015-12-30 (×2): qty 1

## 2015-12-30 MED ORDER — PANTOPRAZOLE SODIUM 40 MG PO TBEC
40.0000 mg | DELAYED_RELEASE_TABLET | Freq: Two times a day (BID) | ORAL | Status: DC
  Administered 2015-12-30 – 2016-01-07 (×18): 40 mg via ORAL
  Filled 2015-12-30 (×18): qty 1

## 2015-12-30 MED ORDER — FUROSEMIDE 10 MG/ML IJ SOLN
10.0000 mg | Freq: Once | INTRAMUSCULAR | Status: AC
  Administered 2015-12-30: 10 mg via INTRAVENOUS
  Filled 2015-12-30: qty 1

## 2015-12-30 MED ORDER — HYDROXYCHLOROQUINE SULFATE 200 MG PO TABS
200.0000 mg | ORAL_TABLET | Freq: Three times a day (TID) | ORAL | Status: DC
  Administered 2015-12-30 – 2016-01-07 (×25): 200 mg via ORAL
  Filled 2015-12-30 (×29): qty 1

## 2015-12-30 MED ORDER — MORPHINE SULFATE (PF) 4 MG/ML IV SOLN
2.0000 mg | Freq: Once | INTRAVENOUS | Status: AC
  Administered 2015-12-30: 2 mg via INTRAVENOUS
  Filled 2015-12-30: qty 1

## 2015-12-30 MED ORDER — FUROSEMIDE 10 MG/ML IJ SOLN
10.0000 mg | Freq: Once | INTRAMUSCULAR | Status: AC
  Administered 2015-12-30: 10 mg via INTRAVENOUS

## 2015-12-30 MED ORDER — IPRATROPIUM BROMIDE 0.02 % IN SOLN
0.5000 mg | Freq: Four times a day (QID) | RESPIRATORY_TRACT | Status: DC
  Administered 2015-12-30: 0.5 mg via RESPIRATORY_TRACT
  Filled 2015-12-30 (×6): qty 2.5

## 2015-12-30 MED ORDER — IOPAMIDOL (ISOVUE-370) INJECTION 76%
100.0000 mL | Freq: Once | INTRAVENOUS | Status: AC | PRN
  Administered 2015-12-30: 100 mL via INTRAVENOUS

## 2015-12-30 MED ORDER — IPRATROPIUM BROMIDE 0.02 % IN SOLN
0.5000 mg | Freq: Four times a day (QID) | RESPIRATORY_TRACT | Status: DC
  Administered 2015-12-31 – 2016-01-05 (×21): 0.5 mg via RESPIRATORY_TRACT
  Filled 2015-12-30 (×26): qty 2.5

## 2015-12-30 MED ORDER — BUDESONIDE 0.5 MG/2ML IN SUSP
0.5000 mg | Freq: Two times a day (BID) | RESPIRATORY_TRACT | Status: DC
  Administered 2015-12-30 – 2016-01-07 (×17): 0.5 mg via RESPIRATORY_TRACT
  Filled 2015-12-30 (×19): qty 2

## 2015-12-30 NOTE — Progress Notes (Signed)
Bed Coordinator contacted for bed assignment @ [place]. [facility] contacted. Will call report when bed assignment known. Pt is stable at this time. Toya Smothers, RN

## 2015-12-30 NOTE — Progress Notes (Signed)
Patient with continued SOB. Desats when oxygen no applied.  Vital Signs reviewed.  General Alert and oriented mild distress.  Lungs: Rales bilaterally  CV RRR Abdomen Soft nontender  Ext trace edema.   A/P Pulmonary edema/ Pneumonia/  Recommend transfer to pulmonary critical care service to manage primarily . Spoke with Dr. Lonn Georgia DIos who will accept the tranfer  To [place] pulmonary critical care service

## 2015-12-30 NOTE — Progress Notes (Signed)
  Echocardiogram 2D Echocardiogram has been performed.  Laura Rowland Androw 12/30/2015, 3:04 PM

## 2015-12-30 NOTE — Progress Notes (Signed)
Pt transferred to [facility] via [place]. Report called to Ashley Mariner, RN @ [facility]. Toya Smothers, RN

## 2015-12-30 NOTE — Progress Notes (Signed)
Pt given 10mg  of Lasix. Pulmonologist at bedside to discuss plan of care. Toya Smothers, RN

## 2015-12-30 NOTE — Progress Notes (Signed)
   [facility] PCCM  Case discussed with Dr. Landry Mellow. Pt remains SOB although she is diurescing.  She is on a VM/ [place] at 6L and sats > 90%.  Of the O2, her o2 sats drop in the 80s.  Dr. Landry Mellow requesting transfer to [facility] for further management.  Will transfer pt to [facility] SDU.  Will keep on PCCM service.  Depending on how she looks, if better tonight, we can transfer to [facility] as primary in am.  Pt may also decompensate tonight.  There are currently no ICU beds so will transfer pt to SDU.    Monica Becton, MD 12/30/2015, 3:02 PM [facility] Pager [phone] After 3 pm or if no answer, call [phone]

## 2015-12-30 NOTE — Progress Notes (Signed)
Pt with increased dyspnea and work of breathing throughout the night. Pt very anxious/tearful and placed on face mask at 6L. Neb treatment given by Respiratory with minima relief. MD notified and to bedside to evaluate. New orders received, CT angio completed and oncoming RN updated on patient status

## 2015-12-30 NOTE — Consult Note (Signed)
PULMONARY / CRITICAL CARE MEDICINE   Name: CELESTINA BLAKESLEY MRN: [id number] DOB: May 15, 1975    ADMISSION DATE:  12/28/2015 CONSULTATION DATE:  12/30/2015  REFERRING MD:  Dr. Landry Mellow  CHIEF COMPLAINT:  SOB  HISTORY OF PRESENT ILLNESS:   Patient is a 40 year old female, known to have scleroderma involving her lungs, for which she goes to [facility] (Dr. [name]), for the last 12 years. It was diagnosed during her first pregnancy. As far as the patient is concerned, scleroderma is affected her lungs with interstitial lung disease but this has been clinically stable the last 6 years. She only takes Plaquenel.  Patient is a nonsmoker. She has mild asthma which has been stable and controlled with albuterol inhaler.   She gave birth on November 29 via elective cesarean section because of her scleroderma. No complications during and immediately after her pregnancy. She got discharged at day after the cesarean section. Since discharge, she had worsening swelling of her legs, reaching to her thighs. She called her regular doctor and was advised by mouth medicines. She started being short of breath 2 days before admission which significantly worsened. On the day of admission, she remained short of breath with minimal exertion, not better with her albuterol. Because of her worsening symptoms, she ended up going to the emergency room.  She was treated as a preeclampsia on admission. Her blood pressure was elevated in the [id number] systolic which was new for patient. She got magnesium sulfate pushes and over 24 hours. The last 12 hours, she got more winded. She ended up getting Lasix and started to put out urine. By the time I saw her, she has made 7.6 L of urine and was -6.5 L. She said she is better compared to when she came in but still has some fluid and swelling in her legs. Still more SOB than baseline.   She denies fevers. Had some dry cough. No sick contacts.   PAST MEDICAL HISTORY :  She  has a past medical  history of Anemia and Scleredema ([place]).  Scleroderma has affected her lungs with interstitial lung disease as well as esophagus with esophageal dilatation. She has reflux disease related to this. There might be some mild pulmonary retention related to scleroderma.  She is not on oxygen.  PAST SURGICAL HISTORY: She  has a past surgical history that includes Cesarean section; Breast cyst excision (1996); Cyst removal hand (2008); Dilation and evacuation (N/A, 06/28/2014); Wisdom tooth extraction; and Cesarean section (N/A, 12/21/2015).  No Known Allergies  No current facility-administered medications on file prior to encounter.    Current Outpatient Prescriptions on File Prior to Encounter  Medication Sig  . Cholecalciferol (VITAMIN D) 2000 UNITS tablet Take 2,000 Units by mouth daily.  Marland Kitchen esomeprazole (NEXIUM) 40 MG capsule Take 30- 60 min before your first and last meals of the day (Patient taking differently: Take 40 mg by mouth 2 (two) times daily before a meal. Take 30- 60 min before your first and last meals of the day)  . hydroxychloroquine (PLAQUENIL) 200 MG tablet Take 200 mg by mouth 3 (three) times daily. Pt takes 2 tablets 400mg  in the morning and one tablet 200mg  at night  . ibuprofen (ADVIL,MOTRIN) 600 MG tablet Take 1 tablet (600 mg total) by mouth every 6 (six) hours.  . IRON PO Take 1 tablet by mouth daily.   Marland Kitchen oxyCODONE (OXY IR/ROXICODONE) 5 MG immediate release tablet Take 1 tablet (5 mg total) by mouth every 6 (six) hours  as needed (pain scale 4-7).  . Prenatal Vit w/Fe-Methylfol-FA (PNV PO) Take 1 tablet by mouth daily.   Marland Kitchen PROAIR HFA 108 (90 Base) MCG/ACT inhaler Inhale 2 puffs into the lungs every 4 (four) hours as needed for wheezing or shortness of breath.   . valACYclovir (VALTREX) 500 MG tablet Take 500 mg by mouth daily.    FAMILY HISTORY:  Her indicated that the status of her father is unknown.  Both parents are healthy. No history of scleroderma.  SOCIAL  HISTORY: She  reports that she has never smoked. She has never used smokeless tobacco. She reports that she does not drink alcohol or use drugs.  She is married. This is her second daughter. Her older daughter is 73 years old. Nonsmoker. She lives in [place]. She is on disability because of her scleroderma.  REVIEW OF SYSTEMS:   As above  SUBJECTIVE:  Short of breath but better the last 12 hours after diuresis. Denies chest pain, fevers, chills. Still with edema in her legs.  VITAL SIGNS: BP 138/68 (BP Location: Right Arm)   Pulse 66   Temp 98.2 F (36.8 C) (Oral)   Resp (!) 22   Ht 6' (1.829 m)   Wt 117.9 kg (260 lb)   SpO2 100%   BMI 35.26 kg/m   HEMODYNAMICS:    VENTILATOR SETTINGS:    INTAKE / OUTPUT: I/O last 3 completed shifts: In: [id number] [P.O.:3360; I.V.:630; IV Piggyback:300] Out: 10750 [Urine:10750]  PHYSICAL EXAMINATION: General:  Awake, comfortable, in mild distress. She gets winded after speaking long sentences. Neuro:  Cranial nerves grossly intact. No lateralizing signs. HEENT:  PERLA. Prominent NV. (-) oral thrush Cardiovascular:  Good s1/s2. (-) s3/m/r/g Lungs:  Good ae. Crackles mid-bases. (-) wheeze/rhonchi. (-) accesory muscle use.  Abdomen:  (+) BS, soft, obese. (-) tenderness. (-) d/c per wound Musculoskeletal:  Gr 1 edema. (-) clubbing/cyanosis Skin:  Warm and dry.   LABS:  BMET  Recent Labs Lab 12/28/15 1936 12/30/15 0634  NA 140 137  K 3.8 3.7  CL 109 105  CO2 23 22  BUN 14 10  CREATININE 0.79 0.73  GLUCOSE 84 100*    Electrolytes  Recent Labs Lab 12/28/15 1936 12/30/15 0634  CALCIUM 8.8* 7.4*    CBC  Recent Labs Lab 12/28/15 1936 12/30/15 0634  WBC 6.0 6.8  HGB 9.8* 9.6*  HCT 30.3* 29.7*  PLT 209 198    Coag's No results for input(s): APTT, INR in the last 168 hours.  Sepsis Markers No results for input(s): LATICACIDVEN, PROCALCITON, O2SATVEN in the last 168 hours.  ABG No results for input(s): PHART,  PCO2ART, PO2ART in the last 168 hours.  Liver Enzymes  Recent Labs Lab 12/28/15 1936 12/30/15 0634  AST 40 25  ALT 31 24  ALKPHOS 125 110  BILITOT 0.6 0.6  ALBUMIN 3.1* 3.1*    Cardiac Enzymes No results for input(s): TROPONINI, PROBNP in the last 168 hours.  Glucose No results for input(s): GLUCAP in the last 168 hours.  Imaging Ct Angio Chest Pe W Or Wo Contrast  Result Date: 12/30/2015 CLINICAL DATA:  Shortness of breath. EXAM: CT ANGIOGRAPHY CHEST WITH CONTRAST TECHNIQUE: Multidetector CT imaging of the chest was performed using the standard protocol during bolus administration of intravenous contrast. Multiplanar CT image reconstructions and MIPs were obtained to evaluate the vascular anatomy. CONTRAST:  100 mL of Isovue 370 intravenously. COMPARISON:  CT scan of July 08, 2013. FINDINGS: Cardiovascular: There is no evidence of thoracic  aortic dissection or aneurysm. There is no definite evidence of pulmonary embolus. Mediastinum/Nodes: No definite adenopathy is noted. Lungs/Pleura: Mild bilateral pleural effusions are noted, with left larger than right. Honeycombing of the lung bases is again noted as well as the upper lobes bilaterally, consistent with history of interstitial lung disease. However, there is interval development of patchy airspace opacity seen in the upper and lower lobes bilaterally, concerning for multifocal pneumonia. Upper Abdomen: No acute abnormality. Musculoskeletal: No chest wall abnormality. No acute or significant osseous findings. Review of the MIP images confirms the above findings. IMPRESSION: No definite evidence of pulmonary embolus. Mild bilateral pleural effusions are noted, left larger than right. Honeycombing of lung parenchyma is seen in the lung bases and upper lobes bilaterally, consistent with history of interstitial lung disease. There is interval development patchy airspace opacities in the upper and lower lobes bilaterally, concerning for  multifocal pneumonia. Electronically Signed   By: Marijo Conception, M.D.   On: 12/30/2015 07:47   Dg Chest Port 1 View  Result Date: 12/30/2015 CLINICAL DATA:  Dyspnea. EXAM: PORTABLE CHEST 1 VIEW COMPARISON:  12/28/2015 FINDINGS: Worsening left lower lobe opacity and developing right perihilar opacity may represent multifocal pneumonia superimposed on the severe fibrosis. No other significant wall change. No large effusions IMPRESSION: Developing focal airspace consolidation, likely multifocal pneumonia. Electronically Signed   By: Andreas Newport M.D.   On: 12/30/2015 03:18     STUDIES:  Chest CTA 12/ 8 > ILD, B effusion, patchy B infiltrates Echo 12/8 >>   CULTURES: Blood 12/ 8 > Sputum 12/ 8 >  MRSA 12/8 >   ANTIBIOTICS: Rocephin 12/7 > Azithromycin 12/7 >   SIGNIFICANT EVENTS: 11/29 > Elective CS 12/6 > admit for SOB.  Treated as preeclampsia  LINES/TUBES:   DISCUSSION: 11 female, status post cesarean section on November 29 electively because of her history of scleroderma, comes in with worsening dyspnea and edema since delivery. She was diagnosed with preeclampsia. Chest CTA shows baseline interstitial lung disease as well as bilateral effusion and patchy groundglass infiltrates.  Her scleroderma has affected her lungs with interstitial lung disease. No other complications with scleroderma other than esophageal dilatation.  Clinically, she presents as pulmonary edema related to preeclampsia. No symptoms suggestive of infection. With a chest CTA, which cannot rule out infection altogether. She has improved with diuresis but still has fluid on board.   ASSESSMENT  1. Acute hypoxemic respiratory failure secondary to acute pulmonary edema related to preeclampsia, likely diastolic heart failure. Differential will be community-acquired pneumonia. Less likely however as she clinically improved with diuresis.  2. Interstitial lung disease with scleroderma. 3. Scleroderma  affecting her lungs as well as esophagus. Possibly with pulmonary hypertension, mild. 4. Asthma, mild intermittent, not in exacerbation.   Plan : 1. The patient is already -6 L since admission. She still  has fluid on board. I will give her a total of 40 mg IV Lasix now and 40 mg IV Lasix this afternoon. Check electrolytes this noon  and see if we need to replace K. Suggest PO replacement if needed. Depending on how she is over the weekend, we can probably just cut down her IV Lasix to 20-40 mg IV daily. Observe creatinine as diuresis and the contrast may make it elevated. 2. Continue Rocephin for now. I will switch her to by mouth azithromycin. If she clinically worsens over the weekend despite diuresis, suggest to broaden her antibiotics to Zosyn and vancomycin. 3. Follow-up blood culture.  Awaiting sputum sample. Will check MRSA. 4. Check PCT. 5. Check 2decho.  6. DVT prophylaxis.  7. Limit fluid intake 8. Pulmicort neb 0.5 mg BID and atrovent qid.  9. Rx GERD 10. Asp precaution. 11. If she is worse over the weekend, pls call PCCM.  We will follow peripherally over the weekend.    - Updates: Plan discussed with the patient.   Monica Becton, MD Pulmonary and [place] Pager: [phone] After 3 pm or if no response, call [phone]  12/30/2015, 9:15 AM

## 2015-12-30 NOTE — Progress Notes (Signed)
Subjective: Postpartum Day 9: Cesarean Delivery Patient reports continued difficulty breathing. She diuresed 2500 cc after lasix this am.    Objective: Vital signs in last 24 hours: Temp:  [97.8 F (36.6 C)-98.6 F (37 C)] 98.2 F (36.8 C) (12/08 0730) Pulse Rate:  [73-99] 82 (12/08 0730) Resp:  [16-24] 22 (12/08 0730) BP: (99-146)/(55-87) 146/78 (12/08 0730) SpO2:  [98 %-100 %] 100 % (12/08 0730)  Physical Exam:  General: alert, cooperative and mild distress  CV RRR L:ungs Rales Bilaterally at bases  Lochia: appropriate Uterine Fundus: firm Incision: healing well DVT Evaluation: No evidence of DVT seen on physical exam.   Recent Labs  12/28/15 1936 12/30/15 0634  HGB 9.8* 9.6*  HCT 30.3* 29.7*   Spiral CT : no evidence of Pulmonary Embolism...  Multifocal pneumonia suspected.   Assessment/Plan: Status post Cesarean section. POD #9 s/p magnesium for preeclampsia and on abx for presumed pnemonia  Pt has rales with labored breathing pt responded to lasix this am given continued Rales plan to give 10 mg Lasix iv now.  This may be the result of cardiomyopathy instead of preeclampsia. Echocardiogram may be necessary. Will await [place] recommendations.  [facility] consult.  Dr  Deterding will see the pt  Monitor very closely Continue current care.  Leonore Frankson J. 12/30/2015, 8:12 AM Pager [phone]

## 2015-12-30 NOTE — Progress Notes (Signed)
Patient reporting increased dyspnea. Simple mask remain in place @6L . Pt has received breathing treatment from Respiratory. Dr. Landry Mellow contacted. Dr. Landry Mellow states she will be in to evaluate patient. Toya Smothers, RN

## 2015-12-30 NOTE — Progress Notes (Signed)
Subjective: Postpartum Day 9: Cesarean Delivery Patient reports tolerating PO, + flatus, + BM and no problems voiding.    Objective: Vital signs in last 24 hours: Temp:  [97.8 F (36.6 C)-98.4 F (36.9 C)] 98 F (36.7 C) (12/08 0017) Pulse Rate:  [71-99] 93 (12/08 0209) Resp:  [16-24] 24 (12/08 0209) BP: (99-133)/(55-83) 116/68 (12/08 0209) SpO2:  [97 %-100 %] 100 % (12/08 0209)  Physical Exam:  General: alert CV RRR Lungs rales B Lochia: appropriate Uterine Fundus: firm Incision: healing well DVT Evaluation: No evidence of DVT seen on physical exam.   Recent Labs  12/28/15 1936  HGB 9.8*  HCT 30.3*    Assessment/Plan: Status post Cesarean section. POD #9 s/p magnesium for preeclampsia and on abx for presumed pnemonia Pt has rales with labored breathing.  Will give lasix, get spiral CT to R/O PE or cardiomyopathy Elink consult.  Dr  Jimmy Footman will see the pt and recommended 2 mg of morphine to help increase pulmonary blood flow and relieve some patient anxiety Check cbc and cmp Monitor very closely Continue current care.  [place] A 12/30/2015, 6:03 AM

## 2015-12-31 ENCOUNTER — Inpatient Hospital Stay (HOSPITAL_COMMUNITY): Payer: Medicare HMO

## 2015-12-31 DIAGNOSIS — J9601 Acute respiratory failure with hypoxia: Secondary | ICD-10-CM

## 2015-12-31 LAB — BRAIN NATRIURETIC PEPTIDE: B NATRIURETIC PEPTIDE 5: 387.3 pg/mL — AB (ref 0.0–100.0)

## 2015-12-31 MED ORDER — FUROSEMIDE 10 MG/ML IJ SOLN
20.0000 mg | Freq: Once | INTRAMUSCULAR | Status: AC
Start: 2015-12-31 — End: 2015-12-31
  Administered 2015-12-31: 20 mg via INTRAVENOUS
  Filled 2015-12-31: qty 2

## 2015-12-31 MED ORDER — FUROSEMIDE 10 MG/ML IJ SOLN
80.0000 mg | Freq: Three times a day (TID) | INTRAMUSCULAR | Status: DC
  Administered 2015-12-31 – 2016-01-03 (×9): 80 mg via INTRAVENOUS
  Filled 2015-12-31 (×9): qty 8

## 2015-12-31 NOTE — Progress Notes (Signed)
[facility] Progress Note Patient Name: Laura Rowland DOB: [birth year] MRN: [id number]   Date of Service  12/31/2015  HPI/Events of Note  Patient being diuresed.  Has not voided since 7pm.  Bladder scan shows greater than 700 cc in bladder.  eICU Interventions  Plan: Insert foley catheter while being diuresed.     Intervention Category Intermediate Interventions: Oliguria - evaluation and management  DETERDING,ELIZABETH 12/31/2015, 4:07 AM

## 2015-12-31 NOTE — Consult Note (Signed)
PULMONARY / CRITICAL CARE MEDICINE   Name: Laura Rowland MRN: [id number] DOB: December 12, 1975    ADMISSION DATE:  12/28/2015 CONSULTATION DATE:  12/30/2015  REFERRING MD:  Dr. Landry Mellow  CHIEF COMPLAINT:  SOB  brief Patient is a 40 year old female, known to have scleroderma involving her lungs, for which she goes to [facility] (Dr. [name]), for the last 12 years. It was diagnosed during her first pregnancy. As far as the patient is concerned, scleroderma is affected her lungs with interstitial lung disease but this has been clinically stable the last 6 years. She only takes Plaquenel.  Patient is a nonsmoker. She has mild asthma which has been stable and controlled with albuterol inhaler.   She gave birth on November 29 via elective cesarean section because of her scleroderma. No complications during and immediately after her pregnancy. She got discharged at day after the cesarean section. Since discharge, she had worsening swelling of her legs, reaching to her thighs. She called her regular doctor and was advised by mouth medicines. She started being short of breath 2 days before admission which significantly worsened. On the day of admission, she remained short of breath with minimal exertion, not better with her albuterol. Because of her worsening symptoms, she ended up going to the emergency room.  She was treated as a preeclampsia on admission. Her blood pressure was elevated in the 132G systolic which was new for patient. She got magnesium sulfate pushes and over 24 hours. The last 12 hours, she got more winded. She ended up getting Lasix and started to put out urine. By the time I saw her, she has made 7.6 L of urine and was -6.5 L. She said she is better compared to when she came in but still has some fluid and swelling in her legs. Still more SOB than baseline.   She denies fevers. Had some dry cough. No sick contacts.   STUDIES and events Chest CTA 12/ 8 > ILD, B effusion, patchy B  infiltrates Echo 12/8 >>   11/29 > Elective CS 12/6 > admit for SOB.  Treated as preeclampsia   EVENTS 12/8 =- Short of breath but better the last 12 hours after diuresis. Denies chest pain, fevers, chills. Still with edema in her legs.   SUBJECTIVE/OVERNIGHT/INTERVAL HX 12/31/15 - says 50% bettter since admission. Lasix helped but still with class 4 dyspnea, face mask o2. Edema improved per RN and patient. RN says prn lasix ony. Patient says she no longer has ILD - but 2015 Ct does show basal honeucombing. Never been on [place] meds ., Was on cellcept many  Years ago  VITAL SIGNS: BP (!) 142/75   Pulse 87   Temp 98.6 F (37 C) (Oral)   Resp (!) 36   Ht 6' (1.829 m)   Wt 128.1 kg (282 lb 6.6 oz)   SpO2 98%   BMI 38.30 kg/m   HEMODYNAMICS:    VENTILATOR SETTINGS:    INTAKE / OUTPUT: I/O last 3 completed shifts: In: 69 [P.O.:720; IV Piggyback:100] Out: 9620 [Urine:9620]  PHYSICAL EXAMINATION: General:  Awake, comfortable, in mild distress. She gets winded after speaking long sentences. Neuro:  Cranial nerves grossly intact. No lateralizing signs. HEENT:  PERLA. Prominent NV. (-) oral thrush Cardiovascular:  Good s1/s2. (-) s3/m/r/g Lungs:  Good ae. Crackles mid-bases. (-) wheeze/rhonchi. (-) accesory muscle use.  Abdomen:  (+) BS, soft, obese. (-) tenderness. (-) d/c per wound Musculoskeletal:  Gr 1 edema. (-) clubbing/cyanosis Skin:  Warm and  dry.   LABS:  BMET  Recent Labs Lab 12/28/15 1936 12/30/15 0634 12/30/15 1028  NA 140 137 139  K 3.8 3.7 3.6  CL 109 105 106  CO2 _0 BUN _1 CREATININE 0.79 0.73 0.79  GLUCOSE 84 100* 93    Electrolytes  Recent Labs Lab 12/28/15 1936 12/30/15 0634 12/30/15 1028  CALCIUM 8.8* 7.4* 7.5*  MG  --   --  2.7*  PHOS  --   --  5.1*    CBC  Recent Labs Lab 12/28/15 1936 12/30/15 0634  WBC 6.0 6.8  HGB 9.8* 9.6*  HCT 30.3* 29.7*  PLT 209 198    Coag's No results for input(s): APTT, INR in  the last 168 hours.  Sepsis Markers  Recent Labs Lab 12/30/15 1028  PROCALCITON <0.10    ABG No results for input(s): PHART, PCO2ART, PO2ART in the last 168 hours.  Liver Enzymes  Recent Labs Lab 12/28/15 1936 12/30/15 0634 12/30/15 1028  AST 40 25 23  ALT _2 ALKPHOS 125 110 107  BILITOT 0.6 0.6 0.4  ALBUMIN 3.1* 3.1* 3.0*    Cardiac Enzymes No results for input(s): TROPONINI, PROBNP in the last 168 hours.  Glucose No results for input(s): GLUCAP in the last 168 hours.  Imaging Dg Chest Port 1 View  Result Date: 12/31/2015 CLINICAL DATA:  Dyspnea. EXAM: PORTABLE CHEST 1 VIEW COMPARISON:  12/30/2015 and chest CT 12/30/2015 FINDINGS: Lungs are somewhat hypoinflated with mild interval worsening of bilateral multifocal airspace opacification likely multifocal pneumonia. Mild stable cardiomegaly. Remainder of the exam is unchanged. IMPRESSION: Interval worsening of bilateral multifocal airspace process likely multifocal pneumonia. Stable cardiomegaly. Electronically Signed   By: Marin Olp M.D.   On: 12/31/2015 07:36    LINES/TUBES:   DISCUSSION: 21 female, status post cesarean section on November 29 electively because of her history of scleroderma, comes in with worsening dyspnea and edema since delivery. She was diagnosed with preeclampsia. Chest CTA shows baseline interstitial lung disease as well as bilateral effusion and patchy groundglass infiltrates.  Her scleroderma has affected her lungs with interstitial lung disease. No other complications with scleroderma other than esophageal dilatation.  Clinically, she presents as pulmonary edema related to preeclampsia. No symptoms suggestive of infection. With a chest CTA, which cannot rule out infection altogether. She has improved with diuresis but still has fluid on board.   ASSESSMENT  # Acute  Acute : hypoxemic respiratory failure secondary to acute pulmonary edema related to preeclampsia, likely  diastolic heart failure. Differential will be community-acquired pneumonia. Less likely however as she clinically improved with diuresis.   #Chronic 2. Interstitial lung disease with scleroderma. 3. Scleroderma affecting her lungs as well as esophagus. Possibly with pulmonary hypertension, mild. 4. Asthma, mild intermittent, not in exacerbation.    12/9 - subjectively better but stil l on FM o2 and class 4 dsypnea. Diuresis helping CT also suggests possible ALI. ECHO with preserved EF; unable to estimate PASP. Currently 10L negaitive but sill volume overloaded  Plan : 1. Start aggressive lasix 2. Dc antibiotics   3. DVT prophylaxis.  4 Pulmicort neb 0.5 mg BID and atrovent qid.  5 Rx GERD 6. Asp precaution. 7. Check ESR and If hypoxemia not better in  Few to several days, then consider empiric steroids   - Updates: Plan discussed with the patient. Transfer to SDU. PCCM to round. [facility] OB primary is roundiung    Dr. Brand Males,  M.D., F.C.C.P Pulmonary and Critical Care Medicine Staff Physician [place] [facility] Pager: [phone], If no answer or between  15:00h - 7:00h: call [phone]  12/31/2015 12:36 PM

## 2016-01-01 ENCOUNTER — Inpatient Hospital Stay (HOSPITAL_COMMUNITY): Payer: Medicare HMO

## 2016-01-01 DIAGNOSIS — J9601 Acute respiratory failure with hypoxia: Secondary | ICD-10-CM

## 2016-01-01 LAB — BASIC METABOLIC PANEL
ANION GAP: 8 (ref 5–15)
BUN: 9 mg/dL (ref 6–20)
CHLORIDE: 98 mmol/L — AB (ref 101–111)
CO2: 32 mmol/L (ref 22–32)
Calcium: 8.3 mg/dL — ABNORMAL LOW (ref 8.9–10.3)
Creatinine, Ser: 0.87 mg/dL (ref 0.44–1.00)
GFR calc Af Amer: >60 mL/min (ref >60)
GLUCOSE: 103 mg/dL — AB (ref 65–99)
POTASSIUM: 3.4 mmol/L — AB (ref 3.5–5.1)
Sodium: 138 mmol/L (ref 135–145)

## 2016-01-01 LAB — CBC WITH DIFFERENTIAL/PLATELET
BASOS PCT: 0 %
Basophils Absolute: 0 10*3/uL (ref 0.0–0.1)
EOS ABS: 0.2 10*3/uL (ref 0.0–0.7)
Eosinophils Relative: 3 %
HCT: 30.3 % — ABNORMAL LOW (ref 36.0–46.0)
HEMOGLOBIN: 9.6 g/dL — AB (ref 12.0–15.0)
LYMPHS ABS: 1.3 10*3/uL (ref 0.7–4.0)
Lymphocytes Relative: 20 %
MCH: 23.5 pg — ABNORMAL LOW (ref 26.0–34.0)
MCHC: 31.7 g/dL (ref 30.0–36.0)
MCV: 74.3 fL — ABNORMAL LOW (ref 78.0–100.0)
Monocytes Absolute: 0.5 10*3/uL (ref 0.1–1.0)
Monocytes Relative: 8 %
Neutro Abs: 4.4 10*3/uL (ref 1.7–7.7)
Neutrophils Relative %: 69 %
Platelets: 224 10*3/uL (ref 150–400)
RBC: 4.08 MIL/uL (ref 3.87–5.11)
RDW: 15.9 % — ABNORMAL HIGH (ref 11.5–15.5)
WBC: 6.5 10*3/uL (ref 4.0–10.5)

## 2016-01-01 LAB — PROCALCITONIN

## 2016-01-01 LAB — CULTURE, RESPIRATORY W GRAM STAIN: Special Requests: NORMAL

## 2016-01-01 LAB — SEDIMENTATION RATE: Sed Rate: 82 mm/hr — ABNORMAL HIGH (ref 0–22)

## 2016-01-01 LAB — MAGNESIUM: Magnesium: 1.9 mg/dL (ref 1.7–2.4)

## 2016-01-01 LAB — PHOSPHORUS: PHOSPHORUS: 5.6 mg/dL — AB (ref 2.5–4.6)

## 2016-01-01 MED ORDER — MAGNESIUM SULFATE 2 GM/50ML IV SOLN
2.0000 g | Freq: Once | INTRAVENOUS | Status: AC
  Administered 2016-01-01: 2 g via INTRAVENOUS
  Filled 2016-01-01: qty 50

## 2016-01-01 MED ORDER — POTASSIUM CHLORIDE CRYS ER 20 MEQ PO TBCR
40.0000 meq | EXTENDED_RELEASE_TABLET | Freq: Once | ORAL | Status: AC
  Administered 2016-01-01: 40 meq via ORAL
  Filled 2016-01-01: qty 2

## 2016-01-01 NOTE — Consult Note (Signed)
PULMONARY / CRITICAL CARE MEDICINE   Name: Laura Rowland MRN: [id number] DOB: 11/02/1975    ADMISSION DATE:  12/28/2015 CONSULTATION DATE:  12/30/2015  REFERRING MD:  Dr. Cole  CHIEF COMPLAINT:  SOB  brief Patient is a 40-year-old female, known to have scleroderma involving her lungs, for which she goes to [facility] (Dr. Joseph Shanahan), for the last 12 years. It was diagnosed during her first pregnancy. As far as the patient is concerned, scleroderma is affected her lungs with interstitial lung disease but this has been clinically stable the last 6 years. She only takes Plaquenel.  Patient is a nonsmoker. She has mild asthma which has been stable and controlled with albuterol inhaler.   She gave birth on November 29 via elective cesarean section because of her scleroderma. No complications during and immediately after her pregnancy. She got discharged at day after the cesarean section. Since discharge, she had worsening swelling of her legs, reaching to her thighs. She called her regular doctor and was advised by mouth medicines. She started being short of breath 2 days before admission which significantly worsened. On the day of admission, she remained short of breath with minimal exertion, not better with her albuterol. Because of her worsening symptoms, she ended up going to the emergency room.  She was treated as a preeclampsia on admission. Her blood pressure was elevated in the 180s systolic which was new for patient. She got magnesium sulfate pushes and over 24 hours. The last 12 hours, she got more winded. She ended up getting Lasix and started to put out urine. By the time I saw her, she has made 7.6 L of urine and was -6.5 L. She said she is better compared to when she came in but still has some fluid and swelling in her legs. Still more SOB than baseline.   She denies fevers. Had some dry cough. No sick contacts.   STUDIES and events Chest CTA 12/ 8 > ILD, B effusion, patchy B  infiltrates Echo 12/8 >> ef 55%. PASP could not be estiamted  11/29 > Elective CS 12/6 > admit for SOB.  Treated as preeclampsia   EVENTS 12/8 =- Short of breath but better the last 12 hours after diuresis. Denies chest pain, fevers, chills. Still with edema in her legs.  12/31/15 - says 50% bettter since admission. Lasix helped but still with class 4 dyspnea, face mask o2. Edema improved per RN and patient. RN says prn lasix ony. Patient says she no longer has ILD - but 2015 Ct does show basal honeucombing. Never been on PAH meds ., Was on cellcept many  Years ago   SUBJECTIVE/OVERNIGHT/INTERVAL HX 12/1015 - 30# lighter with lasix. On 2L [place] at rest - 95% but gets very tachyepneic and desaturates with transfers - RN concerned about anxiety element., -13L negtaive. Per patient - OB service is not primary. ESR 82  VITAL SIGNS: BP 137/89 (BP Location: Left Arm)   Pulse (!) 105   Temp 98.9 F (37.2 C) (Oral)   Resp (!) 32   Ht 6' (1.829 m)   Wt 113.9 kg (251 lb 1.7 oz)   SpO2 98%   BMI 34.06 kg/m   HEMODYNAMICS:    VENTILATOR SETTINGS:    INTAKE / OUTPUT: I/O last 3 completed shifts: In: 4210 [P.O.:1660; Other:2450; IV Piggyback:100] Out: 5600 [Urine:5600]  PHYSICAL EXAMINATION: General:  Awake, comfortable, in mild distress. She gets winded after speaking long sentences. Neuro:  Cranial nerves grossly intact. No lateralizing   signs. HEENT:  PERLA. Prominent NV. (-) oral thrush Cardiovascular:  Good s1/s2. (-) s3/m/r/g Lungs:  Good ae. Crackles mid-bases. (-) wheeze/rhonchi. (-) accesory muscle use. TACHYPNEIC Abdomen:  (+) BS, soft, obese. (-) tenderness. (-) d/c per wound Musculoskeletal:  Gr 1 edema. (-) clubbing/cyanosis Skin:  Warm and dry.   LABS:  PULMONARY No results for input(s): PHART, PCO2ART, PO2ART, HCO3, TCO2, O2SAT in the last 168 hours.  Invalid input(s): PCO2, PO2  CBC  Recent Labs Lab 12/28/15 1936 12/30/15 0634 01/01/16 0442  HGB 9.8* 9.6*  9.6*  HCT 30.3* 29.7* 30.3*  WBC 6.0 6.8 6.5  PLT 209 198 224    COAGULATION No results for input(s): INR in the last 168 hours.  CARDIAC  No results for input(s): TROPONINI in the last 168 hours. No results for input(s): PROBNP in the last 168 hours.   CHEMISTRY  Recent Labs Lab 12/28/15 1936 12/30/15 0634 12/30/15 1028 01/01/16 0442  NA 140 137 139 138  K 3.8 3.7 3.6 3.4*  CL 109 105 106 98*  CO2 _0 32  GLUCOSE 84 100* 93 103*  BUN _1 CREATININE 0.79 0.73 0.79 0.87  CALCIUM 8.8* 7.4* 7.5* 8.3*  MG  --   --  2.7* 1.9  PHOS  --   --  5.1* 5.6*   Estimated Creatinine Clearance: 123.7 mL/min (by C-G formula based on SCr of 0.87 mg/dL).   LIVER  Recent Labs Lab 12/28/15 1936 12/30/15 0634 12/30/15 1028  AST 40 25 23  ALT _2 ALKPHOS 125 110 107  BILITOT 0.6 0.6 0.4  PROT 6.9 7.3 7.0  ALBUMIN 3.1* 3.1* 3.0*     INFECTIOUS  Recent Labs Lab 12/30/15 1028 01/01/16 0442  PROCALCITON <0.10 <0.10     ENDOCRINE CBG (last 3)  No results for input(s): GLUCAP in the last 72 hours.       IMAGING x48h  - image(s) personally visualized  -   highlighted in bold Dg Chest Port 1 View  Result Date: 12/31/2015 CLINICAL DATA:  Dyspnea. EXAM: PORTABLE CHEST 1 VIEW COMPARISON:  12/30/2015 and chest CT 12/30/2015 FINDINGS: Lungs are somewhat hypoinflated with mild interval worsening of bilateral multifocal airspace opacification likely multifocal pneumonia. Mild stable cardiomegaly. Remainder of the exam is unchanged. IMPRESSION: Interval worsening of bilateral multifocal airspace process likely multifocal pneumonia. Stable cardiomegaly. Electronically Signed   By: Marin Olp M.D.   On: 12/31/2015 07:36       DISCUSSION: 89 female, status post cesarean section on November 29 electively because of her history of scleroderma, comes in with worsening dyspnea and edema since delivery. She was diagnosed with preeclampsia. Chest CTA shows  baseline interstitial lung disease as well as bilateral effusion and patchy groundglass infiltrates.  Her scleroderma has affected her lungs with interstitial lung disease. No other complications with scleroderma other than esophageal dilatation.  Clinically, she presents as pulmonary edema related to preeclampsia. No symptoms suggestive of infection. With a chest CTA, which cannot rule out infection altogether. She has improved with diuresis but still has fluid on board.   ASSESSMENT   #Chronic   Interstitial lung disease with scleroderma.  Scleroderma affecting her lungs as well as esophagus. Possibly with pulmonary hypertension, mild.  Asthma, mild intermittent, not in exacerbation.      # Acute  Acute : hypoxemic respiratory failure secondary to acute pulmonary edema related to preeclampsia, likely diastolic heart failure. Differential will be community-acquired pneumonia. Less likely (normal  PCT, no fever) and      12/9 - subjectively better but stil l on FM o2 and class 4 dsypnea. Diuresis helping CT also suggests possible ALI. ECHO with preserved EF; unable to estimate PASP. Currently 10L negaitive but sill volume overloaded     12/10 - clinically improved with diuresis - les o2 need but still with class 4 dyspnea - ? Dyspnea perception issue. ESR very high at 82 . -13L neg balance since admit with lasix. Holding bP   Plan : 1. Continue  aggressive lasix 2. Monitor off antibiotics   3. DVT prophylaxis.  4 Pulmicort neb 0.5 mg BID and atrovent qid.  5 Rx GERD 6. Asp precaution. 7.  If hypoxemia not better in  Few to several days, then consider empiric steroids (ESR 82 on 12/31/15) 8. fent x 1 for dyspnea 9. CXR 01/01/2016 and 01/02/16    - Updates: Plan discussed with the patient. Transfer to SDU 01/01/2016. PCCM to round as consult. [facility] primary from 01/02/16 and pccm off (per poatient ob services not involved). PCCM consult  Dr. Brand Males, M.D.,  [facility].C.P Pulmonary and Critical Care Medicine Staff Physician [place] [facility] Pager: [phone], If no answer or between  15:00h - 7:00h: call [phone]  01/01/2016 12:44 PM

## 2016-01-01 NOTE — Progress Notes (Signed)
[place] Progress Note Patient Name: Laura Rowland DOB: November 12, 1975 MRN: [id number]   Date of Service  01/01/2016  HPI/Events of Note  Hypokalemia  eICU Interventions  Potassium replaced     Intervention Category Minor Interventions: Electrolytes abnormality - evaluation and management  [name] 01/01/2016, 6:21 AM

## 2016-01-02 ENCOUNTER — Inpatient Hospital Stay (HOSPITAL_COMMUNITY): Payer: Medicare HMO

## 2016-01-02 DIAGNOSIS — R0902 Hypoxemia: Secondary | ICD-10-CM

## 2016-01-02 LAB — CBC WITH DIFFERENTIAL/PLATELET
BASOS ABS: 0 10*3/uL (ref 0.0–0.1)
Basophils Relative: 0 %
EOS ABS: 0.3 10*3/uL (ref 0.0–0.7)
EOS PCT: 4 %
HCT: 36.5 % (ref 36.0–46.0)
Hemoglobin: 11.6 g/dL — ABNORMAL LOW (ref 12.0–15.0)
LYMPHS ABS: 1.2 10*3/uL (ref 0.7–4.0)
Lymphocytes Relative: 15 %
MCH: 23.3 pg — AB (ref 26.0–34.0)
MCHC: 31.8 g/dL (ref 30.0–36.0)
MCV: 73.4 fL — AB (ref 78.0–100.0)
MONO ABS: 0.5 10*3/uL (ref 0.1–1.0)
Monocytes Relative: 7 %
Neutro Abs: 6 10*3/uL (ref 1.7–7.7)
Neutrophils Relative %: 74 %
Platelets: 305 10*3/uL (ref 150–400)
RBC: 4.97 MIL/uL (ref 3.87–5.11)
RDW: 15.8 % — AB (ref 11.5–15.5)
WBC: 8.1 10*3/uL (ref 4.0–10.5)

## 2016-01-02 LAB — MAGNESIUM: MAGNESIUM: 2.3 mg/dL (ref 1.7–2.4)

## 2016-01-02 LAB — BASIC METABOLIC PANEL
Anion gap: 13 (ref 5–15)
BUN: 12 mg/dL (ref 6–20)
CO2: 27 mmol/L (ref 22–32)
CREATININE: 0.91 mg/dL (ref 0.44–1.00)
Calcium: 9.2 mg/dL (ref 8.9–10.3)
Chloride: 97 mmol/L — ABNORMAL LOW (ref 101–111)
GFR calc Af Amer: >60 mL/min (ref >60)
GLUCOSE: 107 mg/dL — AB (ref 65–99)
Potassium: 3.9 mmol/L (ref 3.5–5.1)
SODIUM: 137 mmol/L (ref 135–145)

## 2016-01-02 LAB — PHOSPHORUS: Phosphorus: 4.9 mg/dL — ABNORMAL HIGH (ref 2.5–4.6)

## 2016-01-02 MED ORDER — BLISTEX MEDICATED EX OINT
1.0000 "application " | TOPICAL_OINTMENT | CUTANEOUS | Status: DC | PRN
  Filled 2016-01-02: qty 6.3

## 2016-01-02 MED ORDER — POLYETHYLENE GLYCOL 3350 17 G PO PACK
17.0000 g | PACK | Freq: Two times a day (BID) | ORAL | Status: DC
  Administered 2016-01-02 – 2016-01-07 (×10): 17 g via ORAL
  Filled 2016-01-02 (×11): qty 1

## 2016-01-02 MED ORDER — SALINE SPRAY 0.65 % NA SOLN
1.0000 | NASAL | Status: DC | PRN
  Filled 2016-01-02: qty 44

## 2016-01-02 MED ORDER — POTASSIUM CHLORIDE CRYS ER 20 MEQ PO TBCR
20.0000 meq | EXTENDED_RELEASE_TABLET | Freq: Once | ORAL | Status: AC
  Administered 2016-01-02: 20 meq via ORAL
  Filled 2016-01-02: qty 1

## 2016-01-02 MED ORDER — GUAIFENESIN-DM 100-10 MG/5ML PO SYRP
5.0000 mL | ORAL_SOLUTION | ORAL | Status: DC | PRN
  Administered 2016-01-02 – 2016-01-07 (×10): 5 mL via ORAL
  Filled 2016-01-02 (×10): qty 5

## 2016-01-02 MED ORDER — ONDANSETRON HCL 4 MG/2ML IJ SOLN
4.0000 mg | Freq: Four times a day (QID) | INTRAMUSCULAR | Status: DC | PRN
  Administered 2016-01-04: 4 mg via INTRAVENOUS
  Filled 2016-01-02: qty 2

## 2016-01-02 MED ORDER — ALPRAZOLAM 0.25 MG PO TABS
0.2500 mg | ORAL_TABLET | Freq: Two times a day (BID) | ORAL | Status: DC | PRN
  Administered 2016-01-02: 0.25 mg via ORAL
  Filled 2016-01-02: qty 1

## 2016-01-02 NOTE — Progress Notes (Signed)
RN paged to bedside; pt dry heaving over the bedside; states she started this after awakening w/a coughing spell.  Dr. Tyrell Antonio paged - new orders received for n/v and anxiety PRN.

## 2016-01-02 NOTE — Progress Notes (Signed)
PULMONARY / CRITICAL CARE MEDICINE   Name: Laura Rowland MRN: [id number] DOB: 1975-05-07    ADMISSION DATE:  12/28/2015 CONSULTATION DATE:  12/30/2015  REFERRING MD:  Dr. Landry Mellow  CHIEF COMPLAINT:  SOB  brief Patient is a 40 year old female, known to have scleroderma involving her lungs, for which she goes to [facility] (Dr. [name]), for the last 12 years. It was diagnosed during her first pregnancy. As far as the patient is concerned, scleroderma is affected her lungs with interstitial lung disease but this has been clinically stable the last 6 years. She only takes Plaquenel.  Patient is a nonsmoker. She has mild asthma which has been stable and controlled with albuterol inhaler.   She gave birth on November 29 via elective cesarean section because of her scleroderma. No complications during and immediately after her pregnancy. She got discharged at day after the cesarean section. Since discharge, she had worsening swelling of her legs, reaching to her thighs. She called her regular doctor and was advised by mouth medicines. She started being short of breath 2 days before admission which significantly worsened. On the day of admission, she remained short of breath with minimal exertion, not better with her albuterol. Because of her worsening symptoms, she ended up going to the emergency room.  She was treated as a preeclampsia on admission. Her blood pressure was elevated in the 347Q systolic which was new for patient. She got magnesium sulfate pushes and over 24 hours. The last 12 hours, she got more winded. She ended up getting Lasix and started to put out urine. By the time I saw her, she has made 7.6 L of urine and was -6.5 L. She said she is better compared to when she came in but still has some fluid and swelling in her legs. Still more SOB than baseline.   She denies fevers. Had some dry cough. No sick contacts.   STUDIES and events Chest CTA 12/ 8 > ILD, B effusion, patchy B  infiltrates Echo 12/8 >> ef 55%. PASP could not be estiamted  11/29 > Elective CS 12/6 > admit for SOB.  Treated as preeclampsia   EVENTS 12/8 =- Short of breath but better the last 12 hours after diuresis. Denies chest pain, fevers, chills. Still with edema in her legs.  12/31/15 - says 50% bettter since admission. Lasix helped but still with class 4 dyspnea, face mask o2. Edema improved per RN and patient. RN says prn lasix ony. Patient says she no longer has ILD - but 2015 Ct does show basal honeucombing. Never been on [place] meds ., Was on cellcept many  Years ago   SUBJECTIVE/OVERNIGHT/INTERVAL HX No events overnight, on the phone.  VITAL SIGNS: BP 135/83 (BP Location: Left Arm)   Pulse 90   Temp 97.8 F (36.6 C) (Oral)   Resp (!) 32   Ht 6' (1.829 m)   Wt 113.9 kg (251 lb 1.7 oz)   SpO2 97%   BMI 34.06 kg/m   HEMODYNAMICS:    VENTILATOR SETTINGS:    INTAKE / OUTPUT: I/O last 3 completed shifts: In: 2595 [P.O.:1080; Other:2450] Out: 6525 [Urine:6525]  PHYSICAL EXAMINATION: General:  Awake, comfortable, no respiratory distress Neuro:  Cranial nerves grossly intact. No lateralizing signs. HEENT:  PERLA. Prominent NV. (-) oral thrush Cardiovascular:  Good s1/s2. (-) s3/m/r/g Lungs:  Good ae. Crackles mid-bases. (-) wheeze/rhonchi. (-) accesory muscle use. TACHYPNEIC Abdomen:  (+) BS, soft, obese. (-) tenderness. (-) d/c per wound Musculoskeletal:  Gr  1 edema. (-) clubbing/cyanosis Skin:  Warm and dry.   LABS:  PULMONARY No results for input(s): PHART, PCO2ART, PO2ART, HCO3, TCO2, O2SAT in the last 168 hours.  Invalid input(s): PCO2, PO2  CBC  Recent Labs Lab 12/30/15 0634 01/01/16 0442 01/02/16 0408  HGB 9.6* 9.6* 11.6*  HCT 29.7* 30.3* 36.5  WBC 6.8 6.5 8.1  PLT 198 224 305    COAGULATION No results for input(s): INR in the last 168 hours.  CARDIAC  No results for input(s): TROPONINI in the last 168 hours. No results for input(s): PROBNP in  the last 168 hours.   CHEMISTRY  Recent Labs Lab 12/28/15 1936 12/30/15 0634 12/30/15 1028 01/01/16 0442 01/02/16 0408  NA 140 137 139 138 137  K 3.8 3.7 3.6 3.4* 3.9  CL 109 105 106 98* 97*  CO2 23 22 26  32 27  GLUCOSE 84 100* 93 103* 107*  BUN 14 10 9 9 12   CREATININE 0.79 0.73 0.79 0.87 0.91  CALCIUM 8.8* 7.4* 7.5* 8.3* 9.2  MG  --   --  2.7* 1.9 2.3  PHOS  --   --  5.1* 5.6* 4.9*   Estimated Creatinine Clearance: 118.3 mL/min (by C-G formula based on SCr of 0.91 mg/dL).   LIVER  Recent Labs Lab 12/28/15 1936 12/30/15 0634 12/30/15 1028  AST 40 25 23  ALT 31 24 22   ALKPHOS 125 110 107  BILITOT 0.6 0.6 0.4  PROT 6.9 7.3 7.0  ALBUMIN 3.1* 3.1* 3.0*     INFECTIOUS  Recent Labs Lab 12/30/15 1028 01/01/16 0442  PROCALCITON <0.10 <0.10     ENDOCRINE CBG (last 3)  No results for input(s): GLUCAP in the last 72 hours.       IMAGING x48h  - image(s) personally visualized  -   highlighted in bold Dg Chest Port 1 View  Result Date: 01/02/2016 CLINICAL DATA:  Acute respiratory failure. EXAM: PORTABLE CHEST 1 VIEW COMPARISON:  01/01/2016 and CT chest 12/30/2015. FINDINGS: Trachea is midline. Heart size stable. Lungs are low in volume with patchy bilateral airspace opacification, similar to yesterday's exam. No definite pleural fluid. Right hemidiaphragm is elevated. IMPRESSION: Patchy bilateral airspace opacification, unchanged from the prior exam, most indicative of pneumonia. Electronically Signed   By: Lorin Picket M.D.   On: 01/02/2016 07:09   Dg Chest Port 1 View  Result Date: 01/01/2016 CLINICAL DATA:  Acute respiratory failure. Cough and shortness of breath. EXAM: PORTABLE CHEST 1 VIEW COMPARISON:  December 9 1,017 FINDINGS: Interval worsening of the right-sided pulmonary infiltrate. Infiltrate in the left mid lower lung is stable. No other interval changes. IMPRESSION: Worsening multifocal infiltrate. Electronically Signed   By: Dorise Bullion  III M.D   On: 01/01/2016 14:26   I reviewed CXR myself, mild pulmonary edema noted.  DISCUSSION: 86 female, status post cesarean section on November 29 electively because of her history of scleroderma, comes in with worsening dyspnea and edema since delivery. She was diagnosed with preeclampsia. Chest CTA shows baseline interstitial lung disease as well as bilateral effusion and patchy groundglass infiltrates.  Her scleroderma has affected her lungs with interstitial lung disease. No other complications with scleroderma other than esophageal dilatation.  Clinically, she presents as pulmonary edema related to preeclampsia. No symptoms suggestive of infection. With a chest CTA, which cannot rule out infection altogether. She has improved with diuresis but still has fluid on board.   ASSESSMENT   #Chronic   Interstitial lung disease with scleroderma.  Scleroderma  affecting her lungs as well as esophagus. Possibly with pulmonary hypertension, mild.  Asthma, mild intermittent, not in exacerbation.   Acute : hypoxemic respiratory failure secondary to acute pulmonary edema related to preeclampsia and volume status, likely diastolic heart failure.  No evidence of infection  Acute pulmonary edema  Plan : 1. Continue diureses 2. Monitor off antibiotics, no need for abx at this time. 3. DVT prophylaxis.  4 Pulmicort neb 0.5 mg BID and atrovent qid.  5 Rx GERD 6. Asp precaution. 7.  If hypoxemia not better in a few to several days, then consider empiric steroids (ESR 82 on 12/31/15) 8. fent x 1 for dyspnea  - Discussed with PCCM-NP, PCCM will sign off, please call back if needed.  Rush Farmer, M.D. [facility] Pulmonary/Critical Care Medicine. Pager: [phone]. After hours pager: [phone].  01/02/2016 10:41 AM

## 2016-01-02 NOTE — Plan of Care (Signed)
Problem: ICU Phase Progression Outcomes Goal: O2 sats trending toward baseline Outcome: Not Progressing Pt sats dropped to mid 70s with exertion on 5L [place], required titration to 6L and 15 minutes for O2 sats to fully recover back to mid-90s on 5-6L [place].  Goal: Dyspnea controlled at rest Outcome: Progressing Pt remains very tachypneic, though respirations are even and relatively unlabored.  Goal: Pain controlled with appropriate interventions Outcome: Progressing Reports of pain successfully addressed with PRN pain meds per orders. Goal: Voiding-avoid urinary catheter unless indicated Outcome: Progressing Foley in place and IV lasix given per orders.

## 2016-01-02 NOTE — Progress Notes (Signed)
PROGRESS NOTE    Laura Rowland  JQD:[id number] DOB: [birth year] DOA: 12/28/2015 PCP: [name], MD   Brief Narrative: 40 year old with PMH significant for Scleroderma, involving lungs, patient S/P Delivery Nov 29, presents with Dyspnea, Acute hypoxic respiratory failure. Patient thought to have Pulmonary edema related to preeclampsia. Patient transfer to [facility] under CCM service. [facility] asked to take over care. Pulmonary will continue to follow as consultant.   Assessment & Plan:   Active Problems:   Preeclampsia in postpartum period   Acute pulmonary edema (HCC)   Acute respiratory failure with hypoxia (HCC)   1-Acute Hypoxic Respiratory Failure: Multifactorial, Pulmonary edema from preeclampsia, ILD from scleroderma,  CTA on admission:  Negative 17 L, urine out put Dec 10: 6.4 L.  Continue with IV lasix.  ESR elevated, defer to CCM starting steroids.  Titrate oxygen off as possible, might need home oxygen.  Will need to evaluate home oxygen needs at discharge.  PT consult.  ECHO; normal Ef and diastolic dysfunction.  Sputum culture; growing few streptococcus, patient remain afebrile, WBC normal, discussed with CCM would not start antibiotics at this time.   2-Scleroderma;  Continue with Plaquenil.   3-Constipation; start Miralax.   4-HTN; PRN hydralazine.  On lasix.    DVT prophylaxis: SCD.  Code Status: Full Code.  Family Communication: Care discussed with patient.  Disposition Plan: home when Respiratory failure improved. Nurse to assess need of foley.  Consultants:   CCM   Procedures:   ECHO; normal Ef, and normal Diastolic function.    Antimicrobials:   none   Subjective: She is feeling better, Breathing better.  Report mild abdominal pain. Cramping pain.  Bloody vaginal discharge decreasing.    Objective: Vitals:   01/02/16 0201 01/02/16 0300 01/02/16 0411 01/02/16 0726  BP:   139/79 135/83  Pulse:  94 98 90  Resp:  (!) 37 (!) 34 (!) 32    Temp:   98.6 F (37 C) 97.8 F (36.6 C)  TempSrc:   Oral Oral  SpO2: 92% 99% 91% 95%  Weight:      Height:        Intake/Output Summary (Last 24 hours) at 01/02/16 0811 Last data filed at 01/02/16 0645  Gross per 24 hour  Intake              480 ml  Output             6425 ml  Net            -5945 ml   Filed Weights   12/28/15 2213 12/30/15 1655 01/01/16 0300  Weight: 117.9 kg (260 lb) 128.1 kg (282 lb 6.6 oz) 113.9 kg (251 lb 1.7 oz)    Examination:  General exam: Appears calm and comfortable  Respiratory system: Clear to auscultation. Respiratory effort normal. Cardiovascular system: S1 & S2 heard, RRR. No JVD, murmurs, rubs, gallops or clicks. No pedal edema. Gastrointestinal system: Abdomen is nondistended, soft and nontender. No organomegaly or masses felt. Normal bowel sounds heard. Central nervous system: Alert and oriented. No focal neurological deficits. Extremities: Symmetric 5 x 5 power. Skin: No rashes, lesions or ulcers Psychiatry: Judgement and insight appear normal. Mood & affect appropriate.     Data Reviewed: I have personally reviewed following labs and imaging studies  CBC:  Recent Labs Lab 12/28/15 1936 12/30/15 0634 01/01/16 0442 01/02/16 0408  WBC 6.0 6.8 6.5 8.1  NEUTROABS  --  5.1 4.4 6.0  HGB 9.8* 9.6* 9.6*  11.6*  HCT 30.3* 29.7* 30.3* 36.5  MCV 73.9* 73.7* 74.3* 73.4*  PLT 209 198 224 830   Basic Metabolic Panel:  Recent Labs Lab 12/28/15 1936 12/30/15 0634 12/30/15 1028 01/01/16 0442 01/02/16 0408  NA 140 137 139 138 137  K 3.8 3.7 3.6 3.4* 3.9  CL 109 105 106 98* 97*  CO2 _0 32 27  GLUCOSE 84 100* 93 103* 107*  BUN _1 CREATININE 0.79 0.73 0.79 0.87 0.91  CALCIUM 8.8* 7.4* 7.5* 8.3* 9.2  MG  --   --  2.7* 1.9 2.3  PHOS  --   --  5.1* 5.6* 4.9*   GFR: Estimated Creatinine Clearance: 118.3 mL/min (by C-G formula based on SCr of 0.91 mg/dL). Liver Function Tests:  Recent Labs Lab 12/28/15 1936  12/30/15 0634 12/30/15 1028  AST 40 25 23  ALT _2 ALKPHOS 125 110 107  BILITOT 0.6 0.6 0.4  PROT 6.9 7.3 7.0  ALBUMIN 3.1* 3.1* 3.0*   No results for input(s): LIPASE, AMYLASE in the last 168 hours. No results for input(s): AMMONIA in the last 168 hours. Coagulation Profile: No results for input(s): INR, PROTIME in the last 168 hours. Cardiac Enzymes: No results for input(s): CKTOTAL, CKMB, CKMBINDEX, TROPONINI in the last 168 hours. BNP (last 3 results) No results for input(s): PROBNP in the last 8760 hours. HbA1C: No results for input(s): HGBA1C in the last 72 hours. CBG: No results for input(s): GLUCAP in the last 168 hours. Lipid Profile: No results for input(s): CHOL, HDL, LDLCALC, TRIG, CHOLHDL, LDLDIRECT in the last 72 hours. Thyroid Function Tests: No results for input(s): TSH, T4TOTAL, FREET4, T3FREE, THYROIDAB in the last 72 hours. Anemia Panel: No results for input(s): VITAMINB12, FOLATE, FERRITIN, TIBC, IRON, RETICCTPCT in the last 72 hours. Sepsis Labs:  Recent Labs Lab 12/30/15 1028 01/01/16 0442  PROCALCITON <0.10 <0.10    Recent Results (from the past 240 hour(s))  Culture, blood (routine x 2) Call MD if unable to obtain prior to antibiotics being given     Status: None (Preliminary result)   Collection Time: 12/29/15  1:17 AM  Result Value Ref Range Status   Specimen Description BLOOD RIGHT ARM  Final   Special Requests BOTTLES DRAWN AEROBIC AND ANAEROBIC 5CC  Final   Culture   Final    NO GROWTH 3 DAYS Performed at [facility]    Report Status PENDING  Incomplete  Culture, blood (routine x 2) Call MD if unable to obtain prior to antibiotics being given     Status: None (Preliminary result)   Collection Time: 12/29/15  1:21 AM  Result Value Ref Range Status   Specimen Description BLOOD RIGHT HAND  Final   Special Requests BOTTLES DRAWN AEROBIC AND ANAEROBIC 5CC  Final   Culture   Final    NO GROWTH 3 DAYS Performed at [facility]    Report Status PENDING  Incomplete  Culture, respiratory (NON-Expectorated)     Status: None   Collection Time: 12/29/15 10:20 PM  Result Value Ref Range Status   Specimen Description SPUTUM  Final   Special Requests Normal  Final   Gram Stain   Final    RARE WBC PRESENT, PREDOMINANTLY PMN FEW SQUAMOUS EPITHELIAL CELLS PRESENT NO ORGANISMS SEEN Performed at [facility]  Final   Report Status 01/01/2016 FINAL  Final  MRSA PCR Screening  Status: None   Collection Time: 12/30/15  9:57 AM  Result Value Ref Range Status   MRSA by PCR NEGATIVE NEGATIVE Final    Comment:        The GeneXpert MRSA Assay (FDA approved for NASAL specimens only), is one component of a comprehensive MRSA colonization surveillance program. It is not intended to diagnose MRSA infection nor to guide or monitor treatment for MRSA infections.          Radiology Studies: Dg Chest Port 1 View  Result Date: 01/02/2016 CLINICAL DATA:  Acute respiratory failure. EXAM: PORTABLE CHEST 1 VIEW COMPARISON:  01/01/2016 and CT chest 12/30/2015. FINDINGS: Trachea is midline. Heart size stable. Lungs are low in volume with patchy bilateral airspace opacification, similar to yesterday's exam. No definite pleural fluid. Right hemidiaphragm is elevated. IMPRESSION: Patchy bilateral airspace opacification, unchanged from the prior exam, most indicative of pneumonia. Electronically Signed   By: Lorin Picket M.D.   On: 01/02/2016 07:09   Dg Chest Port 1 View  Result Date: 01/01/2016 CLINICAL DATA:  Acute respiratory failure. Cough and shortness of breath. EXAM: PORTABLE CHEST 1 VIEW COMPARISON:  December 9 1,017 FINDINGS: Interval worsening of the right-sided pulmonary infiltrate. Infiltrate in the left mid lower lung is stable. No other interval changes. IMPRESSION: Worsening multifocal infiltrate. Electronically Signed   By: Dorise Bullion III M.D   On:  01/01/2016 14:26        Scheduled Meds: . budesonide (PULMICORT) nebulizer solution  0.5 mg Nebulization BID  . furosemide  80 mg Intravenous Q8H  . hydroxychloroquine  200 mg Oral TID  . ibuprofen  600 mg Oral Q6H  . ipratropium  0.5 mg Nebulization Q6H  . pantoprazole  40 mg Oral BID   Continuous Infusions:   LOS: 5 days    Time spent: 35 minutes.     Elmarie Shiley, MD [facility] Pager [phone]  If 7PM-7AM, please contact night-coverage [email or web address] Password [email or web address] 01/02/2016, 8:11 AM

## 2016-01-03 ENCOUNTER — Inpatient Hospital Stay (HOSPITAL_COMMUNITY): Payer: Medicare HMO

## 2016-01-03 DIAGNOSIS — R0602 Shortness of breath: Secondary | ICD-10-CM

## 2016-01-03 DIAGNOSIS — R0600 Dyspnea, unspecified: Secondary | ICD-10-CM

## 2016-01-03 LAB — CBC WITH DIFFERENTIAL/PLATELET
Basophils Absolute: 0 10*3/uL (ref 0.0–0.1)
Basophils Relative: 0 %
Eosinophils Absolute: 0.4 10*3/uL (ref 0.0–0.7)
Eosinophils Relative: 5 %
HCT: 37.9 % (ref 36.0–46.0)
Hemoglobin: 12 g/dL (ref 12.0–15.0)
LYMPHS ABS: 1.8 10*3/uL (ref 0.7–4.0)
LYMPHS PCT: 24 %
MCH: 23.4 pg — AB (ref 26.0–34.0)
MCHC: 31.7 g/dL (ref 30.0–36.0)
MCV: 73.9 fL — AB (ref 78.0–100.0)
Monocytes Absolute: 0.5 10*3/uL (ref 0.1–1.0)
Monocytes Relative: 6 %
Neutro Abs: 4.8 10*3/uL (ref 1.7–7.7)
Neutrophils Relative %: 65 %
Platelets: 316 10*3/uL (ref 150–400)
RBC: 5.13 MIL/uL — AB (ref 3.87–5.11)
RDW: 16.1 % — ABNORMAL HIGH (ref 11.5–15.5)
WBC: 7.4 10*3/uL (ref 4.0–10.5)

## 2016-01-03 LAB — CULTURE, BLOOD (ROUTINE X 2)
CULTURE: NO GROWTH
Culture: NO GROWTH

## 2016-01-03 LAB — BASIC METABOLIC PANEL
Anion gap: 12 (ref 5–15)
BUN: 16 mg/dL (ref 6–20)
CHLORIDE: 98 mmol/L — AB (ref 101–111)
CO2: 27 mmol/L (ref 22–32)
Calcium: 9.2 mg/dL (ref 8.9–10.3)
Creatinine, Ser: 0.97 mg/dL (ref 0.44–1.00)
GFR calc non Af Amer: >60 mL/min (ref >60)
Glucose, Bld: 108 mg/dL — ABNORMAL HIGH (ref 65–99)
POTASSIUM: 3.5 mmol/L (ref 3.5–5.1)
SODIUM: 137 mmol/L (ref 135–145)

## 2016-01-03 MED ORDER — POTASSIUM CHLORIDE CRYS ER 20 MEQ PO TBCR
40.0000 meq | EXTENDED_RELEASE_TABLET | Freq: Once | ORAL | Status: AC
  Administered 2016-01-03: 40 meq via ORAL
  Filled 2016-01-03: qty 2

## 2016-01-03 MED ORDER — GUAIFENESIN ER 600 MG PO TB12
600.0000 mg | ORAL_TABLET | Freq: Two times a day (BID) | ORAL | Status: DC
  Administered 2016-01-03 – 2016-01-07 (×9): 600 mg via ORAL
  Filled 2016-01-03 (×9): qty 1

## 2016-01-03 MED ORDER — ORAL CARE MOUTH RINSE
15.0000 mL | Freq: Two times a day (BID) | OROMUCOSAL | Status: DC
  Administered 2016-01-03 – 2016-01-07 (×7): 15 mL via OROMUCOSAL

## 2016-01-03 MED ORDER — FUROSEMIDE 10 MG/ML IJ SOLN
80.0000 mg | Freq: Two times a day (BID) | INTRAMUSCULAR | Status: DC
  Administered 2016-01-03 – 2016-01-04 (×2): 80 mg via INTRAVENOUS
  Filled 2016-01-03 (×2): qty 8

## 2016-01-03 MED ORDER — POTASSIUM CHLORIDE CRYS ER 20 MEQ PO TBCR
40.0000 meq | EXTENDED_RELEASE_TABLET | Freq: Two times a day (BID) | ORAL | Status: AC
  Administered 2016-01-03 (×2): 40 meq via ORAL
  Filled 2016-01-03 (×2): qty 2

## 2016-01-03 NOTE — Progress Notes (Signed)
PROGRESS NOTE    Laura Rowland  XLK:[id number] DOB: 1975-07-16 DOA: 12/28/2015 PCP: [name], MD   Brief Narrative: 40 year old with PMH significant for Scleroderma, involving lungs, patient S/P Delivery Nov 29, presents with Dyspnea, Acute hypoxic respiratory failure. Patient thought to have Pulmonary edema related to preeclampsia. Patient transfer to [facility] under CCM service. [facility] asked to take over care. Pulmonary will continue to follow as consultant.   Assessment & Plan:   Active Problems:   Preeclampsia in postpartum period   Acute pulmonary edema (HCC)   Acute respiratory failure with hypoxia (HCC)   1-Acute Hypoxic Respiratory Failure: Multifactorial, Pulmonary edema from preeclampsia, ILD from scleroderma,  CTA on admission:  Negative 17 L, urine out put Dec 11; 3 L.  Continue with IV lasix 80 mg TID. Renal function stable.   ESR elevated, defer to CCM starting steroids.  Titrate oxygen off as possible, might need home oxygen.  Will need to evaluate home oxygen needs at discharge.  PT consulted.  ECHO; normal Ef and diastolic dysfunction.  Sputum culture; growing few streptococcus, patient remain afebrile, WBC normal, discussed with CCM would not start antibiotics at this time.  Weight: 282--- 251--- 239----236. Prior Weight: 10-2012 : 243---weight 06-2014;; 250 Will change lasix to BID.  Will ask pulmonologist to follow on  patient.   2-Scleroderma;  Continue with Plaquenil.  Left message as request of patient  to Dr. [name] office, awaiting call backed.   3-Constipation; started Miralax.   4-HTN; PRN hydralazine.  On lasix.    DVT prophylaxis: SCD.  Code Status: Full Code.  Family Communication: Care discussed with patient.  Disposition Plan: home when Respiratory failure improved.   Consultants:   CCM   Procedures:   ECHO; normal Ef, and normal Diastolic function.    Antimicrobials:   none   Subjective: She is feeling better  than yesterday. Gets coughing spells, and gets anxious and de-sats.    Objective: Vitals:   01/03/16 0500 01/03/16 0736 01/03/16 0801 01/03/16 0803  BP: 120/74 125/72    Pulse: 98 100    Resp: (!) 31 (!) 31    Temp: 98.2 F (36.8 C) 98.1 F (36.7 C)    TempSrc: Oral Oral    SpO2: 95% 97% 98% 95%  Weight: 107.3 kg (236 lb 9.6 oz)     Height:        Intake/Output Summary (Last 24 hours) at 01/03/16 0853 Last data filed at 01/03/16 0539  Gross per 24 hour  Intake              480 ml  Output             3075 ml  Net            -2595 ml   Filed Weights   01/01/16 0300 01/02/16 1107 01/03/16 0500  Weight: 113.9 kg (251 lb 1.7 oz) 108.8 kg (239 lb 12.8 oz) 107.3 kg (236 lb 9.6 oz)    Examination:  General exam: Appears calm and comfortable  Respiratory system:  Respiratory effort normal. Crackles bases.  Cardiovascular system: S1 & S2 heard, RRR. No JVD, murmurs, rubs, gallops or clicks. No pedal edema. Gastrointestinal system: Abdomen is nondistended, soft and nontender. No organomegaly or masses felt. Normal bowel sounds heard. Central nervous system: Alert and oriented. No focal neurological deficits. Extremities: Symmetric 5 x 5 power. Skin: No rashes, lesions or ulcers Psychiatry: Judgement and insight appear normal. Mood & affect appropriate.  Data Reviewed: I have personally reviewed following labs and imaging studies  CBC:  Recent Labs Lab 12/28/15 1936 12/30/15 0634 01/01/16 0442 01/02/16 0408 01/03/16 0213  WBC 6.0 6.8 6.5 8.1 7.4  NEUTROABS  --  5.1 4.4 6.0 4.8  HGB 9.8* 9.6* 9.6* 11.6* 12.0  HCT 30.3* 29.7* 30.3* 36.5 37.9  MCV 73.9* 73.7* 74.3* 73.4* 73.9*  PLT 209 198 224 305 026   Basic Metabolic Panel:  Recent Labs Lab 12/30/15 0634 12/30/15 1028 01/01/16 0442 01/02/16 0408 01/03/16 0213  NA 137 139 138 137 137  K 3.7 3.6 3.4* 3.9 3.5  CL 105 106 98* 97* 98*  CO2 22 26 32 27 27  GLUCOSE 100* 93 103* 107* 108*  BUN 10 9 9 12 16     CREATININE 0.73 0.79 0.87 0.91 0.97  CALCIUM 7.4* 7.5* 8.3* 9.2 9.2  MG  --  2.7* 1.9 2.3  --   PHOS  --  5.1* 5.6* 4.9*  --    GFR: Estimated Creatinine Clearance: 107.8 mL/min (by C-G formula based on SCr of 0.97 mg/dL). Liver Function Tests:  Recent Labs Lab 12/28/15 1936 12/30/15 0634 12/30/15 1028  AST 40 25 23  ALT 31 24 22   ALKPHOS 125 110 107  BILITOT 0.6 0.6 0.4  PROT 6.9 7.3 7.0  ALBUMIN 3.1* 3.1* 3.0*   No results for input(s): LIPASE, AMYLASE in the last 168 hours. No results for input(s): AMMONIA in the last 168 hours. Coagulation Profile: No results for input(s): INR, PROTIME in the last 168 hours. Cardiac Enzymes: No results for input(s): CKTOTAL, CKMB, CKMBINDEX, TROPONINI in the last 168 hours. BNP (last 3 results) No results for input(s): PROBNP in the last 8760 hours. HbA1C: No results for input(s): HGBA1C in the last 72 hours. CBG: No results for input(s): GLUCAP in the last 168 hours. Lipid Profile: No results for input(s): CHOL, HDL, LDLCALC, TRIG, CHOLHDL, LDLDIRECT in the last 72 hours. Thyroid Function Tests: No results for input(s): TSH, T4TOTAL, FREET4, T3FREE, THYROIDAB in the last 72 hours. Anemia Panel: No results for input(s): VITAMINB12, FOLATE, FERRITIN, TIBC, IRON, RETICCTPCT in the last 72 hours. Sepsis Labs:  Recent Labs Lab 12/30/15 1028 01/01/16 0442  PROCALCITON <0.10 <0.10    Recent Results (from the past 240 hour(s))  Culture, blood (routine x 2) Call MD if unable to obtain prior to antibiotics being given     Status: None (Preliminary result)   Collection Time: 12/29/15  1:17 AM  Result Value Ref Range Status   Specimen Description BLOOD RIGHT ARM  Final   Special Requests BOTTLES DRAWN AEROBIC AND ANAEROBIC 5CC  Final   Culture   Final    NO GROWTH 4 DAYS Performed at [facility]    Report Status PENDING  Incomplete  Culture, blood (routine x 2) Call MD if unable to obtain prior to antibiotics being given      Status: None (Preliminary result)   Collection Time: 12/29/15  1:21 AM  Result Value Ref Range Status   Specimen Description BLOOD RIGHT HAND  Final   Special Requests BOTTLES DRAWN AEROBIC AND ANAEROBIC 5CC  Final   Culture   Final    NO GROWTH 4 DAYS Performed at [facility]    Report Status PENDING  Incomplete  Culture, respiratory (NON-Expectorated)     Status: None   Collection Time: 12/29/15 10:20 PM  Result Value Ref Range Status   Specimen Description SPUTUM  Final   Special Requests Normal  Final   Gram Stain   Final    RARE WBC PRESENT, PREDOMINANTLY PMN FEW SQUAMOUS EPITHELIAL CELLS PRESENT NO ORGANISMS SEEN Performed at [facility]  Final   Report Status 01/01/2016 FINAL  Final  MRSA PCR Screening     Status: None   Collection Time: 12/30/15  9:57 AM  Result Value Ref Range Status   MRSA by PCR NEGATIVE NEGATIVE Final    Comment:        The GeneXpert MRSA Assay (FDA approved for NASAL specimens only), is one component of a comprehensive MRSA colonization surveillance program. It is not intended to diagnose MRSA infection nor to guide or monitor treatment for MRSA infections.          Radiology Studies: Dg Chest Port 1 View  Result Date: 01/02/2016 CLINICAL DATA:  Acute respiratory failure. EXAM: PORTABLE CHEST 1 VIEW COMPARISON:  01/01/2016 and CT chest 12/30/2015. FINDINGS: Trachea is midline. Heart size stable. Lungs are low in volume with patchy bilateral airspace opacification, similar to yesterday's exam. No definite pleural fluid. Right hemidiaphragm is elevated. IMPRESSION: Patchy bilateral airspace opacification, unchanged from the prior exam, most indicative of pneumonia. Electronically Signed   By: Lorin Picket M.D.   On: 01/02/2016 07:09   Dg Chest Port 1 View  Result Date: 01/01/2016 CLINICAL DATA:  Acute respiratory failure. Cough and shortness of breath. EXAM: PORTABLE CHEST 1  VIEW COMPARISON:  December 9 1,017 FINDINGS: Interval worsening of the right-sided pulmonary infiltrate. Infiltrate in the left mid lower lung is stable. No other interval changes. IMPRESSION: Worsening multifocal infiltrate. Electronically Signed   By: Dorise Bullion III M.D   On: 01/01/2016 14:26        Scheduled Meds: . budesonide (PULMICORT) nebulizer solution  0.5 mg Nebulization BID  . furosemide  80 mg Intravenous Q8H  . guaiFENesin  600 mg Oral BID  . hydroxychloroquine  200 mg Oral TID  . ibuprofen  600 mg Oral Q6H  . ipratropium  0.5 mg Nebulization Q6H  . mouth rinse  15 mL Mouth Rinse BID  . pantoprazole  40 mg Oral BID  . polyethylene glycol  17 g Oral BID  . potassium chloride  40 mEq Oral BID   Continuous Infusions:   LOS: 6 days    Time spent: 35 minutes.     Elmarie Shiley, MD [facility] Pager [phone]  If 7PM-7AM, please contact night-coverage [email or web address] Password [facility] 01/03/2016, 8:53 AM

## 2016-01-03 NOTE — Progress Notes (Signed)
PULMONARY / CRITICAL CARE MEDICINE   Name: Laura Rowland MRN: [id number] DOB: 04-11-1975    ADMISSION DATE:  12/28/2015 CONSULTATION DATE:  12/30/2015  REFERRING MD:  Dr. Landry Mellow  CHIEF COMPLAINT:  SOB  brief Patient is a 40 year old female, known to have scleroderma involving her lungs, for which she goes to [facility] (Dr. [name]), for the last 12 years. It was diagnosed during her first pregnancy. As far as the patient is concerned, scleroderma is affected her lungs with interstitial lung disease but this has been clinically stable the last 6 years. She only takes Plaquenel.  Patient is a nonsmoker. She has mild asthma which has been stable and controlled with albuterol inhaler.   She gave birth on November 29 via elective cesarean section because of her scleroderma. No complications during and immediately after her pregnancy. She got discharged at day after the cesarean section. Since discharge, she had worsening swelling of her legs, reaching to her thighs. She called her regular doctor and was advised by mouth medicines. She started being short of breath 2 days before admission which significantly worsened. On the day of admission, she remained short of breath with minimal exertion, not better with her albuterol. Because of her worsening symptoms, she ended up going to the emergency room.  She was treated as a preeclampsia on admission. Her blood pressure was elevated in the [id number] systolic which was new for patient. She got magnesium sulfate pushes and over 24 hours. The last 12 hours, she got more winded. She ended up getting Lasix and started to put out urine. By the time I saw her, she has made 7.6 L of urine and was -6.5 L. She said she is better compared to when she came in but still has some fluid and swelling in her legs. Still more SOB than baseline.   She denies fevers. Had some dry cough. No sick contacts.   STUDIES and events Chest CTA 12/ 8 > ILD, B effusion, patchy B  infiltrates Echo 12/8 >> ef 55%. PASP could not be estiamted  11/29 > Elective CS 12/6 > admit for SOB.  Treated as preeclampsia   EVENTS 12/8 =- Short of breath but better the last 12 hours after diuresis. Denies chest pain, fevers, chills. Still with edema in her legs.  12/31/15 - says 50% bettter since admission. Lasix helped but still with class 4 dyspnea, face mask o2. Edema improved per RN and patient. RN says prn lasix ony. Patient says she no longer has ILD - but 2015 Ct does show basal honeucombing. Never been on [place] meds ., Was on cellcept many  Years ago   SUBJECTIVE/OVERNIGHT/INTERVAL HX Called back to see patient for improving hypoxemia  VITAL SIGNS: BP 115/63 (BP Location: Right Arm)   Pulse (!) 105   Temp 98.3 F (36.8 C) (Oral)   Resp (!) 35   Ht 6' (1.829 m)   Wt 107.3 kg (236 lb 9.6 oz)   SpO2 95%   BMI 32.09 kg/m   HEMODYNAMICS:    VENTILATOR SETTINGS:    INTAKE / OUTPUT: I/O last 3 completed shifts: In: 960 [P.O.:960] Out: 5500 [Urine:5500]  PHYSICAL EXAMINATION: General:  Awake, comfortable, no respiratory distress but anxious appearing this AM Neuro:  Cranial nerves grossly intact. No lateralizing signs. HEENT:  PERLA. Prominent NV. (-) oral thrush Cardiovascular:  Good s1/s2. (-) s3/m/r/g Lungs:  Good ae. Crackles mid-bases. (-) wheeze/rhonchi. (-) accesory muscle use. TACHYPNEIC Abdomen:  (+) BS, soft, obese. (-) tenderness. (-)  d/c per wound Musculoskeletal:  Gr 1 edema. (-) clubbing/cyanosis Skin:  Warm and dry.   LABS:  PULMONARY No results for input(s): PHART, PCO2ART, PO2ART, HCO3, TCO2, O2SAT in the last 168 hours.  Invalid input(s): PCO2, PO2  CBC  Recent Labs Lab 01/01/16 0442 01/02/16 0408 01/03/16 0213  HGB 9.6* 11.6* 12.0  HCT 30.3* 36.5 37.9  WBC 6.5 8.1 7.4  PLT 224 305 316    COAGULATION No results for input(s): INR in the last 168 hours.  CARDIAC  No results for input(s): TROPONINI in the last 168  hours. No results for input(s): PROBNP in the last 168 hours.   CHEMISTRY  Recent Labs Lab 12/30/15 0634 12/30/15 1028 01/01/16 0442 01/02/16 0408 01/03/16 0213  NA 137 139 138 137 137  K 3.7 3.6 3.4* 3.9 3.5  CL 105 106 98* 97* 98*  CO2 22 26 32 27 27  GLUCOSE 100* 93 103* 107* 108*  BUN 10 9 9 12 16   CREATININE 0.73 0.79 0.87 0.91 0.97  CALCIUM 7.4* 7.5* 8.3* 9.2 9.2  MG  --  2.7* 1.9 2.3  --   PHOS  --  5.1* 5.6* 4.9*  --    Estimated Creatinine Clearance: 107.8 mL/min (by C-G formula based on SCr of 0.97 mg/dL).   LIVER  Recent Labs Lab 12/28/15 1936 12/30/15 0634 12/30/15 1028  AST 40 25 23  ALT 31 24 22   ALKPHOS 125 110 107  BILITOT 0.6 0.6 0.4  PROT 6.9 7.3 7.0  ALBUMIN 3.1* 3.1* 3.0*     INFECTIOUS  Recent Labs Lab 12/30/15 1028 01/01/16 0442  PROCALCITON <0.10 <0.10     ENDOCRINE CBG (last 3)  No results for input(s): GLUCAP in the last 72 hours.       IMAGING x48h  - image(s) personally visualized  -   highlighted in bold Dg Chest Port 1 View  Result Date: 01/02/2016 CLINICAL DATA:  Acute respiratory failure. EXAM: PORTABLE CHEST 1 VIEW COMPARISON:  01/01/2016 and CT chest 12/30/2015. FINDINGS: Trachea is midline. Heart size stable. Lungs are low in volume with patchy bilateral airspace opacification, similar to yesterday's exam. No definite pleural fluid. Right hemidiaphragm is elevated. IMPRESSION: Patchy bilateral airspace opacification, unchanged from the prior exam, most indicative of pneumonia. Electronically Signed   By: Lorin Picket M.D.   On: 01/02/2016 07:09   Dg Chest Port 1 View  Result Date: 01/01/2016 CLINICAL DATA:  Acute respiratory failure. Cough and shortness of breath. EXAM: PORTABLE CHEST 1 VIEW COMPARISON:  December 9 1,017 FINDINGS: Interval worsening of the right-sided pulmonary infiltrate. Infiltrate in the left mid lower lung is stable. No other interval changes. IMPRESSION: Worsening multifocal infiltrate.  Electronically Signed   By: Dorise Bullion III M.D   On: 01/01/2016 14:26   I reviewed CXR myself, pulmonary edema noted.  DISCUSSION: 66 female, status post cesarean section on November 29 electively because of her history of scleroderma, comes in with worsening dyspnea and edema since delivery. She was diagnosed with preeclampsia. Chest CTA shows baseline interstitial lung disease as well as bilateral effusion and patchy groundglass infiltrates.  Her scleroderma has affected her lungs with interstitial lung disease. No other complications with scleroderma other than esophageal dilatation.  Clinically, she presents as pulmonary edema related to preeclampsia. No symptoms suggestive of infection. With a chest CTA, which cannot rule out infection altogether. She has improved with diuresis but still has fluid on board.   ASSESSMENT   #Chronic   Interstitial lung  disease with scleroderma.  Scleroderma affecting her lungs as well as esophagus. Possibly with pulmonary hypertension, mild.  Asthma, mild intermittent, not in exacerbation.   Acute : hypoxemic respiratory failure secondary to acute pulmonary edema related to preeclampsia and volume status, likely diastolic heart failure.  No evidence of infection  Acute pulmonary edema  Plan : 1. Decrease lasix to 80 mg IV BID. 2. Monitor off antibiotics, no need for abx at this time. 3. DVT prophylaxis.  4 Pulmicort neb 0.5 mg BID and atrovent qid.  5 Rx GERD 6. Asp precaution. 7. Continue to decrease O2, down to 3L. 8. Fent x 1 for dyspnea 9. Encourage ambulation. 10. Additional dose of potassium ordered. 11. CXR today and in AM.  - Discussed with PCCM-NP.  Rush Farmer, M.D. [facility] Pulmonary/Critical Care Medicine. Pager: [phone]. After hours pager: [phone].  01/03/2016 11:40 AM

## 2016-01-04 ENCOUNTER — Inpatient Hospital Stay (HOSPITAL_COMMUNITY): Payer: Medicare HMO

## 2016-01-04 LAB — CBC WITH DIFFERENTIAL/PLATELET
Basophils Absolute: 0 10*3/uL (ref 0.0–0.1)
Basophils Relative: 1 %
Eosinophils Absolute: 0.2 10*3/uL (ref 0.0–0.7)
Eosinophils Relative: 3 %
HEMATOCRIT: 38.4 % (ref 36.0–46.0)
HEMOGLOBIN: 12.2 g/dL (ref 12.0–15.0)
Lymphocytes Relative: 27 %
Lymphs Abs: 1.8 10*3/uL (ref 0.7–4.0)
MCH: 23.3 pg — AB (ref 26.0–34.0)
MCHC: 31.8 g/dL (ref 30.0–36.0)
MCV: 73.4 fL — ABNORMAL LOW (ref 78.0–100.0)
MONOS PCT: 8 %
Monocytes Absolute: 0.5 10*3/uL (ref 0.1–1.0)
NEUTROS ABS: 4.1 10*3/uL (ref 1.7–7.7)
NEUTROS PCT: 61 %
Platelets: 360 10*3/uL (ref 150–400)
RBC: 5.23 MIL/uL — AB (ref 3.87–5.11)
RDW: 15.8 % — ABNORMAL HIGH (ref 11.5–15.5)
WBC: 6.6 10*3/uL (ref 4.0–10.5)

## 2016-01-04 LAB — BASIC METABOLIC PANEL
ANION GAP: 13 (ref 5–15)
BUN: 23 mg/dL — ABNORMAL HIGH (ref 6–20)
CO2: 25 mmol/L (ref 22–32)
Calcium: 9.5 mg/dL (ref 8.9–10.3)
Chloride: 101 mmol/L (ref 101–111)
Creatinine, Ser: 0.98 mg/dL (ref 0.44–1.00)
GFR calc non Af Amer: >60 mL/min (ref >60)
Glucose, Bld: 107 mg/dL — ABNORMAL HIGH (ref 65–99)
POTASSIUM: 4.5 mmol/L (ref 3.5–5.1)
Sodium: 139 mmol/L (ref 135–145)

## 2016-01-04 MED ORDER — GLYCERIN (LAXATIVE) 2.1 G RE SUPP
1.0000 | Freq: Every day | RECTAL | Status: DC | PRN
  Administered 2016-01-04: 1 via RECTAL
  Filled 2016-01-04 (×2): qty 1

## 2016-01-04 MED ORDER — FUROSEMIDE 10 MG/ML IJ SOLN
40.0000 mg | Freq: Two times a day (BID) | INTRAMUSCULAR | Status: DC
  Administered 2016-01-04 – 2016-01-05 (×2): 40 mg via INTRAVENOUS
  Filled 2016-01-04 (×2): qty 4

## 2016-01-04 MED ORDER — SENNOSIDES-DOCUSATE SODIUM 8.6-50 MG PO TABS
2.0000 | ORAL_TABLET | Freq: Once | ORAL | Status: AC
  Administered 2016-01-04: 2 via ORAL
  Filled 2016-01-04: qty 2

## 2016-01-04 NOTE — Progress Notes (Signed)
PULMONARY / CRITICAL CARE MEDICINE   Name: Laura Rowland MRN: [id number] DOB: [birth year]    ADMISSION DATE:  12/28/2015 CONSULTATION DATE:  12/30/2015  REFERRING MD:  Dr. Landry Mellow  CHIEF COMPLAINT:  SOB  brief Patient is a 40 year old female, known to have scleroderma involving her lungs, for which she goes to [facility] (Dr. [name]), for the last 12 years. It was diagnosed during her first pregnancy. As far as the patient is concerned, scleroderma is affected her lungs with interstitial lung disease but this has been clinically stable the last 6 years. She only takes Plaquenel.  Patient is a nonsmoker. She has mild asthma which has been stable and controlled with albuterol inhaler.   She gave birth on November 29 via elective cesarean section because of her scleroderma. No complications during and immediately after her pregnancy. She got discharged at day after the cesarean section. Since discharge, she had worsening swelling of her legs, reaching to her thighs. She called her regular doctor and was advised by mouth medicines. She started being short of breath 2 days before admission which significantly worsened. On the day of admission, she remained short of breath with minimal exertion, not better with her albuterol. Because of her worsening symptoms, she ended up going to the emergency room.  She was treated as a preeclampsia on admission. Her blood pressure was elevated in the [id number] systolic which was new for patient. She got magnesium sulfate pushes and over 24 hours. The last 12 hours, she got more winded. She ended up getting Lasix and started to put out urine. By the time I saw her, she has made 7.6 L of urine and was -6.5 L. She said she is better compared to when she came in but still has some fluid and swelling in her legs. Still more SOB than baseline.   She denies fevers. Had some dry cough. No sick contacts.   STUDIES and events Chest CTA 12/ 8 > ILD, B effusion, patchy B  infiltrates Echo 12/8 >> ef 55%. PASP could not be estiamted  11/29 > Elective CS 12/6 > admit for SOB.  Treated as preeclampsia   EVENTS 12/8 =- Short of breath but better the last 12 hours after diuresis. Denies chest pain, fevers, chills. Still with edema in her legs.  12/31/15 - says 50% bettter since admission. Lasix helped but still with class 4 dyspnea, face mask o2. Edema improved per RN and patient. RN says prn lasix ony. Patient says she no longer has ILD - but 2015 Ct does show basal honeucombing. Never been on [place] meds ., Was on cellcept many  Years ago   SUBJECTIVE/OVERNIGHT/INTERVAL HX No events overnight, feels much better  VITAL SIGNS: BP 122/79 (BP Location: Left Arm)   Pulse 100   Temp 98.1 F (36.7 C) (Oral)   Resp (!) 31   Ht 6' (1.829 m)   Wt 106.8 kg (235 lb 8 oz)   SpO2 100%   BMI 31.94 kg/m   HEMODYNAMICS:    VENTILATOR SETTINGS:    INTAKE / OUTPUT: I/O last 3 completed shifts: In: 720 [P.O.:720] Out: 2650 [Urine:2650]  PHYSICAL EXAMINATION: General:  Awake, comfortable, walking the hallway on 2L [place] Neuro:  Cranial nerves grossly intact. No lateralizing signs. HEENT:  PERLA. Prominent NV. (-) oral thrush Cardiovascular:  Good s1/s2. (-) s3/m/r/g Lungs:  Good ae. Crackles mid-bases. (-) wheeze/rhonchi. (-) accesory muscle use. TACHYPNEIC Abdomen:  (+) BS, soft, obese. (-) tenderness. (-) d/c per wound Musculoskeletal:  Gr 1 edema. (-) clubbing/cyanosis Skin:  Warm and dry.   LABS:  PULMONARY No results for input(s): PHART, PCO2ART, PO2ART, HCO3, TCO2, O2SAT in the last 168 hours.  Invalid input(s): PCO2, PO2  CBC  Recent Labs Lab 01/02/16 0408 01/03/16 0213 01/04/16 0205  HGB 11.6* 12.0 12.2  HCT 36.5 37.9 38.4  WBC 8.1 7.4 6.6  PLT 305 316 360   COAGULATION No results for input(s): INR in the last 168 hours.  CARDIAC  No results for input(s): TROPONINI in the last 168 hours. No results for input(s): PROBNP in the last  168 hours.  CHEMISTRY  Recent Labs Lab 12/30/15 1028 01/01/16 0442 01/02/16 0408 01/03/16 0213 01/04/16 0205  NA 139 138 137 137 139  K 3.6 3.4* 3.9 3.5 4.5  CL 106 98* 97* 98* 101  CO2 26 32 27 27 25   GLUCOSE 93 103* 107* 108* 107*  BUN 9 9 12 16  23*  CREATININE 0.79 0.87 0.91 0.97 0.98  CALCIUM 7.5* 8.3* 9.2 9.2 9.5  MG 2.7* 1.9 2.3  --   --   PHOS 5.1* 5.6* 4.9*  --   --    Estimated Creatinine Clearance: 106.4 mL/min (by C-G formula based on SCr of 0.98 mg/dL).  LIVER  Recent Labs Lab 12/28/15 1936 12/30/15 0634 12/30/15 1028  AST 40 25 23  ALT 31 24 22   ALKPHOS 125 110 107  BILITOT 0.6 0.6 0.4  PROT 6.9 7.3 7.0  ALBUMIN 3.1* 3.1* 3.0*   INFECTIOUS  Recent Labs Lab 12/30/15 1028 01/01/16 0442  PROCALCITON <0.10 <0.10   ENDOCRINE CBG (last 3)  No results for input(s): GLUCAP in the last 72 hours.  IMAGING x48h  - image(s) personally visualized  -   highlighted in bold Dg Chest Port 1 View  Result Date: 01/04/2016 CLINICAL DATA:  Pulmonary edema. EXAM: PORTABLE CHEST 1 VIEW COMPARISON:  01/03/2016, 12/30/2015.  CT 01/30/2015. FINDINGS: Mediastinum unremarkable. Heart size normal. Partial clearing of bilateral multifocal pulmonary infiltrates/edema with persistent prominent infiltrates/ edema in the left mid and lower lung fields. Bibasilar atelectasis. Bronchiectatic changes again noted in the right lung base. No pleural effusion or pneumothorax. No acute bony abnormality . IMPRESSION: 1. Partial clearing of bilateral multifocal pulmonary infiltrates/edema with persistent prominent infiltrates/ edema in the left mid and lower lung fields. 2. Bibasilar atelectasis. Bronchiectatic changes again in the right lung base. Electronically Signed   By: Marcello Moores  Register   On: 01/04/2016 06:32   Dg Chest Port 1 View  Result Date: 01/03/2016 CLINICAL DATA:  40 year old female persistent cough since last night. Initial encounter. EXAM: PORTABLE CHEST 1 VIEW COMPARISON:   01/02/2016 and earlier. FINDINGS: Portable AP semi upright view at at 1231 hours. Stable extensive areas of Patchy and confluent pulmonary opacity, mostly interstitial in nature. Continued low lung volumes. No superimposed pneumothorax or pleural effusion. Normal cardiac size and mediastinal contours. Visualized tracheal air column is within normal limits. IMPRESSION: Widespread bilateral pulmonary opacities suggestive of viral/atypical respiratory infection have not significantly changed. No new cardiopulmonary abnormality. Electronically Signed   By: Genevie Ann M.D.   On: 01/03/2016 12:43   I reviewed CXR myself, resolving edema noted.  DISCUSSION: 59 female, status post cesarean section on November 29 electively because of her history of scleroderma, comes in with worsening dyspnea and edema since delivery. She was diagnosed with preeclampsia. Chest CTA shows baseline interstitial lung disease as well as bilateral effusion and patchy groundglass infiltrates.  Her scleroderma has affected her lungs with  interstitial lung disease. No other complications with scleroderma other than esophageal dilatation.  ASSESSMENT   #Chronic   Interstitial lung disease with scleroderma.  Scleroderma affecting her lungs as well as esophagus. Possibly with pulmonary hypertension, mild.  Asthma, mild intermittent, not in exacerbation.   Acute : hypoxemic respiratory failure secondary to acute pulmonary edema related to preeclampsia and volume status, likely diastolic heart failure.  No evidence of infection  Acute pulmonary edema  Plan : 1. Decrease lasix to 40 mg IV BID. 2. Monitor off antibiotics, no need for abx at this time. 3. DVT prophylaxis.  4 Pulmicort neb 0.5 mg BID and atrovent qid.  5 Rx GERD 6. Asp precaution. 7. Continue to decrease O2, down to 2L. 8. D/C fentanyl, no further deterioration in SOB 9. Ambulate 10. IS per RT protocol  Discussed with PCCM-NP.  PCCM will sign off, please  call back if needed.  Rush Farmer, M.D. [facility] Pulmonary/Critical Care Medicine. Pager: [phone]. After hours pager: [phone].  01/04/2016 9:30 AM

## 2016-01-04 NOTE — Progress Notes (Signed)
PROGRESS NOTE  Laura Rowland [id number] DOB: Nov 01, 1975 DOA: 12/28/2015 PCP: [name], MD   LOS: 7 days   Brief Narrative: 40 year old with PMH significant for Scleroderma, involving lungs, patient S/P Delivery Nov 29, presents with Dyspnea, Acute hypoxic respiratory failure. Patient thought to have Pulmonary edema related to preeclampsia. Patient transfer to [facility] under CCM service. [facility] asked to take over care. Pulmonary will continue to follow as consultant.   Assessment & Plan: Active Problems:   Preeclampsia in postpartum period   Acute pulmonary edema (HCC)   Acute respiratory failure with hypoxia (HCC)   Dyspnea   SOB (shortness of breath)   Acute Hypoxic Respiratory Failure - Multifactorial, Pulmonary edema from preeclampsia, ILD from scleroderma,  - CTA on admission negative for PE - Negative 20 L  - Continue with IV lasix, changed today to 40 twice a day - ECHO; normal Ef and with diastolic dysfunction.  - Sputum culture; growing few streptococcus, patient remain afebrile, WBC normal, discussed with CCM would not start antibiotics at this time.  Weight: 282--- 251--- 239----236----235  Scleroderma - Continue with Plaquenil - tried calling Dr. [name] @ [phone], no answer.  Constipation - started Miralax, sennokot, suppository prn  HTN - PRN hydralazine.  - On lasix.  - controlled  DVT prophylaxis: SCD Code Status: Full code Family Communication: no family bedside Disposition Plan: home when ready, transfer to telemetry today   Consultants:   PCCM  Procedures:   2D echo  Antimicrobials:  None    Subjective: - no chest pain, no abdominal pain, nausea or vomiting.  - Feels better in terms of her shortness of breath  Objective: Vitals:   01/04/16 0736 01/04/16 0833 01/04/16 0840 01/04/16 1118  BP: 122/79   114/71  Pulse: 100   93  Resp: (!) 31   (!) 21  Temp: 98.1 F (36.7 C)   97.9 F (36.6 C)  TempSrc: Oral    Oral  SpO2: 95% 99% 100% 95%  Weight:      Height:        Intake/Output Summary (Last 24 hours) at 01/04/16 1324 Last data filed at 01/04/16 1227  Gross per 24 hour  Intake              480 ml  Output             1775 ml  Net            -1295 ml   Filed Weights   01/02/16 1107 01/03/16 0500 01/04/16 0500  Weight: 108.8 kg (239 lb 12.8 oz) 107.3 kg (236 lb 9.6 oz) 106.8 kg (235 lb 8 oz)    Examination: Constitutional: NAD Vitals:   01/04/16 0736 01/04/16 0833 01/04/16 0840 01/04/16 1118  BP: 122/79   114/71  Pulse: 100   93  Resp: (!) 31   (!) 21  Temp: 98.1 F (36.7 C)   97.9 F (36.6 C)  TempSrc: Oral   Oral  SpO2: 95% 99% 100% 95%  Weight:      Height:       Eyes:  lids and conjunctivae normal Respiratory: clear to auscultation bilaterally, no wheezing, perhaps faint crackles. Cardiovascular: Regular rate and rhythm, no murmurs / rubs / gallops. No LE edema. 2+ pedal pulses. No carotid bruits.  Abdomen: no tenderness. Bowel sounds positive.  Musculoskeletal: no clubbing / cyanosis.  Skin: no rashes, lesions, ulcers. No induration Neurologic: non focal    Data Reviewed: I have personally  reviewed following labs and imaging studies  CBC:  Recent Labs Lab 12/30/15 0634 01/01/16 0442 01/02/16 0408 01/03/16 0213 01/04/16 0205  WBC 6.8 6.5 8.1 7.4 6.6  NEUTROABS 5.1 4.4 6.0 4.8 4.1  HGB 9.6* 9.6* 11.6* 12.0 12.2  HCT 29.7* 30.3* 36.5 37.9 38.4  MCV 73.7* 74.3* 73.4* 73.9* 73.4*  PLT 198 224 305 316 [id number]   Basic Metabolic Panel:  Recent Labs Lab 12/30/15 1028 01/01/16 0442 01/02/16 0408 01/03/16 0213 01/04/16 0205  NA 139 138 137 137 139  K 3.6 3.4* 3.9 3.5 4.5  CL 106 98* 97* 98* 101  CO2 26 32 27 27 25   GLUCOSE 93 103* 107* 108* 107*  BUN 9 9 12 16  23*  CREATININE 0.79 0.87 0.91 0.97 0.98  CALCIUM 7.5* 8.3* 9.2 9.2 9.5  MG 2.7* 1.9 2.3  --   --   PHOS 5.1* 5.6* 4.9*  --   --    GFR: Estimated Creatinine Clearance: 106.4 mL/min (by C-G  formula based on SCr of 0.98 mg/dL). Liver Function Tests:  Recent Labs Lab 12/28/15 1936 12/30/15 0634 12/30/15 1028  AST 40 25 23  ALT 31 24 22   ALKPHOS 125 110 107  BILITOT 0.6 0.6 0.4  PROT 6.9 7.3 7.0  ALBUMIN 3.1* 3.1* 3.0*   No results for input(s): LIPASE, AMYLASE in the last 168 hours. No results for input(s): AMMONIA in the last 168 hours. Coagulation Profile: No results for input(s): INR, PROTIME in the last 168 hours. Cardiac Enzymes: No results for input(s): CKTOTAL, CKMB, CKMBINDEX, TROPONINI in the last 168 hours. BNP (last 3 results) No results for input(s): PROBNP in the last 8760 hours. HbA1C: No results for input(s): HGBA1C in the last 72 hours. CBG: No results for input(s): GLUCAP in the last 168 hours. Lipid Profile: No results for input(s): CHOL, HDL, LDLCALC, TRIG, CHOLHDL, LDLDIRECT in the last 72 hours. Thyroid Function Tests: No results for input(s): TSH, T4TOTAL, FREET4, T3FREE, THYROIDAB in the last 72 hours. Anemia Panel: No results for input(s): VITAMINB12, FOLATE, FERRITIN, TIBC, IRON, RETICCTPCT in the last 72 hours. Urine analysis:    Component Value Date/Time   COLORURINE STRAW (A) 12/28/2015 1915   APPEARANCEUR CLEAR 12/28/2015 1915   LABSPEC 1.005 12/28/2015 1915   PHURINE 7.0 12/28/2015 1915   GLUCOSEU NEGATIVE 12/28/2015 1915   HGBUR LARGE (A) 12/28/2015 [place] NEGATIVE 12/28/2015 [place] NEGATIVE 12/28/2015 1915   PROTEINUR NEGATIVE 12/28/2015 1915   NITRITE NEGATIVE 12/28/2015 1915   LEUKOCYTESUR MODERATE (A) 12/28/2015 1915   Sepsis Labs: Invalid input(s): PROCALCITONIN, LACTICIDVEN  Recent Results (from the past 240 hour(s))  Culture, blood (routine x 2) Call MD if unable to obtain prior to antibiotics being given     Status: None   Collection Time: 12/29/15  1:17 AM  Result Value Ref Range Status   Specimen Description BLOOD RIGHT ARM  Final   Special Requests BOTTLES DRAWN AEROBIC AND ANAEROBIC  5CC  Final   Culture   Final    NO GROWTH 5 DAYS Performed at [facility]    Report Status 01/03/2016 FINAL  Final  Culture, blood (routine x 2) Call MD if unable to obtain prior to antibiotics being given     Status: None   Collection Time: 12/29/15  1:21 AM  Result Value Ref Range Status   Specimen Description BLOOD RIGHT HAND  Final   Special Requests BOTTLES DRAWN AEROBIC AND ANAEROBIC 5CC  Final   Culture  Final    NO GROWTH 5 DAYS Performed at [facility]    Report Status 01/03/2016 FINAL  Final  Culture, respiratory (NON-Expectorated)     Status: None   Collection Time: 12/29/15 10:20 PM  Result Value Ref Range Status   Specimen Description SPUTUM  Final   Special Requests Normal  Final   Gram Stain   Final    RARE WBC PRESENT, PREDOMINANTLY PMN FEW SQUAMOUS EPITHELIAL CELLS PRESENT NO ORGANISMS SEEN Performed at [facility]  Final   Report Status 01/01/2016 FINAL  Final  MRSA PCR Screening     Status: None   Collection Time: 12/30/15  9:57 AM  Result Value Ref Range Status   MRSA by PCR NEGATIVE NEGATIVE Final    Comment:        The GeneXpert MRSA Assay (FDA approved for NASAL specimens only), is one component of a comprehensive MRSA colonization surveillance program. It is not intended to diagnose MRSA infection nor to guide or monitor treatment for MRSA infections.       Radiology Studies: Dg Chest Port 1 View  Result Date: 01/04/2016 CLINICAL DATA:  Pulmonary edema. EXAM: PORTABLE CHEST 1 VIEW COMPARISON:  01/03/2016, 12/30/2015.  CT 01/30/2015. FINDINGS: Mediastinum unremarkable. Heart size normal. Partial clearing of bilateral multifocal pulmonary infiltrates/edema with persistent prominent infiltrates/ edema in the left mid and lower lung fields. Bibasilar atelectasis. Bronchiectatic changes again noted in the right lung base. No pleural effusion or pneumothorax. No acute bony abnormality .  IMPRESSION: 1. Partial clearing of bilateral multifocal pulmonary infiltrates/edema with persistent prominent infiltrates/ edema in the left mid and lower lung fields. 2. Bibasilar atelectasis. Bronchiectatic changes again in the right lung base. Electronically Signed   By: Marcello Moores  Register   On: 01/04/2016 06:32   Dg Chest Port 1 View  Result Date: 01/03/2016 CLINICAL DATA:  40 year old female persistent cough since last night. Initial encounter. EXAM: PORTABLE CHEST 1 VIEW COMPARISON:  01/02/2016 and earlier. FINDINGS: Portable AP semi upright view at at 1231 hours. Stable extensive areas of Patchy and confluent pulmonary opacity, mostly interstitial in nature. Continued low lung volumes. No superimposed pneumothorax or pleural effusion. Normal cardiac size and mediastinal contours. Visualized tracheal air column is within normal limits. IMPRESSION: Widespread bilateral pulmonary opacities suggestive of viral/atypical respiratory infection have not significantly changed. No new cardiopulmonary abnormality. Electronically Signed   By: Genevie Ann M.D.   On: 01/03/2016 12:43     Scheduled Meds: . budesonide (PULMICORT) nebulizer solution  0.5 mg Nebulization BID  . furosemide  40 mg Intravenous Q12H  . guaiFENesin  600 mg Oral BID  . hydroxychloroquine  200 mg Oral TID  . ibuprofen  600 mg Oral Q6H  . ipratropium  0.5 mg Nebulization Q6H  . mouth rinse  15 mL Mouth Rinse BID  . pantoprazole  40 mg Oral BID  . polyethylene glycol  17 g Oral BID  . senna-docusate  2 tablet Oral Once   Continuous Infusions:   Marzetta Board, MD, PhD [facility] Pager [phone]  If 7PM-7AM, please contact night-coverage [email or web address] Password [email or web address] 01/04/2016, 1:24 PM

## 2016-01-04 NOTE — Progress Notes (Signed)
Physical Therapy Treatment Patient Details Name: Laura Rowland MRN: [id number] DOB: 01/19/1976 Today's Date: 01/04/2016    History of Present Illness Pt adm with hypoxic respiratory failure due acute pulmonary edema related to preclampsia in postpartum period and chf. PMH - scleroderma, csection 12/21/15, interstitial lung disease.    PT Comments    Pt presented L sidelying in bed when PT entered room. Pt making slow progress with mobility but able to ambulate without an AD this session. All VSS throughout and SPO2 maintained >93% with pt on 2L of O2. Pt would continue to benefit from skilled physical therapy services at this time while admitted and after d/c to address her limitations in order to improve her overall safety and independence with functional mobility.   Follow Up Recommendations  No PT follow up     Equipment Recommendations  None recommended by PT    Recommendations for Other Services OT consult     Precautions / Restrictions Restrictions Weight Bearing Restrictions: No    Mobility  Bed Mobility Overal bed mobility: Modified Independent             General bed mobility comments: sidelying to sit with increased time  Transfers Overall transfer level: Needs assistance Equipment used: None Transfers: Sit to/from Stand Sit to Stand: Min guard         General transfer comment: for safety and lines  Ambulation/Gait Ambulation/Gait assistance: Supervision Ambulation Distance (Feet): 150 Feet Assistive device: None Gait Pattern/deviations: Step-through pattern;Decreased step length - right;Decreased step length - left Gait velocity: very slow Gait velocity interpretation: Below normal speed for age/gender General Gait Details: Pt ambulated on 2L of O2. SPO2 maintained at >90% throughout ambulation. Pt with decreased cadence and demonstrating pursed lip breathing towards end of ambulation.   Stairs            Wheelchair Mobility     Modified Rankin (Stroke Patients Only)       Balance Overall balance assessment: No apparent balance deficits (not formally assessed)                                  Cognition Arousal/Alertness: Awake/alert Behavior During Therapy: WFL for tasks assessed/performed Overall Cognitive Status: Within Functional Limits for tasks assessed                      Exercises      General Comments        Pertinent Vitals/Pain Pain Assessment: No/denies pain    Home Living                      Prior Function            PT Goals (current goals can now be found in the care plan section) Acute Rehab PT Goals Patient Stated Goal: return to prior level PT Goal Formulation: With patient Time For Goal Achievement: 01/10/16 Potential to Achieve Goals: Good Progress towards PT goals: Progressing toward goals    Frequency    Min 3X/week      PT Plan Current plan remains appropriate    Co-evaluation             End of Session Equipment Utilized During Treatment: Gait belt;Oxygen (2L of O2) Activity Tolerance: Patient tolerated treatment well Patient left: in chair;with call bell/phone within reach     Time: [id number] PT Time Calculation (min) (ACUTE ONLY): 29  min  Charges:  $Gait Training: 23-37 mins                    G Codes:      Laura Rowland 01-09-2016, 10:41 AM Sherie Don, PT, DPT [phone]

## 2016-01-05 ENCOUNTER — Inpatient Hospital Stay (HOSPITAL_COMMUNITY): Payer: Medicare HMO

## 2016-01-05 LAB — BASIC METABOLIC PANEL
Anion gap: 13 (ref 5–15)
BUN: 26 mg/dL — ABNORMAL HIGH (ref 6–20)
CALCIUM: 9.3 mg/dL (ref 8.9–10.3)
CO2: 28 mmol/L (ref 22–32)
CREATININE: 1.34 mg/dL — AB (ref 0.44–1.00)
Chloride: 99 mmol/L — ABNORMAL LOW (ref 101–111)
GFR, EST AFRICAN AMERICAN: 57 mL/min — AB (ref >60)
GFR, EST NON AFRICAN AMERICAN: 50 mL/min — AB (ref >60)
GLUCOSE: 88 mg/dL (ref 65–99)
Potassium: 3.8 mmol/L (ref 3.5–5.1)
Sodium: 140 mmol/L (ref 135–145)

## 2016-01-05 MED ORDER — IPRATROPIUM-ALBUTEROL 0.5-2.5 (3) MG/3ML IN SOLN
3.0000 mL | Freq: Three times a day (TID) | RESPIRATORY_TRACT | Status: DC
  Administered 2016-01-05 – 2016-01-07 (×7): 3 mL via RESPIRATORY_TRACT
  Filled 2016-01-05 (×8): qty 3

## 2016-01-05 MED ORDER — IPRATROPIUM BROMIDE 0.02 % IN SOLN: 0.5000 mg | Freq: Four times a day (QID) | RESPIRATORY_TRACT | Status: DC | PRN

## 2016-01-05 NOTE — Progress Notes (Signed)
PROGRESS NOTE  Laura Rowland [id number] DOB: Nov 04, 1975 DOA: 12/28/2015 PCP: [name], MD   LOS: 8 days   Brief Narrative: 40 year old with PMH significant for Scleroderma, involving lungs, patient S/P Delivery Nov 29, presents with Dyspnea, Acute hypoxic respiratory failure. Patient thought to have Pulmonary edema related to preeclampsia. Patient transfer to [facility] cone under CCM service. TRH asked to take over care. Pulmonary will continue to follow as consultant.   Assessment & Plan: Active Problems:   Preeclampsia in postpartum period   Acute pulmonary edema (HCC)   Acute respiratory failure with hypoxia (HCC)   Dyspnea   SOB (shortness of breath)   Acute Hypoxic Respiratory Failure - Multifactorial, Pulmonary edema from preeclampsia, ILD from scleroderma,  - CTA on admission negative for PE - Negative 20 L  - Continue with IV lasix, changed today to 40 twice a day - ECHO; normal Ef and with diastolic dysfunction.  - Sputum culture; growing few streptococcus, patient remain afebrile, WBC normal, discussed with CCM would not start antibiotics at this time.  Weight: 282--- 251--- 239----236----235----235 - stop Lasix, slight Cr bump today  - wean off oxygen as tolerated   Scleroderma - Continue with Plaquenil - tried calling Dr. [name] on 12/13 @ [phone], no answer.  Constipation - started Miralax, sennokot, suppository prn  HTN - PRN hydralazine.  - On lasix.  - controlled   DVT prophylaxis: SCD Code Status: Full code Family Communication: no family bedside Disposition Plan: home when ready, PT to evaluate today  Consultants:   PCCM  Procedures:   2D echo  Antimicrobials:  None    Subjective: - no chest pain, no abdominal pain, nausea or vomiting.  - feels like she is improving.   Objective: Vitals:   01/05/16 0454 01/05/16 0500 01/05/16 0506 01/05/16 0903  BP: 124/75  130/77   Pulse: 74  96   Resp:   20   Temp:   97.5  F (36.4 C)   TempSrc:   Oral   SpO2: 98%   98%  Weight:  106.6 kg (235 lb 1.6 oz)    Height:        Intake/Output Summary (Last 24 hours) at 01/05/16 1123 Last data filed at 01/05/16 1118  Gross per 24 hour  Intake              920 ml  Output             1300 ml  Net             -380 ml   Filed Weights   01/04/16 0500 01/04/16 1632 01/05/16 0500  Weight: 106.8 kg (235 lb 8 oz) 106.6 kg (235 lb 1.6 oz) 106.6 kg (235 lb 1.6 oz)    Examination: Constitutional: NAD Vitals:   01/05/16 0454 01/05/16 0500 01/05/16 0506 01/05/16 0903  BP: 124/75  130/77   Pulse: 74  96   Resp:   20   Temp:   97.5 F (36.4 C)   TempSrc:   Oral   SpO2: 98%   98%  Weight:  106.6 kg (235 lb 1.6 oz)    Height:       Eyes:  lids and conjunctivae normal Respiratory: clear to auscultation bilaterally, no wheezing, perhaps faint crackles. Cardiovascular: Regular rate and rhythm, no murmurs / rubs / gallops. No LE edema. 2+ pedal pulses. No carotid bruits.  Abdomen: no tenderness. Bowel sounds positive.  Musculoskeletal: no clubbing / cyanosis.  Neurologic: non focal  Data Reviewed: I have personally reviewed following labs and imaging studies  CBC:  Recent Labs Lab 12/30/15 0634 01/01/16 0442 01/02/16 0408 01/03/16 0213 01/04/16 0205  WBC 6.8 6.5 8.1 7.4 6.6  NEUTROABS 5.1 4.4 6.0 4.8 4.1  HGB 9.6* 9.6* 11.6* 12.0 12.2  HCT 29.7* 30.3* 36.5 37.9 38.4  MCV 73.7* 74.3* 73.4* 73.9* 73.4*  PLT 198 224 305 316 [id number]   Basic Metabolic Panel:  Recent Labs Lab 12/30/15 1028 01/01/16 0442 01/02/16 0408 01/03/16 0213 01/04/16 0205 01/05/16 0340  NA 139 138 137 137 139 140  K 3.6 3.4* 3.9 3.5 4.5 3.8  CL 106 98* 97* 98* 101 99*  CO2 26 32 27 27 25 28   GLUCOSE 93 103* 107* 108* 107* 88  BUN 9 9 12 16  23* 26*  CREATININE 0.79 0.87 0.91 0.97 0.98 1.34*  CALCIUM 7.5* 8.3* 9.2 9.2 9.5 9.3  MG 2.7* 1.9 2.3  --   --   --   PHOS 5.1* 5.6* 4.9*  --   --   --    GFR: Estimated Creatinine  Clearance: 77.7 mL/min (by C-G formula based on SCr of 1.34 mg/dL (H)). Liver Function Tests:  Recent Labs Lab 12/30/15 0634 12/30/15 1028  AST 25 23  ALT 24 22  ALKPHOS 110 107  BILITOT 0.6 0.4  PROT 7.3 7.0  ALBUMIN 3.1* 3.0*   No results for input(s): LIPASE, AMYLASE in the last 168 hours. No results for input(s): AMMONIA in the last 168 hours. Coagulation Profile: No results for input(s): INR, PROTIME in the last 168 hours. Cardiac Enzymes: No results for input(s): CKTOTAL, CKMB, CKMBINDEX, TROPONINI in the last 168 hours. BNP (last 3 results) No results for input(s): PROBNP in the last 8760 hours. HbA1C: No results for input(s): HGBA1C in the last 72 hours. CBG: No results for input(s): GLUCAP in the last 168 hours. Lipid Profile: No results for input(s): CHOL, HDL, LDLCALC, TRIG, CHOLHDL, LDLDIRECT in the last 72 hours. Thyroid Function Tests: No results for input(s): TSH, T4TOTAL, FREET4, T3FREE, THYROIDAB in the last 72 hours. Anemia Panel: No results for input(s): VITAMINB12, FOLATE, FERRITIN, TIBC, IRON, RETICCTPCT in the last 72 hours. Urine analysis:    Component Value Date/Time   COLORURINE STRAW (A) 12/28/2015 1915   APPEARANCEUR CLEAR 12/28/2015 1915   LABSPEC 1.005 12/28/2015 1915   PHURINE 7.0 12/28/2015 1915   GLUCOSEU NEGATIVE 12/28/2015 1915   HGBUR LARGE (A) 12/28/2015 [place] NEGATIVE 12/28/2015 [place] NEGATIVE 12/28/2015 1915   PROTEINUR NEGATIVE 12/28/2015 1915   NITRITE NEGATIVE 12/28/2015 1915   LEUKOCYTESUR MODERATE (A) 12/28/2015 1915   Sepsis Labs: Invalid input(s): PROCALCITONIN, LACTICIDVEN  Recent Results (from the past 240 hour(s))  Culture, blood (routine x 2) Call MD if unable to obtain prior to antibiotics being given     Status: None   Collection Time: 12/29/15  1:17 AM  Result Value Ref Range Status   Specimen Description BLOOD RIGHT ARM  Final   Special Requests BOTTLES DRAWN AEROBIC AND ANAEROBIC 5CC   Final   Culture   Final    NO GROWTH 5 DAYS Performed at [facility]    Report Status 01/03/2016 FINAL  Final  Culture, blood (routine x 2) Call MD if unable to obtain prior to antibiotics being given     Status: None   Collection Time: 12/29/15  1:21 AM  Result Value Ref Range Status   Specimen Description BLOOD RIGHT HAND  Final  Special Requests BOTTLES DRAWN AEROBIC AND ANAEROBIC 5CC  Final   Culture   Final    NO GROWTH 5 DAYS Performed at [facility]    Report Status 01/03/2016 FINAL  Final  Culture, respiratory (NON-Expectorated)     Status: None   Collection Time: 12/29/15 10:20 PM  Result Value Ref Range Status   Specimen Description SPUTUM  Final   Special Requests Normal  Final   Gram Stain   Final    RARE WBC PRESENT, PREDOMINANTLY PMN FEW SQUAMOUS EPITHELIAL CELLS PRESENT NO ORGANISMS SEEN Performed at [place] [facility]  Final   Report Status 01/01/2016 FINAL  Final  MRSA PCR Screening     Status: None   Collection Time: 12/30/15  9:57 AM  Result Value Ref Range Status   MRSA by PCR NEGATIVE NEGATIVE Final    Comment:        The GeneXpert MRSA Assay (FDA approved for NASAL specimens only), is one component of a comprehensive MRSA colonization surveillance program. It is not intended to diagnose MRSA infection nor to guide or monitor treatment for MRSA infections.       Radiology Studies: Dg Chest Port 1 View  Result Date: 01/05/2016 CLINICAL DATA:  Shortness of breath. EXAM: PORTABLE CHEST 1 VIEW COMPARISON:  Radiograph January 04, 2016. FINDINGS: The heart size and mediastinal contours are within normal limits. No pneumothorax or pleural effusion is noted. Stable bilateral perihilar and basilar opacities are noted concerning for edema or atypical inflammation. The visualized skeletal structures are unremarkable. IMPRESSION: Stable bilateral lung opacities as described above. Electronically Signed    By: Marijo Conception, M.D.   On: 01/05/2016 10:41   Dg Chest Port 1 View  Result Date: 01/04/2016 CLINICAL DATA:  Pulmonary edema. EXAM: PORTABLE CHEST 1 VIEW COMPARISON:  01/03/2016, 12/30/2015.  CT 01/30/2015. FINDINGS: Mediastinum unremarkable. Heart size normal. Partial clearing of bilateral multifocal pulmonary infiltrates/edema with persistent prominent infiltrates/ edema in the left mid and lower lung fields. Bibasilar atelectasis. Bronchiectatic changes again noted in the right lung base. No pleural effusion or pneumothorax. No acute bony abnormality . IMPRESSION: 1. Partial clearing of bilateral multifocal pulmonary infiltrates/edema with persistent prominent infiltrates/ edema in the left mid and lower lung fields. 2. Bibasilar atelectasis. Bronchiectatic changes again in the right lung base. Electronically Signed   By: Marcello Moores  Register   On: 01/04/2016 06:32   Dg Chest Port 1 View  Result Date: 01/03/2016 CLINICAL DATA:  40 year old female persistent cough since last night. Initial encounter. EXAM: PORTABLE CHEST 1 VIEW COMPARISON:  01/02/2016 and earlier. FINDINGS: Portable AP semi upright view at at 1231 hours. Stable extensive areas of Patchy and confluent pulmonary opacity, mostly interstitial in nature. Continued low lung volumes. No superimposed pneumothorax or pleural effusion. Normal cardiac size and mediastinal contours. Visualized tracheal air column is within normal limits. IMPRESSION: Widespread bilateral pulmonary opacities suggestive of viral/atypical respiratory infection have not significantly changed. No new cardiopulmonary abnormality. Electronically Signed   By: Genevie Ann M.D.   On: 01/03/2016 12:43     Scheduled Meds: . budesonide (PULMICORT) nebulizer solution  0.5 mg Nebulization BID  . guaiFENesin  600 mg Oral BID  . hydroxychloroquine  200 mg Oral TID  . ibuprofen  600 mg Oral Q6H  . ipratropium-albuterol  3 mL Nebulization TID  . mouth rinse  15 mL Mouth Rinse  BID  . pantoprazole  40 mg Oral BID  . polyethylene glycol  17 g Oral BID   Continuous Infusions:   Marzetta Board, MD, PhD [facility] Pager [phone]  If 7PM-7AM, please contact night-coverage [email or web address] Password [email or web address] 01/05/2016, 11:23 AM

## 2016-01-05 NOTE — Progress Notes (Signed)
Physical Therapy Treatment Patient Details Name: Laura Rowland MRN: [id number] DOB: 10/31/1975 Today's Date: 01/05/2016    History of Present Illness Pt adm with hypoxic respiratory failure due acute pulmonary edema related to preclampsia in postpartum period and chf. PMH - scleroderma, csection 12/21/15, interstitial lung disease.    PT Comments    Pt tolerated session well on RA.  O2 saturations remained between 88%-96% on RA during ambulation with initial min cues for pursed lip breathing and increased length of inhale/exhale.  Pt returned to room at end of session and RN alerted to pt continued on RA.     Follow Up Recommendations  No PT follow up     Equipment Recommendations  None recommended by PT    Recommendations for Other Services       Precautions / Restrictions Precautions Precautions: Other (comment) (watch O2 saturations during activity) Restrictions Weight Bearing Restrictions: No    Mobility  Bed Mobility Overal bed mobility: Modified Independent                Transfers Overall transfer level: Needs assistance Equipment used: None Transfers: Sit to/from Stand Sit to Stand: Supervision            Ambulation/Gait Ambulation/Gait assistance: Supervision Ambulation Distance (Feet): 20 Feet (total 50', max distance in one stretch 20' for O2 monitoring) Assistive device: None Gait Pattern/deviations: Step-through pattern;Decreased step length - right;Decreased step length - left Gait velocity: slow   General Gait Details: Pt ambulated on RA with O2 ranging from 88-96%.  Verbal cues for rest breaks and pursed lips breathing throughout   Stairs            Wheelchair Mobility    Modified Rankin (Stroke Patients Only)       Balance                                    Cognition Arousal/Alertness: Awake/alert Behavior During Therapy: WFL for tasks assessed/performed Overall Cognitive Status: Within Functional  Limits for tasks assessed                      Exercises General Exercises - Lower Extremity Hip Flexion/Marching: AROM;Both;10 reps;Standing Mini-Sqauts: AROM;Both;10 reps;Standing    General Comments        Pertinent Vitals/Pain Pain Assessment: No/denies pain    Home Living                      Prior Function            PT Goals (current goals can now be found in the care plan section) Acute Rehab PT Goals Patient Stated Goal: return to PLOF PT Goal Formulation: With patient Time For Goal Achievement: 01/10/16 Potential to Achieve Goals: Good Progress towards PT goals: Progressing toward goals    Frequency    Min 3X/week      PT Plan Current plan remains appropriate    Co-evaluation             End of Session Equipment Utilized During Treatment: Gait belt Activity Tolerance: Patient tolerated treatment well Patient left: in bed;with call bell/phone within reach     Time: 1030-1058 PT Time Calculation (min) (ACUTE ONLY): 28 min  Charges:  $Gait Training: 8-22 mins $Therapeutic Activity: 8-22 mins                    G  Codes:      Laura Rowland 01/05/2016, 11:37 AM

## 2016-01-05 NOTE — Care Management Important Message (Signed)
Important Message  Patient Details  Name: Laura Rowland MRN: [id number] Date of Birth: [birth year]   Medicare Important Message Given:  Yes    Nathen May 01/05/2016, 9:54 AM

## 2016-01-06 LAB — BASIC METABOLIC PANEL
ANION GAP: 11 (ref 5–15)
BUN: 22 mg/dL — ABNORMAL HIGH (ref 6–20)
CHLORIDE: 97 mmol/L — AB (ref 101–111)
CO2: 28 mmol/L (ref 22–32)
Calcium: 9.4 mg/dL (ref 8.9–10.3)
Creatinine, Ser: 0.91 mg/dL (ref 0.44–1.00)
Glucose, Bld: 95 mg/dL (ref 65–99)
POTASSIUM: 3.8 mmol/L (ref 3.5–5.1)
SODIUM: 136 mmol/L (ref 135–145)

## 2016-01-06 NOTE — Progress Notes (Signed)
PROGRESS NOTE  MARENDA Rowland [id number] DOB: 1975-11-15 DOA: 12/28/2015 PCP: [name], MD   LOS: 9 days   Brief Narrative: 39 year old with PMH significant for Scleroderma, involving lungs, patient S/P Delivery Nov 29, presents with Dyspnea, Acute hypoxic respiratory failure. Patient thought to have Pulmonary edema related to preeclampsia. Patient transfer to [facility] under CCM service. TRH asked to take over care. Pulmonary will continue to follow as consultant.   Assessment & Plan: Active Problems:   Preeclampsia in postpartum period   Acute pulmonary edema (HCC)   Acute respiratory failure with hypoxia (HCC)   Dyspnea   SOB (shortness of breath)   Acute Hypoxic Respiratory Failure - Multifactorial, Pulmonary edema from preeclampsia, ILD from scleroderma,  - CTA on admission negative for PE - Negative 20 L  - ECHO; normal Ef and with diastolic dysfunction.  - Sputum culture; growing few streptococcus, patient remain afebrile, WBC normal, discussed with CCM would not start antibiotics at this time.  Weight: 282--- 251--- 239----236----235----235 - stopped Lasix 12/14, slight Cr bump, now Cr back to normal  - wean off oxygen as tolerated, on room air this morning   Scleroderma - Continue with Plaquenil - tried calling Dr. [name] on 12/13 @ [phone], no answer.  Constipation - started Miralax, sennokot, suppository prn  HTN - PRN hydralazine.  - On lasix.  - controlled   DVT prophylaxis: SCD Code Status: Full code Family Communication: no family bedside Disposition Plan: home when ready, PT to evaluate today  Consultants:   PCCM  Procedures:   2D echo  Antimicrobials:  None    Subjective: - feels better, ambulating in the room without much difficulties  Objective: Vitals:   01/06/16 0518 01/06/16 0934 01/06/16 1420 01/06/16 1433  BP:    140/65  Pulse: 90 100 (!) 103 (!) 101  Resp:  20 20 20   Temp:    98.4 F (36.9 C)    TempSrc:    Oral  SpO2: 90% 90% 91% 95%  Weight:      Height:        Intake/Output Summary (Last 24 hours) at 01/06/16 1510 Last data filed at 01/06/16 1241  Gross per 24 hour  Intake              320 ml  Output                0 ml  Net              320 ml   Filed Weights   01/04/16 1632 01/05/16 0500 01/06/16 0500  Weight: 106.6 kg (235 lb 1.6 oz) 106.6 kg (235 lb 1.6 oz) 106 kg (233 lb 9.6 oz)    Examination: Constitutional: NAD Vitals:   01/06/16 0518 01/06/16 0934 01/06/16 1420 01/06/16 1433  BP:    140/65  Pulse: 90 100 (!) 103 (!) 101  Resp:  20 20 20   Temp:    98.4 F (36.9 C)  TempSrc:    Oral  SpO2: 90% 90% 91% 95%  Weight:      Height:       Eyes:  lids and conjunctivae normal Respiratory: clear to auscultation bilaterally, no wheezing, perhaps faint crackles. Cardiovascular: Regular rate and rhythm, no murmurs / rubs / gallops. No LE edema. 2+ pedal pulses. No carotid bruits.  Abdomen: no tenderness. Bowel sounds positive.  Musculoskeletal: no clubbing / cyanosis.  Neurologic: non focal    Data Reviewed: I have personally reviewed following labs  and imaging studies  CBC:  Recent Labs Lab 01/01/16 0442 01/02/16 0408 01/03/16 0213 01/04/16 0205  WBC 6.5 8.1 7.4 6.6  NEUTROABS 4.4 6.0 4.8 4.1  HGB 9.6* 11.6* 12.0 12.2  HCT 30.3* 36.5 37.9 38.4  MCV 74.3* 73.4* 73.9* 73.4*  PLT 224 305 316 [id number]   Basic Metabolic Panel:  Recent Labs Lab 01/01/16 0442 01/02/16 0408 01/03/16 0213 01/04/16 0205 01/05/16 0340 01/06/16 0353  NA 138 137 137 139 140 136  K 3.4* 3.9 3.5 4.5 3.8 3.8  CL 98* 97* 98* 101 99* 97*  CO2 32 27 27 25 28 28   GLUCOSE 103* 107* 108* 107* 88 95  BUN 9 12 16  23* 26* 22*  CREATININE 0.87 0.91 0.97 0.98 1.34* 0.91  CALCIUM 8.3* 9.2 9.2 9.5 9.3 9.4  MG 1.9 2.3  --   --   --   --   PHOS 5.6* 4.9*  --   --   --   --    GFR: Estimated Creatinine Clearance: 114.2 mL/min (by C-G formula based on SCr of 0.91 mg/dL). Liver  Function Tests: No results for input(s): AST, ALT, ALKPHOS, BILITOT, PROT, ALBUMIN in the last 168 hours. No results for input(s): LIPASE, AMYLASE in the last 168 hours. No results for input(s): AMMONIA in the last 168 hours. Coagulation Profile: No results for input(s): INR, PROTIME in the last 168 hours. Cardiac Enzymes: No results for input(s): CKTOTAL, CKMB, CKMBINDEX, TROPONINI in the last 168 hours. BNP (last 3 results) No results for input(s): PROBNP in the last 8760 hours. HbA1C: No results for input(s): HGBA1C in the last 72 hours. CBG: No results for input(s): GLUCAP in the last 168 hours. Lipid Profile: No results for input(s): CHOL, HDL, LDLCALC, TRIG, CHOLHDL, LDLDIRECT in the last 72 hours. Thyroid Function Tests: No results for input(s): TSH, T4TOTAL, FREET4, T3FREE, THYROIDAB in the last 72 hours. Anemia Panel: No results for input(s): VITAMINB12, FOLATE, FERRITIN, TIBC, IRON, RETICCTPCT in the last 72 hours. Urine analysis:    Component Value Date/Time   COLORURINE STRAW (A) 12/28/2015 1915   APPEARANCEUR CLEAR 12/28/2015 1915   LABSPEC 1.005 12/28/2015 1915   PHURINE 7.0 12/28/2015 1915   GLUCOSEU NEGATIVE 12/28/2015 1915   HGBUR LARGE (A) 12/28/2015 [place] NEGATIVE 12/28/2015 [place] NEGATIVE 12/28/2015 1915   PROTEINUR NEGATIVE 12/28/2015 1915   NITRITE NEGATIVE 12/28/2015 1915   LEUKOCYTESUR MODERATE (A) 12/28/2015 1915   Sepsis Labs: Invalid input(s): PROCALCITONIN, LACTICIDVEN  Recent Results (from the past 240 hour(s))  Culture, blood (routine x 2) Call MD if unable to obtain prior to antibiotics being given     Status: None   Collection Time: 12/29/15  1:17 AM  Result Value Ref Range Status   Specimen Description BLOOD RIGHT ARM  Final   Special Requests BOTTLES DRAWN AEROBIC AND ANAEROBIC 5CC  Final   Culture   Final    NO GROWTH 5 DAYS Performed at [facility]    Report Status 01/03/2016 FINAL  Final  Culture,  blood (routine x 2) Call MD if unable to obtain prior to antibiotics being given     Status: None   Collection Time: 12/29/15  1:21 AM  Result Value Ref Range Status   Specimen Description BLOOD RIGHT HAND  Final   Special Requests BOTTLES DRAWN AEROBIC AND ANAEROBIC 5CC  Final   Culture   Final    NO GROWTH 5 DAYS Performed at [facility]  Report Status 01/03/2016 FINAL  Final  Culture, respiratory (NON-Expectorated)     Status: None   Collection Time: 12/29/15 10:20 PM  Result Value Ref Range Status   Specimen Description SPUTUM  Final   Special Requests Normal  Final   Gram Stain   Final    RARE WBC PRESENT, PREDOMINANTLY PMN FEW SQUAMOUS EPITHELIAL CELLS PRESENT NO ORGANISMS SEEN Performed at [place] [facility]  Final   Report Status 01/01/2016 FINAL  Final  MRSA PCR Screening     Status: None   Collection Time: 12/30/15  9:57 AM  Result Value Ref Range Status   MRSA by PCR NEGATIVE NEGATIVE Final    Comment:        The GeneXpert MRSA Assay (FDA approved for NASAL specimens only), is one component of a comprehensive MRSA colonization surveillance program. It is not intended to diagnose MRSA infection nor to guide or monitor treatment for MRSA infections.       Radiology Studies: Dg Chest Port 1 View  Result Date: 01/05/2016 CLINICAL DATA:  Shortness of breath. EXAM: PORTABLE CHEST 1 VIEW COMPARISON:  Radiograph January 04, 2016. FINDINGS: The heart size and mediastinal contours are within normal limits. No pneumothorax or pleural effusion is noted. Stable bilateral perihilar and basilar opacities are noted concerning for edema or atypical inflammation. The visualized skeletal structures are unremarkable. IMPRESSION: Stable bilateral lung opacities as described above. Electronically Signed   By: Marijo Conception, M.D.   On: 01/05/2016 10:41     Scheduled Meds: . budesonide (PULMICORT) nebulizer solution  0.5 mg  Nebulization BID  . guaiFENesin  600 mg Oral BID  . hydroxychloroquine  200 mg Oral TID  . ibuprofen  600 mg Oral Q6H  . ipratropium-albuterol  3 mL Nebulization TID  . mouth rinse  15 mL Mouth Rinse BID  . pantoprazole  40 mg Oral BID  . polyethylene glycol  17 g Oral BID   Continuous Infusions:   Marzetta Board, MD, PhD [facility] Pager [phone]  If 7PM-7AM, please contact night-coverage [email or web address] Password [email or web address] 01/06/2016, 3:10 PM

## 2016-01-06 NOTE — Progress Notes (Signed)
SATURATION QUALIFICATIONS: (This note is used to comply with regulatory documentation for home oxygen)  Patient Saturations on Room Air at Rest = 96%  Patient Saturations on Room Air while Ambulating = 91%  Patient Saturations on 1 Liters of oxygen while Ambulating = 98%  Please briefly explain why patient needs home oxygen:

## 2016-01-07 ENCOUNTER — Inpatient Hospital Stay (HOSPITAL_COMMUNITY): Payer: Medicare HMO

## 2016-01-07 LAB — BASIC METABOLIC PANEL
ANION GAP: 13 (ref 5–15)
BUN: 18 mg/dL (ref 6–20)
CALCIUM: 9.5 mg/dL (ref 8.9–10.3)
CO2: 25 mmol/L (ref 22–32)
Chloride: 101 mmol/L (ref 101–111)
Creatinine, Ser: 0.98 mg/dL (ref 0.44–1.00)
GFR calc Af Amer: >60 mL/min (ref >60)
GFR calc non Af Amer: >60 mL/min (ref >60)
GLUCOSE: 97 mg/dL (ref 65–99)
Potassium: 3.5 mmol/L (ref 3.5–5.1)
Sodium: 139 mmol/L (ref 135–145)

## 2016-01-07 MED ORDER — FUROSEMIDE 40 MG PO TABS: 40.0000 mg | ORAL_TABLET | Freq: Every day | ORAL | 0 refills | Status: DC | PRN

## 2016-01-07 MED ORDER — IPRATROPIUM-ALBUTEROL 0.5-2.5 (3) MG/3ML IN SOLN: 3.0000 mL | Freq: Three times a day (TID) | RESPIRATORY_TRACT | 1 refills | Status: DC

## 2016-01-07 MED ORDER — PROAIR HFA 108 (90 BASE) MCG/ACT IN AERS: 2.0000 | INHALATION_SPRAY | RESPIRATORY_TRACT | 1 refills | Status: DC | PRN

## 2016-01-07 MED ORDER — GUAIFENESIN-DM 100-10 MG/5ML PO SYRP: 5.0000 mL | ORAL_SOLUTION | ORAL | 0 refills | Status: DC | PRN

## 2016-01-07 NOTE — Progress Notes (Signed)
Nsg Discharge Note  Admit Date:  12/28/2015 Discharge date: 01/07/2016   Laura Rowland to be D/C'd home per MD order.  AVS completed.  Copy for chart, and copy for patient signed, and dated. Patient/caregiver able to verbalize understanding.  Discharge Medication: Allergies as of 01/07/2016   No Known Allergies     Medication List    STOP taking these medications   oxyCODONE 5 MG immediate release tablet Commonly known as:  Oxy IR/ROXICODONE     TAKE these medications   esomeprazole 40 MG capsule Commonly known as:  NEXIUM Take 30- 60 min before your first and last meals of the day What changed:  how much to take  how to take this  when to take this  additional instructions   furosemide 40 MG tablet Commonly known as:  LASIX Take 1 tablet (40 mg total) by mouth daily as needed for fluid or edema.   guaiFENesin-dextromethorphan 100-10 MG/5ML syrup Commonly known as:  ROBITUSSIN DM Take 5 mLs by mouth every 4 (four) hours as needed for cough (chest congestion).   hydroxychloroquine 200 MG tablet Commonly known as:  PLAQUENIL Take 200 mg by mouth 3 (three) times daily. Pt takes 2 tablets 400mg  in the morning and one tablet 200mg  at night   ibuprofen 600 MG tablet Commonly known as:  ADVIL,MOTRIN Take 1 tablet (600 mg total) by mouth every 6 (six) hours.   ipratropium-albuterol 0.5-2.5 (3) MG/3ML Soln Commonly known as:  DUONEB Take 3 mLs by nebulization 3 (three) times daily.   IRON PO Take 1 tablet by mouth daily.   PNV PO Take 1 tablet by mouth daily.   PROAIR HFA 108 (90 Base) MCG/ACT inhaler Generic drug:  albuterol Inhale 2 puffs into the lungs every 4 (four) hours as needed for wheezing or shortness of breath.   valACYclovir 500 MG tablet Commonly known as:  VALTREX Take 500 mg by mouth daily.   Vitamin D 2000 units tablet Take 2,000 Units by mouth daily.            Durable Medical Equipment        Start     Ordered   01/07/16 1039   For home use only DME Nebulizer/meds  Once    Question:  Patient needs a nebulizer to treat with the following condition  Answer:  ILD (interstitial lung disease) ([place])   01/07/16 1039      Discharge Assessment: Vitals:   01/07/16 1012 01/07/16 1144  BP:  135/76  Pulse: 86 91  Resp: 18 18  Temp:  98.2 F (36.8 C)   Skin clean, dry and intact without evidence of skin break down, no evidence of skin tears noted. IV catheter discontinued intact. Site without signs and symptoms of complications - no redness or edema noted at insertion site, patient denies c/o pain - only slight tenderness at site.  Dressing with slight pressure applied.  D/c Instructions-Education: Discharge instructions given to patient/family with verbalized understanding. D/c education completed with patient/family including follow up instructions, medication list, d/c activities limitations if indicated, with other d/c instructions as indicated by MD - patient able to verbalize understanding, all questions fully answered. Patient instructed to return to ED, call 911, or call MD for any changes in condition.  Patient escorted via [place], and D/C home via private auto.  Wyonia Hough, RN 01/07/2016 12:49 PM

## 2016-01-07 NOTE — Care Management Note (Signed)
Case Management Note  Patient Details  Name: Laura Rowland MRN: [id number] Date of Birth: 02/16/1975  Subjective/Objective:   Preeclampsia, Acute resp failure with hypoxia                 Action/Plan: Discharge Planning: AVS reviewed:  Chart reviewed. Received referral for neb machine. Contacted [facility] for neb machine for home.   PCP [name] MD  Expected Discharge Date:  01/07/2016              Expected Discharge Plan:  Home/Self Care  In-House Referral:  NA  Discharge planning Services  CM Consult  Post Acute Care Choice:  NA Choice offered to:  NA  DME Arranged:  Nebulizer machine DME Agency:  [place]:  NA [place] Agency:  NA  Status of Service:  Completed, signed off  If discussed at [place] of Stay Meetings, dates discussed:    Additional Comments:  Erenest Rasher, RN 01/07/2016, 10:44 AM

## 2016-01-07 NOTE — Discharge Summary (Signed)
Physician Discharge Summary  Laura Rowland [id number] DOB: 09-25-1975 DOA: 12/28/2015  PCP: [name], MD  Admit date: 12/28/2015 Discharge date: 01/07/2016  Admitted From: home Disposition:  home  Recommendations for Outpatient Follow-up:  1. Follow up with PCP in 1-2 weeks  Home Health: none Equipment/Devices: nebulizer  Discharge Condition: stable CODE STATUS: Full Diet recommendation: regular  HPI: per [name], [name] (patient initially admitted to [facility] hospital then transferred to [facility] ICU) [id number], 1 week s/p repeat CS here with multiple sx. She reports worsening LE edema about 3 days ago after she was discharged from the hospital. She reports onset of SOB last night. She then had onset of dizziness and HA today. She took Percocet and had no relief. She also reports upper abdominal pain bilateral that radiates to her back. Review of chart indicates pregnancy and postpartum course was uncomplicated.  Hospital Course: Discharge Diagnoses:  Active Problems:   Preeclampsia in postpartum period   Acute pulmonary edema (HCC)   Acute respiratory failure with hypoxia (HCC)   Dyspnea   SOB (shortness of breath)  Acute Hypoxic Respiratory Failure - patient admitted to [facility] hospital with hypoxic respiratory fialure due to pulmonary edema in the setting of pre-eclampsia on underlying ILD from scleroderma. She eventually was transferred to [facility] for critical care assistance. She is s/p C section on 11/29. She was aggressively diuresed and her hypoxia resolved and was weaned off to room air. She is net negative 20L and her weight has improved from 260 lbs on admission to 232 lbs on discharge. She was able to ambulate in the hallway on room air maintaining her sats into the 90s without significant dyspnea. CTA on admission negative for PE. ECHO; normal Ef and with diastolic dysfunction. Sputum culture; growing few streptococcus, patient remain afebrile,  WBC normal, discussed with CCM would not start antibiotics at this time.  Scleroderma - Continue with Plaquenil, she is followed by Dr. [name]  HTN - she is no longer hypertensive and off of medications   Discharge Instructions   Allergies as of 01/07/2016   No Known Allergies     Medication List    STOP taking these medications   oxyCODONE 5 MG immediate release tablet Commonly known as:  Oxy IR/ROXICODONE     TAKE these medications   esomeprazole 40 MG capsule Commonly known as:  NEXIUM Take 30- 60 min before your first and last meals of the day What changed:  how much to take  how to take this  when to take this  additional instructions   furosemide 40 MG tablet Commonly known as:  LASIX Take 1 tablet (40 mg total) by mouth daily as needed for fluid or edema.   guaiFENesin-dextromethorphan 100-10 MG/5ML syrup Commonly known as:  ROBITUSSIN DM Take 5 mLs by mouth every 4 (four) hours as needed for cough (chest congestion).   hydroxychloroquine 200 MG tablet Commonly known as:  PLAQUENIL Take 200 mg by mouth 3 (three) times daily. Pt takes 2 tablets 400mg  in the morning and one tablet 200mg  at night   ibuprofen 600 MG tablet Commonly known as:  ADVIL,MOTRIN Take 1 tablet (600 mg total) by mouth every 6 (six) hours.   ipratropium-albuterol 0.5-2.5 (3) MG/3ML Soln Commonly known as:  DUONEB Take 3 mLs by nebulization 3 (three) times daily.   IRON PO Take 1 tablet by mouth daily.   PNV PO Take 1 tablet by mouth daily.   PROAIR HFA 108 (90 Base) MCG/ACT  inhaler Generic drug:  albuterol Inhale 2 puffs into the lungs every 4 (four) hours as needed for wheezing or shortness of breath.   valACYclovir 500 MG tablet Commonly known as:  VALTREX Take 500 mg by mouth daily.   Vitamin D 2000 units tablet Take 2,000 Units by mouth daily.            Durable Medical Equipment        Start     Ordered   01/07/16 1039  For home use only DME  Nebulizer/meds  Once    Question:  Patient needs a nebulizer to treat with the following condition  Answer:  ILD (interstitial lung disease) ([place])   01/07/16 [place] Follow up.   Specialty:  Radiology Contact information: [address] [id number] [place] [phone]       [facility] Follow up.   Specialty:  Radiology Contact information: [address] [id number] mc [place] [phone]       [facility] Follow up.   Specialty:  Cardiology Contact information: [address] [id number] Danice Goltz [place] [id number] [place], [place]. Schedule an appointment as soon as possible for a visit in 2 week(s).   Specialty:  Internal Medicine Contact information: [place] [address] [phone]          No Known Allergies  Consultations:  PCCM  Procedures/Studies:  2D echo  Study Conclusions - Left ventricle: The cavity size was normal. There was moderate concentric hypertrophy. Systolic function was normal. The estimated ejection fraction was in the range of 60% to 65%. Wall motion was normal; there were no regional wall motion abnormalities. Left ventricular diastolic function parameters were normal. - Mitral valve: There was trivial regurgitation. - Pulmonary arteries: Systolic pressure could not be accurately estimated. - Inferior vena cava: The vessel was dilated. The respirophasic diameter changes were blunted (< 50%), consistent with elevated central venous pressure.  Dg Chest 2 View  Result Date: 12/28/2015 CLINICAL DATA:  Fever chills and swollen lower extremities. Dyspnea. EXAM: CHEST  2 VIEW COMPARISON:  11/03/2012 FINDINGS: Patchy airspace opacity in the posterior left lower lobe may  represent pneumonia. This is superimposed on chronic fibrotic changes. No effusions. Hilar, mediastinal and cardiac contours are unremarkable. IMPRESSION: Left lower lobe consolidation, suspicious for pneumonia superimposed on the known fibrotic lung disease. Electronically Signed   By: Andreas Newport M.D.   On: 12/28/2015 21:32   Ct Angio Chest Pe W Or Wo Contrast  Result Date: 12/30/2015 CLINICAL DATA:  Shortness of breath. EXAM: CT ANGIOGRAPHY CHEST WITH CONTRAST TECHNIQUE: Multidetector CT imaging of the chest was performed using the standard protocol during bolus administration of intravenous contrast. Multiplanar CT image reconstructions and MIPs were obtained to evaluate the vascular anatomy. CONTRAST:  100 mL of Isovue 370 intravenously. COMPARISON:  CT scan of July 08, 2013. FINDINGS: Cardiovascular: There is no evidence of thoracic aortic dissection or aneurysm. There is no definite evidence of pulmonary embolus. Mediastinum/Nodes: No definite adenopathy is noted. Lungs/Pleura: Mild bilateral pleural effusions are noted, with left larger than right. Honeycombing of the lung bases is again noted as well as the upper lobes bilaterally, consistent with history of interstitial lung disease. However, there is interval development of patchy airspace opacity seen in the upper and  lower lobes bilaterally, concerning for multifocal pneumonia. Upper Abdomen: No acute abnormality. Musculoskeletal: No chest wall abnormality. No acute or significant osseous findings. Review of the MIP images confirms the above findings. IMPRESSION: No definite evidence of pulmonary embolus. Mild bilateral pleural effusions are noted, left larger than right. Honeycombing of lung parenchyma is seen in the lung bases and upper lobes bilaterally, consistent with history of interstitial lung disease. There is interval development patchy airspace opacities in the upper and lower lobes bilaterally, concerning for multifocal pneumonia.  Electronically Signed   By: Marijo Conception, M.D.   On: 12/30/2015 07:47   Dg Chest Port 1 View  Result Date: 01/07/2016 CLINICAL DATA:  Shortness of breath for 2 weeks. EXAM: PORTABLE CHEST 1 VIEW COMPARISON:  01/05/2016 FINDINGS: Low lung volumes. Heart is borderline in size, accentuated by the portable nature of the study and low volumes. Patchy left perihilar and bibasilar opacities are again noted, similar to prior study. No visible effusions. IMPRESSION: Patchy bilateral airspace opacities, unchanged.  Low lung volumes. Electronically Signed   By: Rolm Baptise M.D.   On: 01/07/2016 07:59   Dg Chest Port 1 View  Result Date: 01/05/2016 CLINICAL DATA:  Shortness of breath. EXAM: PORTABLE CHEST 1 VIEW COMPARISON:  Radiograph January 04, 2016. FINDINGS: The heart size and mediastinal contours are within normal limits. No pneumothorax or pleural effusion is noted. Stable bilateral perihilar and basilar opacities are noted concerning for edema or atypical inflammation. The visualized skeletal structures are unremarkable. IMPRESSION: Stable bilateral lung opacities as described above. Electronically Signed   By: Marijo Conception, M.D.   On: 01/05/2016 10:41   Dg Chest Port 1 View  Result Date: 01/04/2016 CLINICAL DATA:  Pulmonary edema. EXAM: PORTABLE CHEST 1 VIEW COMPARISON:  01/03/2016, 12/30/2015.  CT 01/30/2015. FINDINGS: Mediastinum unremarkable. Heart size normal. Partial clearing of bilateral multifocal pulmonary infiltrates/edema with persistent prominent infiltrates/ edema in the left mid and lower lung fields. Bibasilar atelectasis. Bronchiectatic changes again noted in the right lung base. No pleural effusion or pneumothorax. No acute bony abnormality . IMPRESSION: 1. Partial clearing of bilateral multifocal pulmonary infiltrates/edema with persistent prominent infiltrates/ edema in the left mid and lower lung fields. 2. Bibasilar atelectasis. Bronchiectatic changes again in the right lung  base. Electronically Signed   By: Marcello Moores  Register   On: 01/04/2016 06:32   Dg Chest Port 1 View  Result Date: 01/03/2016 CLINICAL DATA:  40 year old female persistent cough since last night. Initial encounter. EXAM: PORTABLE CHEST 1 VIEW COMPARISON:  01/02/2016 and earlier. FINDINGS: Portable AP semi upright view at at 1231 hours. Stable extensive areas of Patchy and confluent pulmonary opacity, mostly interstitial in nature. Continued low lung volumes. No superimposed pneumothorax or pleural effusion. Normal cardiac size and mediastinal contours. Visualized tracheal air column is within normal limits. IMPRESSION: Widespread bilateral pulmonary opacities suggestive of viral/atypical respiratory infection have not significantly changed. No new cardiopulmonary abnormality. Electronically Signed   By: Genevie Ann M.D.   On: 01/03/2016 12:43   Dg Chest Port 1 View  Result Date: 01/02/2016 CLINICAL DATA:  Acute respiratory failure. EXAM: PORTABLE CHEST 1 VIEW COMPARISON:  01/01/2016 and CT chest 12/30/2015. FINDINGS: Trachea is midline. Heart size stable. Lungs are low in volume with patchy bilateral airspace opacification, similar to yesterday's exam. No definite pleural fluid. Right hemidiaphragm is elevated. IMPRESSION: Patchy bilateral airspace opacification, unchanged from the prior exam, most indicative of pneumonia. Electronically Signed   By: Lorin Picket M.D.   On: 01/02/2016  07:09   Dg Chest Port 1 View  Result Date: 01/01/2016 CLINICAL DATA:  Acute respiratory failure. Cough and shortness of breath. EXAM: PORTABLE CHEST 1 VIEW COMPARISON:  December 9 1,017 FINDINGS: Interval worsening of the right-sided pulmonary infiltrate. Infiltrate in the left mid lower lung is stable. No other interval changes. IMPRESSION: Worsening multifocal infiltrate. Electronically Signed   By: Dorise Bullion III M.D   On: 01/01/2016 14:26   Dg Chest Port 1 View  Result Date: 12/31/2015 CLINICAL DATA:  Dyspnea.  EXAM: PORTABLE CHEST 1 VIEW COMPARISON:  12/30/2015 and chest CT 12/30/2015 FINDINGS: Lungs are somewhat hypoinflated with mild interval worsening of bilateral multifocal airspace opacification likely multifocal pneumonia. Mild stable cardiomegaly. Remainder of the exam is unchanged. IMPRESSION: Interval worsening of bilateral multifocal airspace process likely multifocal pneumonia. Stable cardiomegaly. Electronically Signed   By: Marin Olp M.D.   On: 12/31/2015 07:36   Dg Chest Port 1 View  Result Date: 12/30/2015 CLINICAL DATA:  Dyspnea. EXAM: PORTABLE CHEST 1 VIEW COMPARISON:  12/28/2015 FINDINGS: Worsening left lower lobe opacity and developing right perihilar opacity may represent multifocal pneumonia superimposed on the severe fibrosis. No other significant wall change. No large effusions IMPRESSION: Developing focal airspace consolidation, likely multifocal pneumonia. Electronically Signed   By: Andreas Newport M.D.   On: 12/30/2015 03:18   [place] Mfm Ob Follow Up  Result Date: 12/14/2015 OBSTETRICAL ULTRASOUND: This exam was performed within a [facility]. The OB US report was generated in the AS system, and faxed to the ordering physician.  This report is available in the [employer]. See the AS Obstetric US report via the Image Link.    Subjective: - no chest pain, shortness of breath, no abdominal pain, nausea or vomiting.   Discharge Exam: Vitals:   01/07/16 0546 01/07/16 1012  BP: 133/77   Pulse: 90 86  Resp: 17 18  Temp: 97.8 F (36.6 C)    Vitals:   01/06/16 2235 01/07/16 0500 01/07/16 0546 01/07/16 1012  BP: 136/81  133/77   Pulse: 95  90 86  Resp: 17  17 18   Temp: 98.3 F (36.8 C)  97.8 F (36.6 C)   TempSrc: Oral  Oral   SpO2: 99%  96% 99%  Weight:  105.2 kg (232 lb)    Height:        General: Pt is alert, awake, not in acute distress Cardiovascular: RRR, S1/S2 +, no rubs, no gallops Respiratory: CTA bilaterally, no wheezing, no  rhonchi Abdominal: Soft, NT, ND, bowel sounds + Extremities: no edema, no cyanosis    The results of significant diagnostics from this hospitalization (including imaging, microbiology, ancillary and laboratory) are listed below for reference.     Microbiology: Recent Results (from the past 240 hour(s))  Culture, blood (routine x 2) Call MD if unable to obtain prior to antibiotics being given     Status: None   Collection Time: 12/29/15  1:17 AM  Result Value Ref Range Status   Specimen Description BLOOD RIGHT ARM  Final   Special Requests BOTTLES DRAWN AEROBIC AND ANAEROBIC 5CC  Final   Culture   Final    NO GROWTH 5 DAYS Performed at [facility]    Report Status 01/03/2016 FINAL  Final  Culture, blood (routine x 2) Call MD if unable to obtain prior to antibiotics being given     Status: None   Collection Time: 12/29/15  1:21 AM  Result Value Ref Range Status  Specimen Description BLOOD RIGHT HAND  Final   Special Requests BOTTLES DRAWN AEROBIC AND ANAEROBIC 5CC  Final   Culture   Final    NO GROWTH 5 DAYS Performed at [facility]    Report Status 01/03/2016 FINAL  Final  Culture, respiratory (NON-Expectorated)     Status: None   Collection Time: 12/29/15 10:20 PM  Result Value Ref Range Status   Specimen Description SPUTUM  Final   Special Requests Normal  Final   Gram Stain   Final    RARE WBC PRESENT, PREDOMINANTLY PMN FEW SQUAMOUS EPITHELIAL CELLS PRESENT NO ORGANISMS SEEN Performed at [place] [facility]  Final   Report Status 01/01/2016 FINAL  Final  MRSA PCR Screening     Status: None   Collection Time: 12/30/15  9:57 AM  Result Value Ref Range Status   MRSA by PCR NEGATIVE NEGATIVE Final    Comment:        The GeneXpert MRSA Assay (FDA approved for NASAL specimens only), is one component of a comprehensive MRSA colonization surveillance program. It is not intended to diagnose MRSA infection nor to  guide or monitor treatment for MRSA infections.      Labs: BNP (last 3 results)  Recent Labs  12/31/15 1927  BNP [id number]*   Basic Metabolic Panel:  Recent Labs Lab 01/01/16 0442 01/02/16 0408 01/03/16 0213 01/04/16 0205 01/05/16 0340 01/06/16 0353 01/07/16 0448  NA 138 137 137 139 140 136 139  K 3.4* 3.9 3.5 4.5 3.8 3.8 3.5  CL 98* 97* 98* 101 99* 97* 101  CO2 32 27 27 25 28 28 25   GLUCOSE 103* 107* 108* 107* 88 95 97  BUN 9 12 16  23* 26* 22* 18  CREATININE 0.87 0.91 0.97 0.98 1.34* 0.91 0.98  CALCIUM 8.3* 9.2 9.2 9.5 9.3 9.4 9.5  MG 1.9 2.3  --   --   --   --   --   PHOS 5.6* 4.9*  --   --   --   --   --    Liver Function Tests: No results for input(s): AST, ALT, ALKPHOS, BILITOT, PROT, ALBUMIN in the last 168 hours. No results for input(s): LIPASE, AMYLASE in the last 168 hours. No results for input(s): AMMONIA in the last 168 hours. CBC:  Recent Labs Lab 01/01/16 0442 01/02/16 0408 01/03/16 0213 01/04/16 0205  WBC 6.5 8.1 7.4 6.6  NEUTROABS 4.4 6.0 4.8 4.1  HGB 9.6* 11.6* 12.0 12.2  HCT 30.3* 36.5 37.9 38.4  MCV 74.3* 73.4* 73.9* 73.4*  PLT 224 305 316 360   Cardiac Enzymes: No results for input(s): CKTOTAL, CKMB, CKMBINDEX, TROPONINI in the last 168 hours. BNP: Invalid input(s): POCBNP CBG: No results for input(s): GLUCAP in the last 168 hours. D-Dimer No results for input(s): DDIMER in the last 72 hours. Hgb A1c No results for input(s): HGBA1C in the last 72 hours. Lipid Profile No results for input(s): CHOL, HDL, LDLCALC, TRIG, CHOLHDL, LDLDIRECT in the last 72 hours. Thyroid function studies No results for input(s): TSH, T4TOTAL, T3FREE, THYROIDAB in the last 72 hours.  Invalid input(s): FREET3 Anemia work up No results for input(s): VITAMINB12, FOLATE, FERRITIN, TIBC, IRON, RETICCTPCT in the last 72 hours. Urinalysis    Component Value Date/Time   COLORURINE STRAW (A) 12/28/2015 1915   APPEARANCEUR CLEAR 12/28/2015 1915   LABSPEC  1.005 12/28/2015 1915   PHURINE 7.0 12/28/2015 1915   GLUCOSEU NEGATIVE 12/28/2015  [place] (A) 12/28/2015 1915   BILIRUBINUR NEGATIVE 12/28/2015 [place] 12/28/2015 1915   PROTEINUR NEGATIVE 12/28/2015 1915   NITRITE NEGATIVE 12/28/2015 1915   LEUKOCYTESUR MODERATE (A) 12/28/2015 1915   Sepsis Labs Invalid input(s): PROCALCITONIN,  WBC,  LACTICIDVEN Microbiology Recent Results (from the past 240 hour(s))  Culture, blood (routine x 2) Call MD if unable to obtain prior to antibiotics being given     Status: None   Collection Time: 12/29/15  1:17 AM  Result Value Ref Range Status   Specimen Description BLOOD RIGHT ARM  Final   Special Requests BOTTLES DRAWN AEROBIC AND ANAEROBIC 5CC  Final   Culture   Final    NO GROWTH 5 DAYS Performed at [facility]    Report Status 01/03/2016 FINAL  Final  Culture, blood (routine x 2) Call MD if unable to obtain prior to antibiotics being given     Status: None   Collection Time: 12/29/15  1:21 AM  Result Value Ref Range Status   Specimen Description BLOOD RIGHT HAND  Final   Special Requests BOTTLES DRAWN AEROBIC AND ANAEROBIC 5CC  Final   Culture   Final    NO GROWTH 5 DAYS Performed at [facility]    Report Status 01/03/2016 FINAL  Final  Culture, respiratory (NON-Expectorated)     Status: None   Collection Time: 12/29/15 10:20 PM  Result Value Ref Range Status   Specimen Description SPUTUM  Final   Special Requests Normal  Final   Gram Stain   Final    RARE WBC PRESENT, PREDOMINANTLY PMN FEW SQUAMOUS EPITHELIAL CELLS PRESENT NO ORGANISMS SEEN Performed at [place] [facility]  Final   Report Status 01/01/2016 FINAL  Final  MRSA PCR Screening     Status: None   Collection Time: 12/30/15  9:57 AM  Result Value Ref Range Status   MRSA by PCR NEGATIVE NEGATIVE Final    Comment:        The GeneXpert MRSA Assay (FDA approved for NASAL specimens only),  is one component of a comprehensive MRSA colonization surveillance program. It is not intended to diagnose MRSA infection nor to guide or monitor treatment for MRSA infections.      Time coordinating discharge: Over 30 minutes  SIGNED:  Marzetta Board, MD  [facility] 01/07/2016, 10:42 AM Pager [phone]  If 7PM-7AM, please contact night-coverage [email or web address] Password [email or web address]

## 2016-01-09 DIAGNOSIS — R69 Illness, unspecified: Secondary | ICD-10-CM | POA: Diagnosis not present

## 2016-02-02 ENCOUNTER — Other Ambulatory Visit (HOSPITAL_COMMUNITY): Payer: Self-pay | Admitting: Respiratory Therapy

## 2016-02-02 ENCOUNTER — Other Ambulatory Visit (HOSPITAL_COMMUNITY): Payer: Self-pay | Admitting: Internal Medicine

## 2016-02-02 DIAGNOSIS — J84178 Other interstitial pulmonary diseases with fibrosis in diseases classified elsewhere: Secondary | ICD-10-CM

## 2016-02-02 DIAGNOSIS — J8417 Other interstitial pulmonary diseases with fibrosis in diseases classified elsewhere: Principal | ICD-10-CM

## 2016-02-02 DIAGNOSIS — J8410 Pulmonary fibrosis, unspecified: Secondary | ICD-10-CM

## 2016-02-06 DIAGNOSIS — Z3009 Encounter for other general counseling and advice on contraception: Secondary | ICD-10-CM | POA: Diagnosis not present

## 2016-02-06 DIAGNOSIS — Z98891 History of uterine scar from previous surgery: Secondary | ICD-10-CM | POA: Diagnosis not present

## 2016-02-07 ENCOUNTER — Other Ambulatory Visit (HOSPITAL_COMMUNITY): Payer: Medicare HMO

## 2016-02-07 ENCOUNTER — Ambulatory Visit (HOSPITAL_COMMUNITY): Payer: Medicare HMO

## 2016-02-07 DIAGNOSIS — H04123 Dry eye syndrome of bilateral lacrimal glands: Secondary | ICD-10-CM | POA: Diagnosis not present

## 2016-02-07 DIAGNOSIS — M3500 Sicca syndrome, unspecified: Secondary | ICD-10-CM | POA: Diagnosis not present

## 2016-02-07 DIAGNOSIS — H5213 Myopia, bilateral: Secondary | ICD-10-CM | POA: Diagnosis not present

## 2016-02-07 DIAGNOSIS — Z79899 Other long term (current) drug therapy: Secondary | ICD-10-CM | POA: Diagnosis not present

## 2016-02-07 DIAGNOSIS — H40013 Open angle with borderline findings, low risk, bilateral: Secondary | ICD-10-CM | POA: Diagnosis not present

## 2016-02-08 ENCOUNTER — Ambulatory Visit (HOSPITAL_COMMUNITY): Payer: Medicare HMO

## 2016-02-09 ENCOUNTER — Ambulatory Visit (HOSPITAL_COMMUNITY)
Admission: RE | Admit: 2016-02-09 | Discharge: 2016-02-09 | Disposition: A | Discharge status: Home / Self Care | Payer: Medicare HMO | Source: Ambulatory Visit | Attending: Cardiology | Admitting: Cardiology

## 2016-02-09 ENCOUNTER — Other Ambulatory Visit: Payer: Self-pay | Admitting: Cardiology

## 2016-02-09 ENCOUNTER — Encounter (HOSPITAL_COMMUNITY): Payer: Medicare HMO

## 2016-02-09 ENCOUNTER — Ambulatory Visit (HOSPITAL_COMMUNITY)
Admission: RE | Admit: 2016-02-09 | Discharge: 2016-02-09 | Disposition: A | Discharge status: Home / Self Care | Payer: Medicare HMO | Source: Ambulatory Visit | Attending: Internal Medicine | Admitting: Internal Medicine

## 2016-02-09 DIAGNOSIS — I340 Nonrheumatic mitral (valve) insufficiency: Secondary | ICD-10-CM | POA: Insufficient documentation

## 2016-02-09 DIAGNOSIS — R0602 Shortness of breath: Secondary | ICD-10-CM

## 2016-02-09 NOTE — Progress Notes (Signed)
Echocardiogram 2D Echocardiogram has been performed.  Darcella Shiffman L Androw 02/09/2016, 3:42 PM

## 2016-02-10 ENCOUNTER — Ambulatory Visit (HOSPITAL_COMMUNITY)
Admission: RE | Admit: 2016-02-10 | Discharge: 2016-02-10 | Disposition: A | Discharge status: Home / Self Care | Payer: Medicare HMO | Source: Ambulatory Visit | Attending: Internal Medicine | Admitting: Internal Medicine

## 2016-02-10 DIAGNOSIS — J8410 Pulmonary fibrosis, unspecified: Secondary | ICD-10-CM

## 2016-02-10 DIAGNOSIS — R918 Other nonspecific abnormal finding of lung field: Secondary | ICD-10-CM | POA: Insufficient documentation

## 2016-02-10 DIAGNOSIS — J479 Bronchiectasis, uncomplicated: Secondary | ICD-10-CM | POA: Diagnosis not present

## 2016-02-10 DIAGNOSIS — M349 Systemic sclerosis, unspecified: Secondary | ICD-10-CM | POA: Insufficient documentation

## 2016-02-10 DIAGNOSIS — K8020 Calculus of gallbladder without cholecystitis without obstruction: Secondary | ICD-10-CM | POA: Diagnosis not present

## 2016-02-10 DIAGNOSIS — J90 Pleural effusion, not elsewhere classified: Secondary | ICD-10-CM | POA: Diagnosis not present

## 2016-02-10 DIAGNOSIS — K5730 Diverticulosis of large intestine without perforation or abscess without bleeding: Secondary | ICD-10-CM | POA: Diagnosis not present

## 2016-02-15 DIAGNOSIS — E669 Obesity, unspecified: Secondary | ICD-10-CM | POA: Diagnosis not present

## 2016-02-15 DIAGNOSIS — R0602 Shortness of breath: Secondary | ICD-10-CM | POA: Diagnosis not present

## 2016-02-15 DIAGNOSIS — Z6830 Body mass index (BMI) 30.0-30.9, adult: Secondary | ICD-10-CM | POA: Diagnosis not present

## 2016-02-15 DIAGNOSIS — M3489 Other systemic sclerosis: Secondary | ICD-10-CM | POA: Diagnosis not present

## 2016-02-15 DIAGNOSIS — K219 Gastro-esophageal reflux disease without esophagitis: Secondary | ICD-10-CM | POA: Diagnosis not present

## 2016-02-15 DIAGNOSIS — Z0000 Encounter for general adult medical examination without abnormal findings: Secondary | ICD-10-CM | POA: Diagnosis not present

## 2016-02-15 DIAGNOSIS — R69 Illness, unspecified: Secondary | ICD-10-CM | POA: Diagnosis not present

## 2016-02-27 DIAGNOSIS — Z3202 Encounter for pregnancy test, result negative: Secondary | ICD-10-CM | POA: Diagnosis not present

## 2016-02-27 DIAGNOSIS — Z30430 Encounter for insertion of intrauterine contraceptive device: Secondary | ICD-10-CM | POA: Diagnosis not present

## 2016-03-30 ENCOUNTER — Telehealth: Payer: Self-pay | Admitting: *Deleted

## 2016-03-30 DIAGNOSIS — D509 Iron deficiency anemia, unspecified: Secondary | ICD-10-CM | POA: Diagnosis not present

## 2016-03-30 DIAGNOSIS — R350 Frequency of micturition: Secondary | ICD-10-CM | POA: Diagnosis not present

## 2016-03-30 DIAGNOSIS — G43909 Migraine, unspecified, not intractable, without status migrainosus: Secondary | ICD-10-CM | POA: Diagnosis not present

## 2016-03-30 DIAGNOSIS — K219 Gastro-esophageal reflux disease without esophagitis: Secondary | ICD-10-CM | POA: Diagnosis not present

## 2016-03-30 DIAGNOSIS — Z0000 Encounter for general adult medical examination without abnormal findings: Secondary | ICD-10-CM | POA: Diagnosis not present

## 2016-03-30 DIAGNOSIS — R635 Abnormal weight gain: Secondary | ICD-10-CM | POA: Diagnosis not present

## 2016-03-30 DIAGNOSIS — R0602 Shortness of breath: Secondary | ICD-10-CM | POA: Diagnosis not present

## 2016-03-30 DIAGNOSIS — R631 Polydipsia: Secondary | ICD-10-CM | POA: Diagnosis not present

## 2016-03-30 DIAGNOSIS — R6889 Other general symptoms and signs: Secondary | ICD-10-CM | POA: Diagnosis not present

## 2016-03-30 DIAGNOSIS — R079 Chest pain, unspecified: Secondary | ICD-10-CM | POA: Diagnosis not present

## 2016-03-30 NOTE — Telephone Encounter (Signed)
NOTES SENT TO SCHEDULING.  °

## 2016-04-09 DIAGNOSIS — L639 Alopecia areata, unspecified: Secondary | ICD-10-CM | POA: Diagnosis not present

## 2016-04-09 DIAGNOSIS — O0281 Inappropriate change in quantitative human chorionic gonadotropin (hCG) in early pregnancy: Secondary | ICD-10-CM | POA: Diagnosis not present

## 2016-04-09 DIAGNOSIS — E538 Deficiency of other specified B group vitamins: Secondary | ICD-10-CM | POA: Diagnosis not present

## 2016-04-09 DIAGNOSIS — J8417 Other interstitial pulmonary diseases with fibrosis in diseases classified elsewhere: Secondary | ICD-10-CM | POA: Diagnosis not present

## 2016-04-09 DIAGNOSIS — J8489 Other specified interstitial pulmonary diseases: Secondary | ICD-10-CM | POA: Diagnosis not present

## 2016-04-09 DIAGNOSIS — I509 Heart failure, unspecified: Secondary | ICD-10-CM | POA: Diagnosis not present

## 2016-04-09 DIAGNOSIS — E139 Other specified diabetes mellitus without complications: Secondary | ICD-10-CM | POA: Diagnosis not present

## 2016-04-09 DIAGNOSIS — D508 Other iron deficiency anemias: Secondary | ICD-10-CM | POA: Diagnosis not present

## 2016-04-09 DIAGNOSIS — M940 Chondrocostal junction syndrome [Tietze]: Secondary | ICD-10-CM | POA: Diagnosis not present

## 2016-04-09 DIAGNOSIS — M3489 Other systemic sclerosis: Secondary | ICD-10-CM | POA: Diagnosis not present

## 2016-04-10 DIAGNOSIS — Z30431 Encounter for routine checking of intrauterine contraceptive device: Secondary | ICD-10-CM | POA: Diagnosis not present

## 2016-04-12 DIAGNOSIS — L720 Epidermal cyst: Secondary | ICD-10-CM | POA: Diagnosis not present

## 2016-04-12 DIAGNOSIS — M3489 Other systemic sclerosis: Secondary | ICD-10-CM | POA: Diagnosis not present

## 2016-04-18 ENCOUNTER — Ambulatory Visit (INDEPENDENT_AMBULATORY_CARE_PROVIDER_SITE_OTHER): Payer: Medicare HMO | Admitting: Internal Medicine

## 2016-04-18 ENCOUNTER — Encounter: Payer: Self-pay | Admitting: Internal Medicine

## 2016-04-18 VITALS — BP 130/78 | HR 87 | Ht 72.0 in | Wt 236.8 lb

## 2016-04-18 DIAGNOSIS — M349 Systemic sclerosis, unspecified: Secondary | ICD-10-CM

## 2016-04-18 DIAGNOSIS — J4530 Mild persistent asthma, uncomplicated: Secondary | ICD-10-CM | POA: Diagnosis not present

## 2016-04-18 MED ORDER — BUDESONIDE-FORMOTEROL FUMARATE 80-4.5 MCG/ACT IN AERO: 2.0000 | INHALATION_SPRAY | Freq: Two times a day (BID) | RESPIRATORY_TRACT | 5 refills | Status: DC

## 2016-04-18 MED ORDER — BUDESONIDE-FORMOTEROL FUMARATE 80-4.5 MCG/ACT IN AERO
2.0000 | INHALATION_SPRAY | Freq: Two times a day (BID) | RESPIRATORY_TRACT | 5 refills | Status: DC
Start: 2016-04-18 — End: 2016-07-20

## 2016-04-18 MED ORDER — BUDESONIDE-FORMOTEROL FUMARATE 80-4.5 MCG/ACT IN AERO: 2.0000 | INHALATION_SPRAY | Freq: Two times a day (BID) | RESPIRATORY_TRACT | 0 refills | Status: DC

## 2016-04-18 NOTE — Patient Instructions (Addendum)
Plan A = Automatic = symbicort 80 Take 2 puffs first thing in am and then another 2 puffs about 12 hours later.     Work on inhaler technique:  relax and gently blow all the way out then take a nice smooth deep breath back in, triggering the inhaler at same time you start breathing in.  Hold for up to 5 seconds if you can. Blow out thru nose. Rinse and gargle with water when done     Plan B = Backup Only use your albuterol as a rescue medication to be used if you can't catch your breath by resting or doing a relaxed purse lip breathing pattern.  - The less you use it, the better it will work when you need it. - Ok to use the inhaler up to 2 puffs  every 4 hours if you must but call for appointment if use goes up over your usual need - Don't leave home without it !!  (think of it like the spare tire for your car)   Plan C = Crisis - only use your albuterol nebulizer if you first try Plan B and it fails to help > ok to use the nebulizer up to every 4 hours but if start needing it regularly call for immediate appointment    Please schedule a follow up visit in 3 months but call sooner if needed with PFT on return

## 2016-04-18 NOTE — Assessment & Plan Note (Addendum)
-   f/u Dr Chilton Greathouse  - HRCT 07/08/13 1. Marked progression of interstitial lung disease. Given the strong craniocaudal gradient, marked progression compared to prior study from 08/03/2004, and presence of honeycombing in the lower lobes of the lungs bilaterally, this pattern is compatible with usual interstitial pneumonia (UIP), presumably a manifestation of the patient's underlying scleroderma. 2. Dilated pulmonic trunk (4 cm in diameter), suggestive of pulmonary arterial hypertension. 3. Dilated esophagus presumably secondary to scleroderma. - Echo 01/28/15 s PH - 06/22/2015  Walked RA x 3 laps @ 185 ft each stopped due to end of study, nl pace, no significant desat or sob.  - symptoms improved on gerd rx 07/21/2015 despite additional month of gestation - CT w/o contrast  Chest  02/10/16  1. Near complete resolution of the extensive patchy areas of ground-glass attenuation seen in both lungs on the 12/30/2015 chest CT, most consistent with nearly resolved pulmonary edema. 2. Trace residual left pleural effusion is decreased. 3. Stable top-normal heart size. Stable chronically dilated main pulmonary artery, likely indicating chronic pulmonary arterial hypertension. 4. Basilar predominant fibrotic interstitial lung disease with honeycombing, mildly progressed since 07/08/2013 high-resolution chest CT study, compatible with usual interstitial pneumonia (UIP) due to scleroderma. - Echo 02/09/16 no PAH   She clearly had ALI from pre-eclampsia that has resolved, not scleroderma lung, with her problems now (residual cough) more related to airways (see separate a/p)  And fortunately no PAH or progressive ILD from scleroderma   Will see her back in 3 months for full pfts

## 2016-04-18 NOTE — Progress Notes (Signed)
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 Asthma     History of Present Illness  06/22/2015 1st [facility] office visit/ [name]  @ [redacted] weeks gestation on ppi bid but not ac and saba but no longer ICS  Chief Complaint  Patient presents with  . Advice Only    Referred by [name]; Shortness of breath, thinks she may have asthma.   for long as she can remember when cold gets in chest gets severe symptoms of bad cough by codeine goes away eventually recurred in Dec 2016 > UC with cough/ sob / lost voice dx as asthma rx pred/proair and 100% better an no maint then March 2017 same thing  > UC eval rx one treatment and this resolved but started needing saba more but mostly just with exertion maybe once a day and never noct/ or early. Also loses breath talking.  saba helps  At baseline sob walking entire grocery store, never tried the saba first/ avg saba once daily Since onset IUP = slt more sob/ no increase in saba use  rec Pace yourself and walk slower to avoid becoming too short of breath as this is not healthy for oxygen delivery to the baby's oxygen level Only use your albuterol as a rescue medication Change nexium to Take 30- 60 min before your first and last meals of the day  GERD diet     07/21/2015  f/u ov/[name] re:   IUP 16 weeks / mild asthma/ no saba need since last ov on gerd rx  Chief Complaint  Patient presents with  . Follow-up  Has learned to pace herself and no long sob rec No change in recommendations    Nov 29th 2017 c section then 3 days later swelling / hbp / sob > [facility]    Admit  date: 12/28/2015 Discharge date: 01/07/2016   HPI: per [name], [name] (patient initially admitted to [facility] hospital then transferred to [facility] ICU) [id number], 1 week s/p repeat CS here with multiple sx. She reports worsening LE edema about 3 days ago after she was discharged from the hospital. She reports onset of SOB last night. She then had onset of dizziness and HA today. She took Percocet and had no relief. She also reports upper abdominal pain bilateral that radiates to her back. Review of chart indicates pregnancy and postpartum course was uncomplicated.  Hospital Course: Discharge Diagnoses:  Active Problems:   Preeclampsia in postpartum period   Acute pulmonary edema (HCC)   Acute respiratory failure with hypoxia (HCC)   Dyspnea   SOB (shortness of breath)  Acute Hypoxic Respiratory Failure - patient admitted to [facility] hospital with hypoxic respiratory fialure due to pulmonary edema in the setting of pre-eclampsia on underlying ILD from scleroderma. She eventually was transferred to [facility] for critical care assistance. She is s/p C section on 11/29. She was aggressively diuresed and her hypoxia resolved and was weaned off to room air. She is net negative 20L and her weight has improved from 260 lbs on admission to  232 lbs on discharge. She was able to ambulate in the hallway on room air maintaining her sats into the 90s without significant dyspnea. CTA on admission negative for PE. ECHO; normal Ef and with diastolic dysfunction. Sputum culture; growing few streptococcus, patient remain afebrile, WBC normal, discussed with CCM would not start antibiotics at this time.  Scleroderma - Continue with Plaquenil, she is followed by Dr. [name]  HTN - she is no longer hypertensive and off of medications    04/18/2016  f/u ov/[name] re: transiition oc care p admit/  Chronic cough on nexium bid ac in pt with scleroderma Chief Complaint  Patient presents with  .  Follow-up    Follow up per [facility] for scleroderma, Pt here today c/o increase sob, finding it hard to catch her breath, has a non productive coug, slight wheezing at times, Denies fever  Stayed in bed x 8 weeks p [facility] and now 85% better    Using saba three times a week and no neb need at all  Cough/wheeze worse p exertion / not noct   Has not tried inhaler for cough  Doe = MMRC2 = can't walk a nl pace on a flat grade s sob but does fine slow and flat eg shopping  No obvious day to day or daytime variability or assoc excess/ purulent sputum or mucus plugs or hemoptysis or cp or chest tightness,  or overt sinus or hb symptoms. No unusual exp hx or h/o childhood pna/ asthma or knowledge of premature birth.  Sleeping ok without nocturnal  or early am exacerbation  of respiratory  c/o's or need for noct saba. Also denies any obvious fluctuation of symptoms with weather or environmental changes or other aggravating or alleviating factors except as outlined above   Current Medications, Allergies, Complete Past Medical History, Past Surgical History, Family History, and Social History were reviewed in [employer] record.  ROS  The following are not active complaints unless bolded sore throat, dysphagia, dental problems, itching, sneezing,  nasal congestion or excess/ purulent secretions, ear ache,   fever, chills, sweats, unintended wt loss, classically pleuritic or exertional cp,  orthopnea pnd or leg swelling, presyncope, palpitations, abdominal pain, anorexia, nausea, vomiting, diarrhea  or change in bowel or bladder habits, change in stools or urine, dysuria,hematuria,  rash, arthralgias, visual complaints, headache, numbness, weakness or ataxia or problems with walking or coordination,  change in mood/affect or memory.                Objective:   Physical Exam  amb bf typical scleroderma facies   04/18/2016      236   06/22/15 268 lb 6.4 oz  (121.745 kg)  06/24/14 250 lb (113.399 kg)  11/03/12 243 lb (110.224 kg)    Vital signs reviewed .- Note on arrival 02 sats  97% on  RA     HEENT: nl dentition, turbinates, and oropharynx. Nl external ear canals without cough reflex   NECK :  without JVD/Nodes/TM/ nl carotid upstrokes bilaterally   LUNGS: no acc muscle use,  Nl contour chest with minimal insp crackles in bases,  Cough sometimes late exp   CV:  RRR  no s3 or murmur or increase in P2, no edema   ABD:  soft and nontender with nl inspiratory excursion in the supine position. No bruits or organomegaly, bowel sounds nl  MS:  Nl gait/ ext warm without deformities, calf tenderness, cyanosis or clubbing No obvious  joint restrictions   SKIN: warm and dry without lesions    NEURO:  alert, approp, nl sensorium with  no motor deficits      I personally reviewed images and agree with radiology impression as follows:  CT w/o contrast  Chest  02/10/16  1. Near complete resolution of the extensive patchy areas of ground-glass attenuation seen in both lungs on the 12/30/2015 chest CT, most consistent with nearly resolved pulmonary edema. 2. Trace residual left pleural effusion is decreased. 3. Stable top-normal heart size. Stable chronically dilated main pulmonary artery, likely indicating chronic pulmonary arterial hypertension. 4. Basilar predominant fibrotic interstitial lung disease with honeycombing, mildly progressed since 07/08/2013 high-resolution chest CT study, compatible with usual interstitial pneumonia (UIP) due to scleroderma. 5. Innumerable subcentimeter pulmonary nodules throughout both lungs, some of which are new or increased mildly in size since 07/08/2013, measuring up to 8 mm       Assessment & Plan:

## 2016-04-19 ENCOUNTER — Encounter: Payer: Self-pay | Admitting: Internal Medicine

## 2016-04-19 NOTE — Assessment & Plan Note (Signed)
Body mass index is 32.12 kg/m.  trending down, encouraged No results found for: TSH   Contributing to gerd risk/ doe/reviewed the need and the process to achieve and maintain neg calorie balance > defer f/u primary care including intermittently monitoring thyroid status

## 2016-04-19 NOTE — Assessment & Plan Note (Signed)
PFT's  01/28/15    FEV1 2.02 (60 % ) ratio 65   with DLCO  47 % corrects to 68 % for alv volume - 04/18/2016  After extensive coaching HFA effectiveness =    75% > try symb 80 2bid    I believe the airway component, whether related to es dysfunction or not, is her primary pulmonary problem now so will start laba/ics and bring back for full pfts to sort out   I had an extended discussion with the patient reviewing all relevant studies (including prolonged admit notes)  completed to date and  lasting 25 minutes of a 40  minute transition of care  visit addressing new   non-specific but potentially very serious refractory respiratory symptoms of unknown etiology.  Each maintenance medication was reviewed in detail including most importantly the difference between maintenance and prns and under what circumstances the prns are to be triggered using an action plan format that is not reflected in the computer generated alphabetically organized AVS.    Please see AVS for specific instructions unique to this office visit that I personally wrote and verbalized to the the pt in detail and then reviewed with pt  by my nurse highlighting any changes in therapy/plan of care  recommended at today's visit.

## 2016-05-18 DIAGNOSIS — Z0100 Encounter for examination of eyes and vision without abnormal findings: Secondary | ICD-10-CM | POA: Diagnosis not present

## 2016-05-18 LAB — PULMONARY FUNCTION TEST
DL/VA % pred: 63 %
DL/VA: 3.57 ml/min/mmHg/L
DLCO UNC % PRED: 35 %
DLCO UNC: 12.4 ml/min/mmHg
DLCO cor % pred: 36 %
DLCO cor: 12.91 ml/min/mmHg
FEF 25-75 Pre: 1.51 L/sec
FEF2575-%Pred-Pre: 43 %
FEV1-%Pred-Pre: 58 %
FEV1-Pre: 1.93 L
FEV1FVC-%PRED-PRE: 90 %
FEV6-%PRED-PRE: 63 %
FEV6-Pre: 2.54 L
FEV6FVC-%PRED-PRE: 102 %
FVC-%PRED-PRE: 62 %
FVC-PRE: 2.54 L
PRE FEV1/FVC RATIO: 76 %
Pre FEV6/FVC Ratio: 100 %
RV % pred: 72 %
RV: 1.4 L
TLC % PRED: 64 %
TLC: 4.03 L

## 2016-05-28 DIAGNOSIS — N390 Urinary tract infection, site not specified: Secondary | ICD-10-CM | POA: Diagnosis not present

## 2016-05-28 DIAGNOSIS — R300 Dysuria: Secondary | ICD-10-CM | POA: Diagnosis not present

## 2016-05-29 ENCOUNTER — Encounter: Payer: Self-pay | Admitting: Cardiovascular Disease

## 2016-06-15 ENCOUNTER — Ambulatory Visit: Payer: Medicare HMO | Admitting: Cardiovascular Disease

## 2016-07-02 DIAGNOSIS — E538 Deficiency of other specified B group vitamins: Secondary | ICD-10-CM | POA: Diagnosis not present

## 2016-07-02 DIAGNOSIS — I270 Primary pulmonary hypertension: Secondary | ICD-10-CM | POA: Diagnosis not present

## 2016-07-02 DIAGNOSIS — L639 Alopecia areata, unspecified: Secondary | ICD-10-CM | POA: Diagnosis not present

## 2016-07-02 DIAGNOSIS — I509 Heart failure, unspecified: Secondary | ICD-10-CM | POA: Diagnosis not present

## 2016-07-02 DIAGNOSIS — J8489 Other specified interstitial pulmonary diseases: Secondary | ICD-10-CM | POA: Diagnosis not present

## 2016-07-02 DIAGNOSIS — M940 Chondrocostal junction syndrome [Tietze]: Secondary | ICD-10-CM | POA: Diagnosis not present

## 2016-07-02 DIAGNOSIS — D508 Other iron deficiency anemias: Secondary | ICD-10-CM | POA: Diagnosis not present

## 2016-07-02 DIAGNOSIS — J8417 Other interstitial pulmonary diseases with fibrosis in diseases classified elsewhere: Secondary | ICD-10-CM | POA: Diagnosis not present

## 2016-07-02 DIAGNOSIS — M3489 Other systemic sclerosis: Secondary | ICD-10-CM | POA: Diagnosis not present

## 2016-07-02 DIAGNOSIS — O0281 Inappropriate change in quantitative human chorionic gonadotropin (hCG) in early pregnancy: Secondary | ICD-10-CM | POA: Diagnosis not present

## 2016-07-02 DIAGNOSIS — E139 Other specified diabetes mellitus without complications: Secondary | ICD-10-CM | POA: Diagnosis not present

## 2016-07-10 DIAGNOSIS — I2721 Secondary pulmonary arterial hypertension: Secondary | ICD-10-CM | POA: Insufficient documentation

## 2016-07-16 DIAGNOSIS — R69 Illness, unspecified: Secondary | ICD-10-CM | POA: Diagnosis not present

## 2016-07-17 DIAGNOSIS — R0602 Shortness of breath: Secondary | ICD-10-CM | POA: Diagnosis not present

## 2016-07-17 DIAGNOSIS — J45909 Unspecified asthma, uncomplicated: Secondary | ICD-10-CM | POA: Diagnosis not present

## 2016-07-19 ENCOUNTER — Other Ambulatory Visit: Payer: Self-pay | Admitting: Internal Medicine

## 2016-07-19 DIAGNOSIS — R0602 Shortness of breath: Secondary | ICD-10-CM | POA: Diagnosis not present

## 2016-07-19 DIAGNOSIS — R0600 Dyspnea, unspecified: Secondary | ICD-10-CM

## 2016-07-20 ENCOUNTER — Encounter: Payer: Self-pay | Admitting: Internal Medicine

## 2016-07-20 ENCOUNTER — Ambulatory Visit (INDEPENDENT_AMBULATORY_CARE_PROVIDER_SITE_OTHER): Payer: Medicare HMO | Admitting: Internal Medicine

## 2016-07-20 ENCOUNTER — Telehealth: Payer: Self-pay | Admitting: Internal Medicine

## 2016-07-20 VITALS — BP 112/70 | HR 79 | Ht 72.0 in | Wt 236.0 lb

## 2016-07-20 DIAGNOSIS — R0600 Dyspnea, unspecified: Secondary | ICD-10-CM | POA: Diagnosis not present

## 2016-07-20 DIAGNOSIS — J4530 Mild persistent asthma, uncomplicated: Secondary | ICD-10-CM | POA: Diagnosis not present

## 2016-07-20 DIAGNOSIS — M349 Systemic sclerosis, unspecified: Secondary | ICD-10-CM

## 2016-07-20 LAB — PULMONARY FUNCTION TEST
DL/VA % pred: 67 %
DL/VA: 3.76 ml/min/mmHg/L
DLCO unc % pred: 42 %
DLCO unc: 15.01 ml/min/mmHg
FEF 25-75 Post: 1.57 L/sec
FEF 25-75 Pre: 1.2 L/sec
FEF2575-%CHANGE-POST: 30 %
FEF2575-%PRED-PRE: 34 %
FEF2575-%Pred-Post: 45 %
FEV1-%Change-Post: 9 %
FEV1-%PRED-PRE: 52 %
FEV1-%Pred-Post: 57 %
FEV1-PRE: 1.74 L
FEV1-Post: 1.9 L
FEV1FVC-%CHANGE-POST: 5 %
FEV1FVC-%Pred-Pre: 85 %
FEV6-%CHANGE-POST: 3 %
FEV6-%Pred-Post: 62 %
FEV6-%Pred-Pre: 60 %
FEV6-Post: 2.51 L
FEV6-Pre: 2.42 L
FEV6FVC-%Pred-Post: 101 %
FEV6FVC-%Pred-Pre: 101 %
FVC-%Change-Post: 3 %
FVC-%Pred-Post: 62 %
FVC-%Pred-Pre: 59 %
FVC-Post: 2.51 L
POST FEV1/FVC RATIO: 76 %
PRE FEV1/FVC RATIO: 72 %
Post FEV6/FVC ratio: 100 %
Pre FEV6/FVC Ratio: 100 %
RV % pred: 91 %
RV: 1.79 L
TLC % PRED: 68 %
TLC: 4.3 L

## 2016-07-20 MED ORDER — BUDESONIDE-FORMOTEROL FUMARATE 80-4.5 MCG/ACT IN AERO: 2.0000 | INHALATION_SPRAY | Freq: Two times a day (BID) | RESPIRATORY_TRACT | 11 refills | Status: DC

## 2016-07-20 MED ORDER — MOMETASONE FURO-FORMOTEROL FUM 100-5 MCG/ACT IN AERO: 2.0000 | INHALATION_SPRAY | Freq: Two times a day (BID) | RESPIRATORY_TRACT | 0 refills | Status: DC

## 2016-07-20 NOTE — Progress Notes (Signed)
PFT done today. 

## 2016-07-20 NOTE — Progress Notes (Signed)
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 Asthma     History of Present Illness  06/22/2015 1st [facility] office visit/ Laura Rowland  @ [redacted] weeks gestation on ppi bid but not ac and saba but no longer ICS  Chief Complaint  Patient presents with  . Advice Only    Referred by Laura Rowland; Shortness of breath, thinks she may have asthma.   for long as she can remember when cold gets in chest gets severe symptoms of bad cough by codeine goes away eventually recurred in Dec 2016 > UC with cough/ sob / lost voice dx as asthma rx pred/proair and 100% better an no maint then March 2017 same thing  > UC eval rx one treatment and this resolved but started needing saba more but mostly just with exertion maybe once a day and never noct/ or early. Also loses breath talking.  saba helps  At baseline sob walking entire grocery store, never tried the saba first/ avg saba once daily Since onset IUP = slt more sob/ no increase in saba use  rec Pace yourself and walk slower to avoid becoming too short of breath as this is not healthy for oxygen delivery to the baby's oxygen level Only use your albuterol as a rescue medication Change nexium to Take 30- 60 min before your first and last meals of the day  GERD diet     07/21/2015  f/u ov/Laura Rowland re:   IUP 16 weeks / mild asthma/ no saba need since last ov on gerd rx  Chief Complaint  Patient presents with  . Follow-up  Has learned to pace herself and no long sob rec No change in recommendations    Nov 29th 2017 c section then 3 days later swelling / hbp / sob > [facility]    Admit  date: 12/28/2015 Discharge date: 01/07/2016   HPI: per Laura Rowland, [name] (patient initially admitted to [facility] hospital then transferred to [facility] ICU) [name], 1 week s/p repeat CS here with multiple sx. She reports worsening LE edema about 3 days ago after she was discharged from the hospital. She reports onset of SOB last night. She then had onset of dizziness and HA today. She took Percocet and had no relief. She also reports upper abdominal pain bilateral that radiates to her back. Review of chart indicates pregnancy and postpartum course was uncomplicated.  Hospital Course: Discharge Diagnoses:  Active Problems:   Preeclampsia in postpartum period   Acute pulmonary edema (HCC)   Acute respiratory failure with hypoxia (HCC)   Dyspnea   SOB (shortness of breath)  Acute Hypoxic Respiratory Failure - patient admitted to [facility] hospital with hypoxic respiratory fialure due to pulmonary edema in the setting of pre-eclampsia on underlying ILD from scleroderma. She eventually was transferred to [facility] for critical care assistance. She is s/p C section on 11/29. She was aggressively diuresed and her hypoxia resolved and was weaned off to room air. She is net negative 20L and her weight has improved from 260 lbs on admission to  232 lbs on discharge. She was able to ambulate in the hallway on room air maintaining her sats into the 90s without significant dyspnea. CTA on admission negative for PE. ECHO; normal Ef and with diastolic dysfunction. Sputum culture; growing few streptococcus, patient remain afebrile, WBC normal, discussed with CCM would not start antibiotics at this time.  Scleroderma - Continue with Plaquenil, she is followed by Laura. Leavy Rowland  HTN - she is no longer hypertensive and off of medications    04/18/2016  f/u ov/Laura Rowland re: transiition oc care p admit/  Chronic cough on nexium bid ac in pt with scleroderma Chief Complaint  Patient presents with  .  Follow-up    Follow up per Laura Rowland for scleroderma, Pt here today c/o increase sob, finding it hard to catch her breath, has a non productive coug, slight wheezing at times, Denies fever  Stayed in bed x 8 weeks p [facility] and now 85% better    Using saba three times a week and no neb need at all  Cough/wheeze worse p exertion / not noct   Has not tried inhaler for cough  Doe = MMRC2 = can't walk a nl pace on a flat grade s sob but does fine slow and flat eg shopping  rec Plan A = Automatic = symbicort 80 Take 2 puffs first thing in am and then another 2 puffs about 12 hours later.  Work on inhaler technique:    Plan B = Backup Only use your albuterol as a rescue medication  Plan C = Crisis - only use your albuterol nebulizer if you first try Plan B and it fails to help > ok to use the nebulizer up to every 4 hours but if start needing it regularly call for immediate appointment  Please schedule a follow up visit in 3 months but call sooner if needed with PFT on return       07/20/2016  f/u ov/Laura Rowland re: worsening asthma off symbicort  Chief Complaint  Patient presents with  . Follow-up    PFT's today. Pt states having increased SOB, wheezing and cough- started approx 1 wk ago while at [employer]. She states she has been using her albuterol inhaler 4-5 x per day and neb 2 x daily since having these symptoms.   after symbicort completed  around 1st May  Stopped it thru June 20th and did  [place] =  95% better - then drove to [place] and on first day more doe in heat walking all over [place]  then noct wheeze much better p albuterol  And since then much more saba need assoc with mostly dry coughing fits 07/17/16 eval Laura Rowland, d dimer  0.6 > restarted pred and 75% improved since then   No obvious patterns (other than poor tol to heat / humidity) in  day to day or daytime variability or assoc excess/ purulent sputum or mucus plugs or hemoptysis or cp or chest tightness,  subjective wheeze or overt sinus or hb symptoms. No unusual exp hx or h/o childhood pna/ asthma or knowledge of premature birth.  Also denies any obvious fluctuation of symptoms with weather or environmental changes or other aggravating or alleviating factors except as outlined above   Current Medications, Allergies, Complete Past Medical History, Past Surgical History, Family History, and Social History were reviewed in [employer] record.  ROS  The following are not active complaints unless bolded sore throat, dysphagia, dental problems, itching, sneezing,  nasal  congestion or excess/ purulent secretions, ear ache,   fever, chills, sweats, unintended wt loss, classically pleuritic or exertional cp,  orthopnea pnd or leg swelling, presyncope, palpitations, abdominal pain, anorexia, nausea, vomiting, diarrhea  or change in bowel or bladder habits, change in stools or urine, dysuria,hematuria,  rash, arthralgias, visual complaints, headache, numbness, weakness or ataxia or problems with walking or coordination,  change in mood/affect or memory.                       Objective:   Physical Exam  amb bf typical scleroderma facies   04/18/2016      236   06/22/15 268 lb 6.4 oz (121.745 kg)  06/24/14 250 lb (113.399 kg)  11/03/12 243 lb (110.224 kg)    Vital signs reviewed .- Note on arrival 02 sats  100% on  RA     HEENT: nl dentition, turbinates, and oropharynx. Nl external ear canals without cough reflex   NECK :  without JVD/Nodes/TM/ nl carotid upstrokes bilaterally   LUNGS: no acc muscle use,  Nl contour chest clear to A and P   CV:  RRR  no s3 or murmur or increase in P2, no edema   ABD:  soft and nontender with nl inspiratory excursion in the supine position. No bruits or organomegaly, bowel sounds nl  MS:  Nl gait/ ext warm without deformities, calf tenderness, cyanosis or clubbing No obvious joint restrictions   SKIN: warm and dry without  lesions    NEURO:  alert, approp, nl sensorium with  no motor deficits            Assessment & Plan:

## 2016-07-20 NOTE — Telephone Encounter (Signed)
lmtcb x1 for Laura Flock, PA Nurse

## 2016-07-20 NOTE — Patient Instructions (Addendum)
Plan A = Automatic = symbicort 80 (dulera 100 samples) Take 2 puffs first thing in am and then another 2 puffs about 12 hours later.    Work on inhaler technique:  relax and gently blow all the way out then take a nice smooth deep breath back in, triggering the inhaler at same time you start breathing in.  Hold for up to 5 seconds if you can. Blow out thru nose. Rinse and gargle with water when done     Plan B = Backup Only use your albuterol as a rescue medication to be used if you can't catch your breath by resting or doing a relaxed purse lip breathing pattern.  - The less you use it, the better it will work when you need it. - Ok to use the inhaler up to 2 puffs  every 4 hours if you must but call for appointment if use goes up over your usual need - Don't leave home without it !!  (think of it like the spare tire for your car)   Plan C = Crisis - only use your albuterol nebulizer if you first try Plan B and it fails to help > ok to use the nebulizer up to every 4 hours but if start needing it regularly call for immediate appointment   Please schedule a follow up office visit in 6 weeks, call sooner if needed

## 2016-07-21 NOTE — Assessment & Plan Note (Signed)
PFT's  01/28/15    FEV1 2.02 (60 % ) ratio 65   with DLCO  47 % corrects to 68 % for alv volume - 04/18/2016   try symb 80 2bid  > d/c May 22 2016 and flared off symbicort at [facility] June 2018  - PFT's  07/20/2016  FEV1 1.90 (57 % ) ratio 76  p 9 % improvement from saba p nothing prior to study (while on pred) with DLCO  42 % corrects to 67  % for alv volume   - 07/20/2016  After extensive coaching HFA effectiveness =    75% > continue symb 80 2bid   She is already markedly improved p 2 days of prednisone for flare off ICS so clearly needs to maintain back on symb 80 2bid and increase to 160 2bid if not maintaining same level of improvement once pred is out of her system  I addressed the borderline elevation of d dimer with [name]:  D dimer nl - while  A borderline hihg nl valute  may miss small peripheral pe, the clot burden with sob is moderately high and the d dimer of only 0.6  has a very high neg pred value in this setting  > since she is already much better this is now moot but worth noting her symptoms flared the day she arrived by car to [place] and only occurred p spending a day in the heat so we have an good explanation for her symptoms and would not pursue CTa a this time.    I had an extended discussion with the patient reviewing all relevant studies completed to date and  lasting 15 to 20 minutes of a 25 minute visit    Each maintenance medication was reviewed in detail including most importantly the difference between maintenance and prns and under what circumstances the prns are to be triggered using an action plan format that is not reflected in the computer generated alphabetically organized AVS.    Please see AVS for specific instructions unique to this visit that I personally wrote and verbalized to the the pt in detail and then reviewed with pt  by my nurse highlighting any  changes in therapy recommended at today's visit to their plan of care.

## 2016-07-21 NOTE — Assessment & Plan Note (Signed)
Initial eval [facility] 2007 - f/u Dr Chilton Greathouse  - HRCT 07/08/13 1. Marked progression of interstitial lung disease. Given the strong craniocaudal gradient, marked progression compared to prior study from 08/03/2004, and presence of honeycombing in the lower lobes of the lungs bilaterally, this pattern is compatible with usual interstitial pneumonia (UIP), presumably a manifestation of the patient's underlying scleroderma. 2. Dilated pulmonic trunk (4 cm in diameter), suggestive of pulmonary arterial hypertension. 3. Dilated esophagus presumably secondary to scleroderma. - Echo 01/28/15 s PH - 06/22/2015  Walked RA x 3 laps @ 185 ft each stopped due to end of study, nl pace, no significant desat or sob.  - symptoms improved on gerd rx 07/21/2015 despite additional month of gestation - CT w/o contrast  Chest  02/10/16  1. Near complete resolution of the extensive patchy areas of ground-glass attenuation seen in both lungs on the 12/30/2015 chest CT, most consistent with nearly resolved pulmonary edema. 2. Trace residual left pleural effusion is decreased. 3. Stable top-normal heart size. Stable chronically dilated main pulmonary artery, likely indicating chronic pulmonary arterial hypertension. 4. Basilar predominant fibrotic interstitial lung disease with honeycombing, mildly progressed since 07/08/2013 high-resolution chest CT study, compatible with usual interstitial pneumonia (UIP) due to scleroderma. - Echo 02/09/16 no PAH  PFT's  01/28/15    FEV1 2.02 (60 % ) ratio 65   with DLCO  47 % corrects to 68 % for alv volume - PFT's  07/20/2016  FEV1 1.90 (57 % ) ratio 76  p 9 % improvement from saba p nothing prior to study (while on pred) with DLCO  42 % corrects to 67  % for alv volume    Lung volumes p saba  (FVC 2.51)  the same now as 02/07/15 and dlco's unchanged x 1.5 years, reassuring the pulmonary component of her problem is stable

## 2016-07-23 NOTE — Telephone Encounter (Signed)
Per MW's OV note, he spoke with Noah Charon on Friday. Message will be closed.

## 2016-08-20 DIAGNOSIS — L668 Other cicatricial alopecia: Secondary | ICD-10-CM | POA: Diagnosis not present

## 2016-08-20 DIAGNOSIS — R21 Rash and other nonspecific skin eruption: Secondary | ICD-10-CM | POA: Diagnosis not present

## 2016-08-20 DIAGNOSIS — L669 Cicatricial alopecia, unspecified: Secondary | ICD-10-CM | POA: Diagnosis not present

## 2016-08-29 ENCOUNTER — Other Ambulatory Visit (HOSPITAL_COMMUNITY): Payer: Self-pay | Admitting: Internal Medicine

## 2016-08-29 DIAGNOSIS — J849 Interstitial pulmonary disease, unspecified: Secondary | ICD-10-CM

## 2016-08-30 ENCOUNTER — Other Ambulatory Visit (HOSPITAL_COMMUNITY): Payer: Self-pay | Admitting: Respiratory Therapy

## 2016-08-30 ENCOUNTER — Other Ambulatory Visit (HOSPITAL_COMMUNITY): Payer: Self-pay | Admitting: Internal Medicine

## 2016-08-30 DIAGNOSIS — J8417 Other interstitial pulmonary diseases with fibrosis in diseases classified elsewhere: Principal | ICD-10-CM

## 2016-08-30 DIAGNOSIS — J84178 Other interstitial pulmonary diseases with fibrosis in diseases classified elsewhere: Secondary | ICD-10-CM

## 2016-09-03 ENCOUNTER — Ambulatory Visit (HOSPITAL_COMMUNITY)
Admission: RE | Admit: 2016-09-03 | Discharge: 2016-09-03 | Disposition: A | Discharge status: Home / Self Care | Payer: Medicare HMO | Source: Ambulatory Visit | Attending: Internal Medicine | Admitting: Internal Medicine

## 2016-09-03 DIAGNOSIS — J8417 Other interstitial pulmonary diseases with fibrosis in diseases classified elsewhere: Secondary | ICD-10-CM | POA: Diagnosis present

## 2016-09-03 DIAGNOSIS — M349 Systemic sclerosis, unspecified: Secondary | ICD-10-CM | POA: Insufficient documentation

## 2016-09-03 DIAGNOSIS — K8020 Calculus of gallbladder without cholecystitis without obstruction: Secondary | ICD-10-CM | POA: Insufficient documentation

## 2016-09-03 DIAGNOSIS — R918 Other nonspecific abnormal finding of lung field: Secondary | ICD-10-CM | POA: Insufficient documentation

## 2016-09-03 DIAGNOSIS — L669 Cicatricial alopecia, unspecified: Secondary | ICD-10-CM | POA: Diagnosis not present

## 2016-09-03 DIAGNOSIS — I281 Aneurysm of pulmonary artery: Secondary | ICD-10-CM | POA: Insufficient documentation

## 2016-09-03 DIAGNOSIS — J84178 Other interstitial pulmonary diseases with fibrosis in diseases classified elsewhere: Secondary | ICD-10-CM

## 2016-09-03 LAB — PULMONARY FUNCTION TEST
DL/VA % pred: 61 %
DL/VA: 3.42 ml/min/mmHg/L
DLCO unc % pred: 37 %
DLCO unc: 13.28 ml/min/mmHg
FEF 25-75 Post: 1.72 L/sec
FEF 25-75 Pre: 1.22 L/sec
FEF2575-%Change-Post: 40 %
FEF2575-%Pred-Post: 49 %
FEF2575-%Pred-Pre: 35 %
FEV1-%CHANGE-POST: 8 %
FEV1-%PRED-POST: 63 %
FEV1-%PRED-PRE: 58 %
FEV1-POST: 2.12 L
FEV1-PRE: 1.96 L
FEV1FVC-%Change-Post: 5 %
FEV1FVC-%Pred-Pre: 84 %
FEV6-%Change-Post: 3 %
FEV6-%PRED-POST: 71 %
FEV6-%PRED-PRE: 69 %
FEV6-POST: 2.84 L
FEV6-Pre: 2.75 L
FEV6FVC-%Change-Post: 0 %
FEV6FVC-%PRED-POST: 101 %
FEV6FVC-%Pred-Pre: 100 %
FVC-%Change-Post: 2 %
FVC-%PRED-PRE: 68 %
FVC-%Pred-Post: 70 %
FVC-POST: 2.84 L
FVC-PRE: 2.76 L
PRE FEV1/FVC RATIO: 71 %
PRE FEV6/FVC RATIO: 100 %
Post FEV1/FVC ratio: 75 %
Post FEV6/FVC ratio: 100 %
RV % PRED: 79 %
RV: 1.57 L
TLC % PRED: 70 %
TLC: 4.42 L

## 2016-09-03 MED ORDER — ALBUTEROL SULFATE (2.5 MG/3ML) 0.083% IN NEBU
2.5000 mg | INHALATION_SOLUTION | Freq: Once | RESPIRATORY_TRACT | Status: AC
  Administered 2016-09-03: 2.5 mg via RESPIRATORY_TRACT

## 2016-09-18 DIAGNOSIS — N760 Acute vaginitis: Secondary | ICD-10-CM | POA: Diagnosis not present

## 2016-09-18 DIAGNOSIS — Z30432 Encounter for removal of intrauterine contraceptive device: Secondary | ICD-10-CM | POA: Diagnosis not present

## 2016-10-01 ENCOUNTER — Other Ambulatory Visit: Payer: Self-pay | Admitting: Obstetrics and Gynecology

## 2016-10-01 ENCOUNTER — Other Ambulatory Visit (HOSPITAL_COMMUNITY)
Admission: RE | Admit: 2016-10-01 | Discharge: 2016-10-01 | Disposition: A | Discharge status: Home / Self Care | Payer: Medicare HMO | Source: Ambulatory Visit | Attending: Obstetrics and Gynecology | Admitting: Obstetrics and Gynecology

## 2016-10-01 DIAGNOSIS — Z124 Encounter for screening for malignant neoplasm of cervix: Secondary | ICD-10-CM | POA: Insufficient documentation

## 2016-10-01 DIAGNOSIS — Z01419 Encounter for gynecological examination (general) (routine) without abnormal findings: Secondary | ICD-10-CM | POA: Diagnosis not present

## 2016-10-03 LAB — CYTOLOGY - PAP
DIAGNOSIS: NEGATIVE
HPV: NOT DETECTED

## 2016-10-15 DIAGNOSIS — L669 Cicatricial alopecia, unspecified: Secondary | ICD-10-CM | POA: Diagnosis not present

## 2016-10-15 DIAGNOSIS — L089 Local infection of the skin and subcutaneous tissue, unspecified: Secondary | ICD-10-CM | POA: Diagnosis not present

## 2016-10-16 DIAGNOSIS — L669 Cicatricial alopecia, unspecified: Secondary | ICD-10-CM | POA: Insufficient documentation

## 2016-10-29 DIAGNOSIS — I509 Heart failure, unspecified: Secondary | ICD-10-CM | POA: Diagnosis not present

## 2016-10-29 DIAGNOSIS — L639 Alopecia areata, unspecified: Secondary | ICD-10-CM | POA: Diagnosis not present

## 2016-10-29 DIAGNOSIS — E139 Other specified diabetes mellitus without complications: Secondary | ICD-10-CM | POA: Diagnosis not present

## 2016-10-29 DIAGNOSIS — I270 Primary pulmonary hypertension: Secondary | ICD-10-CM | POA: Diagnosis not present

## 2016-10-29 DIAGNOSIS — M659 Synovitis and tenosynovitis, unspecified: Secondary | ICD-10-CM | POA: Diagnosis not present

## 2016-10-29 DIAGNOSIS — M3489 Other systemic sclerosis: Secondary | ICD-10-CM | POA: Diagnosis not present

## 2016-10-29 DIAGNOSIS — J8489 Other specified interstitial pulmonary diseases: Secondary | ICD-10-CM | POA: Diagnosis not present

## 2016-10-29 DIAGNOSIS — E538 Deficiency of other specified B group vitamins: Secondary | ICD-10-CM | POA: Diagnosis not present

## 2016-10-29 DIAGNOSIS — M940 Chondrocostal junction syndrome [Tietze]: Secondary | ICD-10-CM | POA: Diagnosis not present

## 2016-10-29 DIAGNOSIS — D508 Other iron deficiency anemias: Secondary | ICD-10-CM | POA: Diagnosis not present

## 2016-10-29 DIAGNOSIS — J8417 Other interstitial pulmonary diseases with fibrosis in diseases classified elsewhere: Secondary | ICD-10-CM | POA: Diagnosis not present

## 2016-10-29 DIAGNOSIS — I2721 Secondary pulmonary arterial hypertension: Secondary | ICD-10-CM | POA: Diagnosis not present

## 2016-11-02 ENCOUNTER — Other Ambulatory Visit: Payer: Self-pay | Admitting: Internal Medicine

## 2016-11-02 DIAGNOSIS — M25532 Pain in left wrist: Secondary | ICD-10-CM

## 2016-11-14 ENCOUNTER — Ambulatory Visit
Admission: RE | Admit: 2016-11-14 | Discharge: 2016-11-14 | Disposition: A | Discharge status: Home / Self Care | Payer: Medicare HMO | Source: Ambulatory Visit | Attending: Internal Medicine | Admitting: Internal Medicine

## 2016-11-14 DIAGNOSIS — M25532 Pain in left wrist: Secondary | ICD-10-CM | POA: Diagnosis not present

## 2016-11-14 MED ORDER — IOPAMIDOL (ISOVUE-M 200) INJECTION 41%
1.0000 mL | Freq: Once | INTRAMUSCULAR | Status: AC
  Administered 2016-11-14: 1 mL via INTRA_ARTICULAR

## 2016-11-14 MED ORDER — TRIAMCINOLONE ACETONIDE 40 MG/ML IJ SUSP (RADIOLOGY)
40.0000 mg | Freq: Once | INTRAMUSCULAR | Status: AC
  Administered 2016-11-14: 40 mg via INTRA_ARTICULAR

## 2017-01-08 DIAGNOSIS — E569 Vitamin deficiency, unspecified: Secondary | ICD-10-CM | POA: Diagnosis not present

## 2017-01-08 DIAGNOSIS — R030 Elevated blood-pressure reading, without diagnosis of hypertension: Secondary | ICD-10-CM | POA: Diagnosis not present

## 2017-01-08 DIAGNOSIS — R300 Dysuria: Secondary | ICD-10-CM | POA: Diagnosis not present

## 2017-01-08 DIAGNOSIS — B379 Candidiasis, unspecified: Secondary | ICD-10-CM | POA: Diagnosis not present

## 2017-01-08 DIAGNOSIS — N926 Irregular menstruation, unspecified: Secondary | ICD-10-CM | POA: Diagnosis not present

## 2017-01-08 DIAGNOSIS — L089 Local infection of the skin and subcutaneous tissue, unspecified: Secondary | ICD-10-CM | POA: Diagnosis not present

## 2017-01-08 DIAGNOSIS — Z3202 Encounter for pregnancy test, result negative: Secondary | ICD-10-CM | POA: Diagnosis not present

## 2017-01-08 DIAGNOSIS — M79644 Pain in right finger(s): Secondary | ICD-10-CM | POA: Diagnosis not present

## 2017-01-11 ENCOUNTER — Other Ambulatory Visit (HOSPITAL_COMMUNITY): Payer: Self-pay | Admitting: Internal Medicine

## 2017-01-11 DIAGNOSIS — I272 Pulmonary hypertension, unspecified: Secondary | ICD-10-CM

## 2017-01-14 DIAGNOSIS — M79644 Pain in right finger(s): Secondary | ICD-10-CM | POA: Diagnosis not present

## 2017-01-14 DIAGNOSIS — I7300 Raynaud's syndrome without gangrene: Secondary | ICD-10-CM | POA: Diagnosis not present

## 2017-01-29 ENCOUNTER — Ambulatory Visit (HOSPITAL_COMMUNITY)
Admission: RE | Admit: 2017-01-29 | Discharge: 2017-01-29 | Disposition: A | Discharge status: Home / Self Care | Payer: Medicare HMO | Source: Ambulatory Visit | Attending: Internal Medicine | Admitting: Internal Medicine

## 2017-01-29 DIAGNOSIS — I2721 Secondary pulmonary arterial hypertension: Secondary | ICD-10-CM | POA: Insufficient documentation

## 2017-01-29 DIAGNOSIS — I272 Pulmonary hypertension, unspecified: Secondary | ICD-10-CM

## 2017-01-29 DIAGNOSIS — M3481 Systemic sclerosis with lung involvement: Secondary | ICD-10-CM | POA: Diagnosis not present

## 2017-01-29 NOTE — Progress Notes (Signed)
  Echocardiogram 2D Echocardiogram has been performed.  Laura Rowland 01/29/2017, 8:57 AM

## 2017-02-06 DIAGNOSIS — R293 Abnormal posture: Secondary | ICD-10-CM | POA: Diagnosis not present

## 2017-02-06 DIAGNOSIS — M542 Cervicalgia: Secondary | ICD-10-CM | POA: Diagnosis not present

## 2017-02-06 DIAGNOSIS — M2560 Stiffness of unspecified joint, not elsewhere classified: Secondary | ICD-10-CM | POA: Diagnosis not present

## 2017-02-06 DIAGNOSIS — M9901 Segmental and somatic dysfunction of cervical region: Secondary | ICD-10-CM | POA: Diagnosis not present

## 2017-02-08 DIAGNOSIS — B009 Herpesviral infection, unspecified: Secondary | ICD-10-CM | POA: Diagnosis not present

## 2017-02-08 DIAGNOSIS — K219 Gastro-esophageal reflux disease without esophagitis: Secondary | ICD-10-CM | POA: Diagnosis not present

## 2017-02-08 DIAGNOSIS — I739 Peripheral vascular disease, unspecified: Secondary | ICD-10-CM | POA: Diagnosis not present

## 2017-02-08 DIAGNOSIS — B379 Candidiasis, unspecified: Secondary | ICD-10-CM | POA: Diagnosis not present

## 2017-02-08 DIAGNOSIS — E669 Obesity, unspecified: Secondary | ICD-10-CM | POA: Diagnosis not present

## 2017-02-08 DIAGNOSIS — J45909 Unspecified asthma, uncomplicated: Secondary | ICD-10-CM | POA: Diagnosis not present

## 2017-02-08 DIAGNOSIS — J9610 Chronic respiratory failure, unspecified whether with hypoxia or hypercapnia: Secondary | ICD-10-CM | POA: Diagnosis not present

## 2017-02-08 DIAGNOSIS — L309 Dermatitis, unspecified: Secondary | ICD-10-CM | POA: Diagnosis not present

## 2017-02-08 DIAGNOSIS — L98499 Non-pressure chronic ulcer of skin of other sites with unspecified severity: Secondary | ICD-10-CM | POA: Diagnosis not present

## 2017-02-08 DIAGNOSIS — M349 Systemic sclerosis, unspecified: Secondary | ICD-10-CM | POA: Diagnosis not present

## 2017-02-13 DIAGNOSIS — M542 Cervicalgia: Secondary | ICD-10-CM | POA: Diagnosis not present

## 2017-02-13 DIAGNOSIS — R293 Abnormal posture: Secondary | ICD-10-CM | POA: Diagnosis not present

## 2017-02-13 DIAGNOSIS — M9901 Segmental and somatic dysfunction of cervical region: Secondary | ICD-10-CM | POA: Diagnosis not present

## 2017-02-13 DIAGNOSIS — M2560 Stiffness of unspecified joint, not elsewhere classified: Secondary | ICD-10-CM | POA: Diagnosis not present

## 2017-02-14 DIAGNOSIS — M3489 Other systemic sclerosis: Secondary | ICD-10-CM | POA: Diagnosis not present

## 2017-02-14 DIAGNOSIS — M3500 Sicca syndrome, unspecified: Secondary | ICD-10-CM | POA: Diagnosis not present

## 2017-02-14 DIAGNOSIS — H40013 Open angle with borderline findings, low risk, bilateral: Secondary | ICD-10-CM | POA: Diagnosis not present

## 2017-02-14 DIAGNOSIS — H04123 Dry eye syndrome of bilateral lacrimal glands: Secondary | ICD-10-CM | POA: Diagnosis not present

## 2017-02-14 DIAGNOSIS — Z79899 Other long term (current) drug therapy: Secondary | ICD-10-CM | POA: Diagnosis not present

## 2017-02-15 DIAGNOSIS — M9901 Segmental and somatic dysfunction of cervical region: Secondary | ICD-10-CM | POA: Diagnosis not present

## 2017-02-15 DIAGNOSIS — R293 Abnormal posture: Secondary | ICD-10-CM | POA: Diagnosis not present

## 2017-02-15 DIAGNOSIS — M542 Cervicalgia: Secondary | ICD-10-CM | POA: Diagnosis not present

## 2017-02-15 DIAGNOSIS — M2560 Stiffness of unspecified joint, not elsewhere classified: Secondary | ICD-10-CM | POA: Diagnosis not present

## 2017-02-18 DIAGNOSIS — M542 Cervicalgia: Secondary | ICD-10-CM | POA: Diagnosis not present

## 2017-02-18 DIAGNOSIS — M2560 Stiffness of unspecified joint, not elsewhere classified: Secondary | ICD-10-CM | POA: Diagnosis not present

## 2017-02-18 DIAGNOSIS — R293 Abnormal posture: Secondary | ICD-10-CM | POA: Diagnosis not present

## 2017-02-18 DIAGNOSIS — M9901 Segmental and somatic dysfunction of cervical region: Secondary | ICD-10-CM | POA: Diagnosis not present

## 2017-02-21 DIAGNOSIS — M9901 Segmental and somatic dysfunction of cervical region: Secondary | ICD-10-CM | POA: Diagnosis not present

## 2017-02-21 DIAGNOSIS — M542 Cervicalgia: Secondary | ICD-10-CM | POA: Diagnosis not present

## 2017-02-21 DIAGNOSIS — M2560 Stiffness of unspecified joint, not elsewhere classified: Secondary | ICD-10-CM | POA: Diagnosis not present

## 2017-02-21 DIAGNOSIS — R293 Abnormal posture: Secondary | ICD-10-CM | POA: Diagnosis not present

## 2017-02-22 DIAGNOSIS — Z79899 Other long term (current) drug therapy: Secondary | ICD-10-CM | POA: Diagnosis not present

## 2017-02-22 DIAGNOSIS — H2000 Unspecified acute and subacute iridocyclitis: Secondary | ICD-10-CM | POA: Diagnosis not present

## 2017-02-25 DIAGNOSIS — R293 Abnormal posture: Secondary | ICD-10-CM | POA: Diagnosis not present

## 2017-02-25 DIAGNOSIS — M542 Cervicalgia: Secondary | ICD-10-CM | POA: Diagnosis not present

## 2017-02-25 DIAGNOSIS — M9901 Segmental and somatic dysfunction of cervical region: Secondary | ICD-10-CM | POA: Diagnosis not present

## 2017-02-25 DIAGNOSIS — M2560 Stiffness of unspecified joint, not elsewhere classified: Secondary | ICD-10-CM | POA: Diagnosis not present

## 2017-02-27 DIAGNOSIS — M542 Cervicalgia: Secondary | ICD-10-CM | POA: Diagnosis not present

## 2017-02-27 DIAGNOSIS — R293 Abnormal posture: Secondary | ICD-10-CM | POA: Diagnosis not present

## 2017-02-27 DIAGNOSIS — M2560 Stiffness of unspecified joint, not elsewhere classified: Secondary | ICD-10-CM | POA: Diagnosis not present

## 2017-02-27 DIAGNOSIS — M9901 Segmental and somatic dysfunction of cervical region: Secondary | ICD-10-CM | POA: Diagnosis not present

## 2017-03-01 DIAGNOSIS — M3489 Other systemic sclerosis: Secondary | ICD-10-CM | POA: Diagnosis not present

## 2017-03-04 DIAGNOSIS — H2000 Unspecified acute and subacute iridocyclitis: Secondary | ICD-10-CM | POA: Diagnosis not present

## 2017-03-04 DIAGNOSIS — Z79899 Other long term (current) drug therapy: Secondary | ICD-10-CM | POA: Diagnosis not present

## 2017-03-06 DIAGNOSIS — M9901 Segmental and somatic dysfunction of cervical region: Secondary | ICD-10-CM | POA: Diagnosis not present

## 2017-03-06 DIAGNOSIS — R293 Abnormal posture: Secondary | ICD-10-CM | POA: Diagnosis not present

## 2017-03-06 DIAGNOSIS — M2560 Stiffness of unspecified joint, not elsewhere classified: Secondary | ICD-10-CM | POA: Diagnosis not present

## 2017-03-06 DIAGNOSIS — M542 Cervicalgia: Secondary | ICD-10-CM | POA: Diagnosis not present

## 2017-03-10 IMAGING — CT CT CHEST W/O CM
2 of 3 series · 11 of 36 positions shown, 13 images · non-contrast
Comparison: 01/07/2016 chest radiograph. 12/30/2015 chest CT
angiogram. 07/08/2013 high-resolution chest CT.

CLINICAL DATA: 40-year-old female with a history of scleroderma and
pulmonary fibrosis, presenting for follow-up. Patient reports
improving dyspnea. Patient is a never smoker per [REDACTED].

EXAM:
CT CHEST WITHOUT CONTRAST
TECHNIQUE: Multidetector CT imaging of the chest was performed following the
standard protocol without IV contrast.

[Series 201: chest without, idose (2) · axial · non-contrast · 0.77mm/px · z∈[-353,-106]mm · 8 of 117 slices shown, 10 images]
[im 9/117  mediastinal]
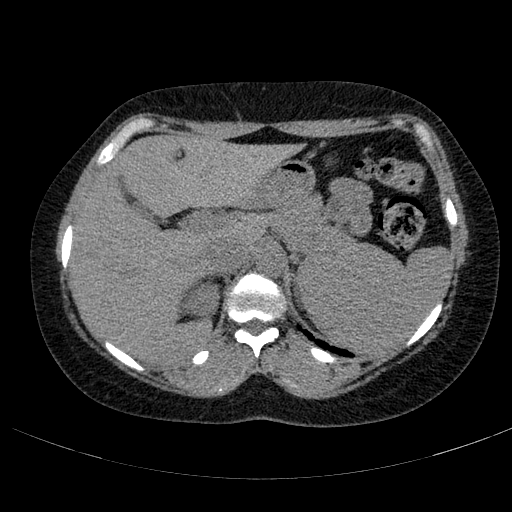
[im 9/117  lung]
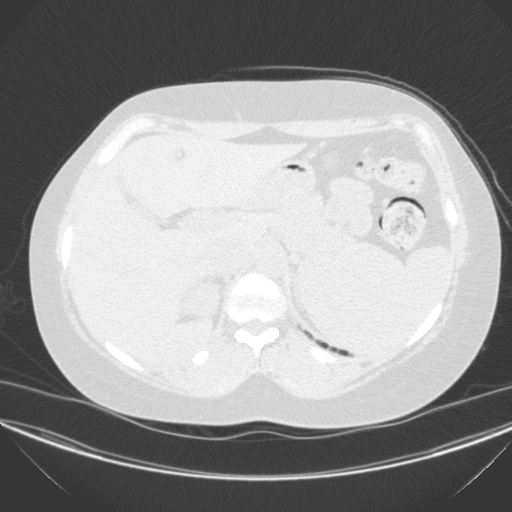
[im 22/117  lung]
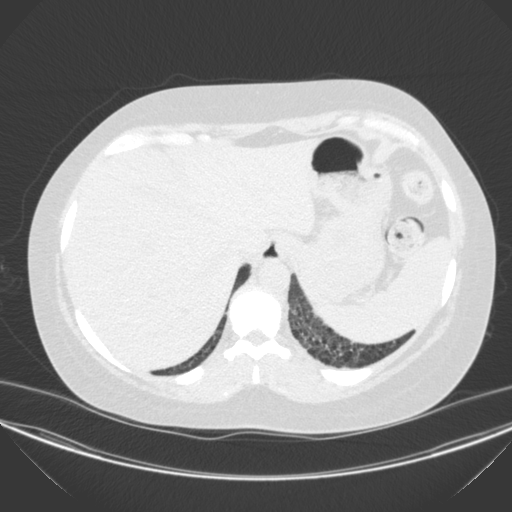
[im 39/117  lung]
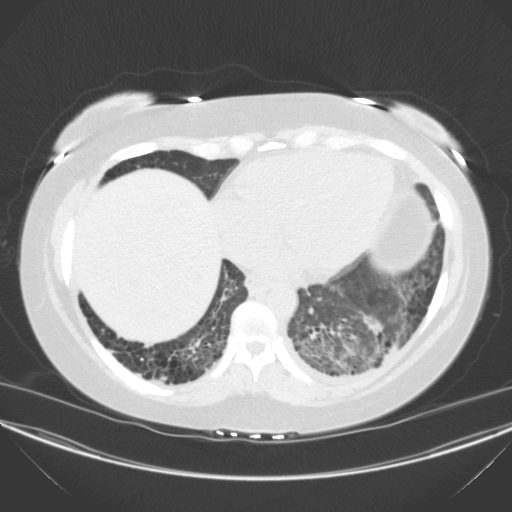
[im 52/117  lung]
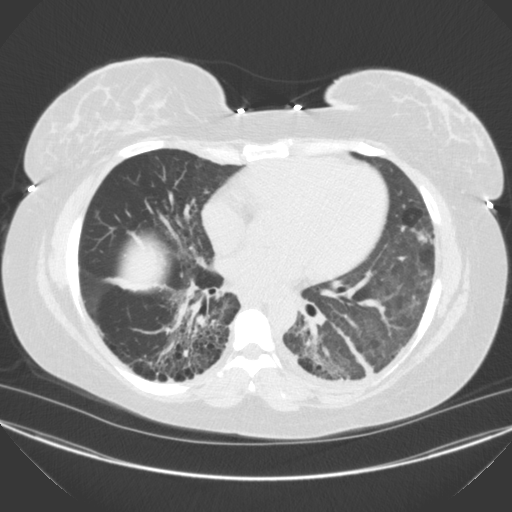
[im 65/117  mediastinal]
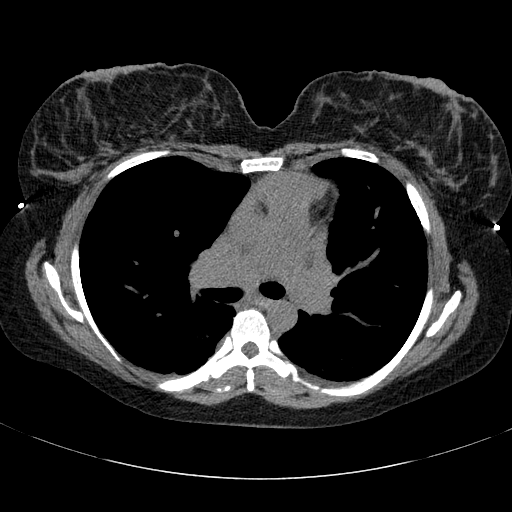
[im 65/117  lung]
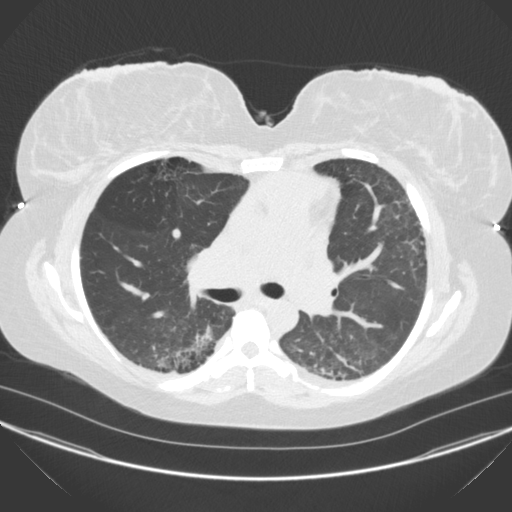
[im 78/117  lung]
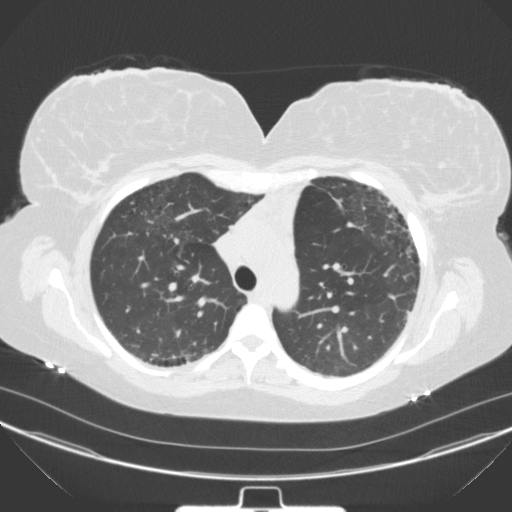
[im 95/117  lung]
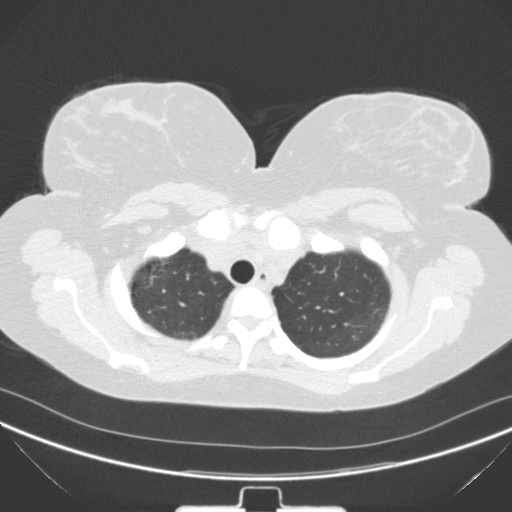
[im 108/117  lung]
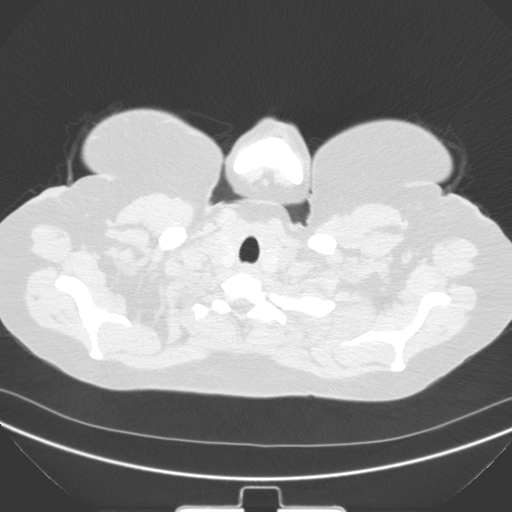

[Series 203: coronal, idose (2) · coronal · 0.45mm/px · 3 of 141 slices shown]
[im 29/141  lung]
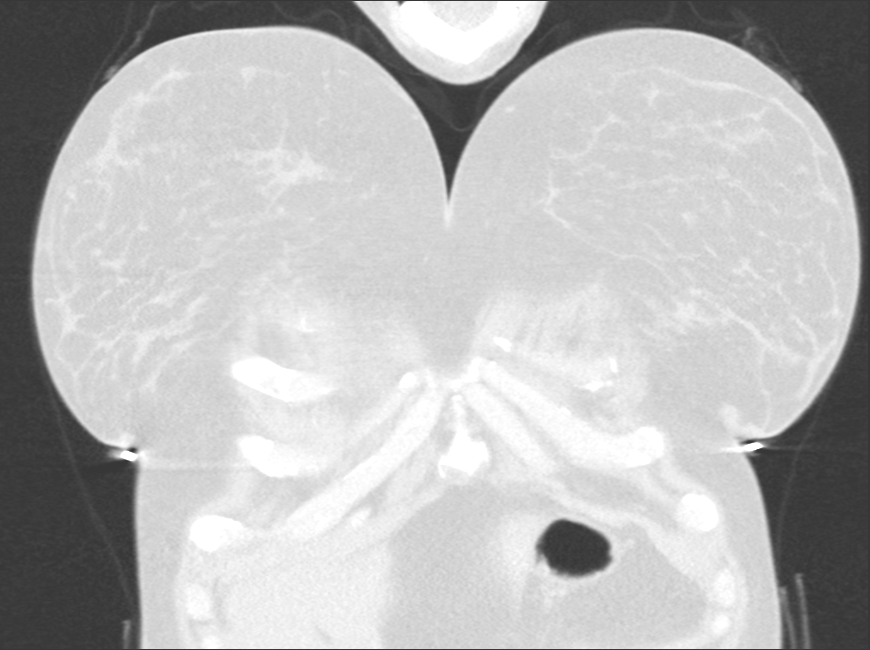
[im 57/141  lung]
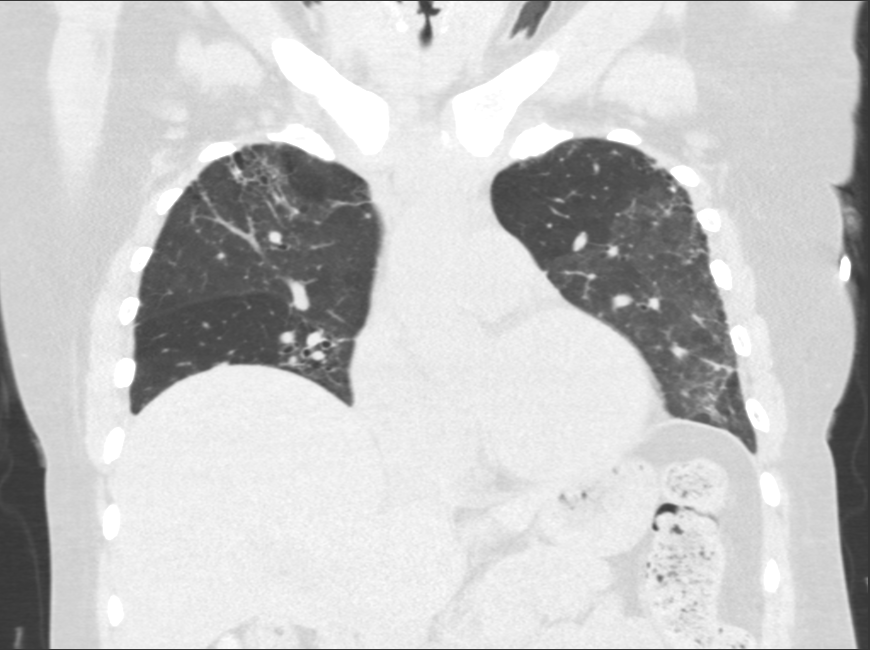
[im 85/141  lung]
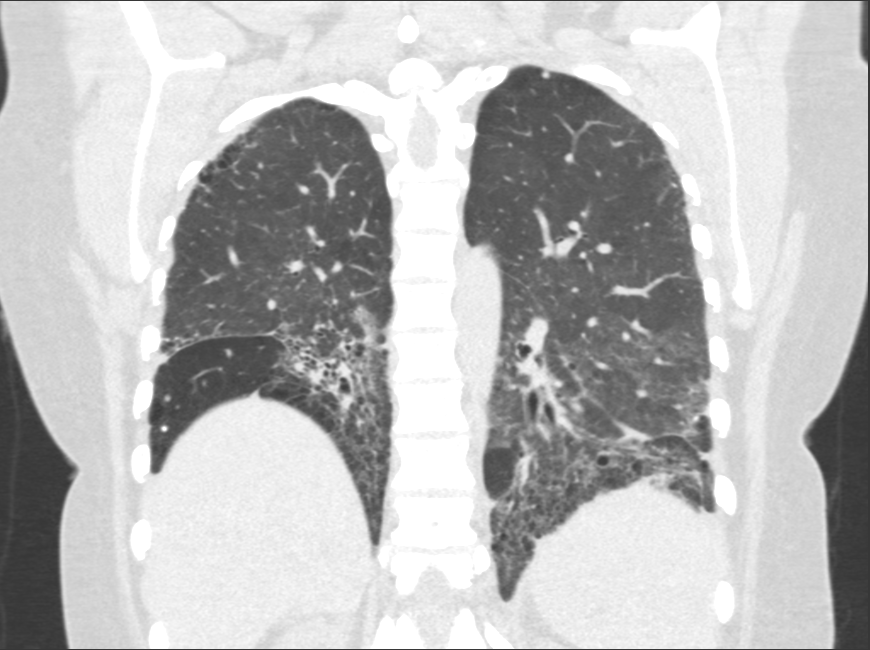

[11 of 36 positions shown; findings below may reference images not displayed]

FINDINGS: Cardiovascular: Stable top-normal heart size. No significant
pericardial fluid/thickening. Thoracic aorta is normal in course and
caliber. Stable dilated main pulmonary artery (3.7 cm diameter).

Mediastinum/Nodes: Stable hypodense 0.9 cm right thyroid lobe
nodule. Mildly patulous thoracic esophagus with fluid levels,
unchanged. No pathologically enlarged axillary, mediastinal or gross
hilar lymph nodes, noting limited sensitivity for the detection of
hilar adenopathy on this noncontrast study. There is a triangular
2.6 x 1.9 cm anterior mediastinal structure at the level of the
aortic arch (series 201/ image 37), stable since 12/30/2015 and
slightly decreased from 2.9 x 1.9 cm on 07/08/2013, with the
suggestion of patchy internal interspersed fat density, most
compatible with a thymic remnant.

Lungs/Pleura: No pneumothorax. No right pleural effusion. Trace left
pleural effusion is decreased. No acute consolidative airspace
disease or lung masses. The previously visualized extensive patchy
areas of ground-glass attenuation in both lungs on the 12/30/2015
chest CT have largely resolved. There are innumerable (> than 30)
subcentimeter solid pulmonary nodules scattered throughout both
lungs, many of which are subpleural. Some of these pulmonary nodules
are stable since 07/08/2013, for example a 6 mm apical right upper
lobe pulmonary nodule (series 205/ image 27). Some of these
pulmonary nodules have increased in size since 07/08/2013, for
example an 8 mm basilar right upper lobe subpleural pulmonary nodule
(series 205/ image 47), increased from 5 mm. Some of these pulmonary
nodules are new since 07/08/2013, largest 6 mm in the right lower
lobe (series 205/image 57). Re- demonstrated is extensive patchy
reticulation and mild ground-glass attenuation throughout both lungs
involving the peribronchovascular and subpleural lungs with
associated moderate traction bronchiectasis and significant
parenchymal distortion and volume loss. There are associated
scattered small areas of honeycombing in the posterior lower lobes
and peripheral upper lobes. There is a basilar predominance to these
findings. There has been mild progression of these findings compared
to the 07/08/2013 high-resolution chest CT study.

Upper abdomen: Subcentimeter hypodense right liver lobe lesion is
too small to characterize and requires no further follow-up.
Cholelithiasis. Colonic diverticulosis.

Musculoskeletal:  No aggressive appearing focal osseous lesions.
IMPRESSION: 1. Near complete resolution of the extensive patchy areas of
ground-glass attenuation seen in both lungs on the 12/30/2015 chest
CT, most consistent with nearly resolved pulmonary edema.
2. Trace residual left pleural effusion is decreased.
3. Stable top-normal heart size. Stable chronically dilated main
pulmonary artery, likely indicating chronic pulmonary arterial
hypertension.
4. Basilar predominant fibrotic interstitial lung disease with
honeycombing, mildly progressed since 07/08/2013 high-resolution
chest CT study, compatible with usual interstitial pneumonia (UIP)
due to scleroderma.
5. Innumerable subcentimeter pulmonary nodules throughout both
lungs, some of which are new or increased mildly in size since
07/08/2013, measuring up to 8 mm. Non-contrast chest CT at 3-6
months is recommended. If the nodules are stable at time of repeat
CT, then future CT at 18-24 months (from today's scan) is considered
optional for low-risk patients, but is recommended for high-risk
patients. This recommendation follows the consensus statement:
Guidelines for Management of Incidental Pulmonary Nodules Detected
[DATE].
6. Mildly patulous thoracic esophagus with fluid levels,
characteristic of scleroderma.
7. Cholelithiasis.
8. Colonic diverticulosis.

## 2017-03-11 DIAGNOSIS — M9901 Segmental and somatic dysfunction of cervical region: Secondary | ICD-10-CM | POA: Diagnosis not present

## 2017-03-11 DIAGNOSIS — M349 Systemic sclerosis, unspecified: Secondary | ICD-10-CM | POA: Diagnosis not present

## 2017-03-11 DIAGNOSIS — M2560 Stiffness of unspecified joint, not elsewhere classified: Secondary | ICD-10-CM | POA: Diagnosis not present

## 2017-03-11 DIAGNOSIS — M542 Cervicalgia: Secondary | ICD-10-CM | POA: Diagnosis not present

## 2017-03-11 DIAGNOSIS — R293 Abnormal posture: Secondary | ICD-10-CM | POA: Diagnosis not present

## 2017-03-12 DIAGNOSIS — R69 Illness, unspecified: Secondary | ICD-10-CM | POA: Diagnosis not present

## 2017-03-14 DIAGNOSIS — M9901 Segmental and somatic dysfunction of cervical region: Secondary | ICD-10-CM | POA: Diagnosis not present

## 2017-03-14 DIAGNOSIS — R293 Abnormal posture: Secondary | ICD-10-CM | POA: Diagnosis not present

## 2017-03-14 DIAGNOSIS — M2560 Stiffness of unspecified joint, not elsewhere classified: Secondary | ICD-10-CM | POA: Diagnosis not present

## 2017-03-14 DIAGNOSIS — M542 Cervicalgia: Secondary | ICD-10-CM | POA: Diagnosis not present

## 2017-03-18 DIAGNOSIS — M2560 Stiffness of unspecified joint, not elsewhere classified: Secondary | ICD-10-CM | POA: Diagnosis not present

## 2017-03-18 DIAGNOSIS — R293 Abnormal posture: Secondary | ICD-10-CM | POA: Diagnosis not present

## 2017-03-18 DIAGNOSIS — M542 Cervicalgia: Secondary | ICD-10-CM | POA: Diagnosis not present

## 2017-03-18 DIAGNOSIS — M9901 Segmental and somatic dysfunction of cervical region: Secondary | ICD-10-CM | POA: Diagnosis not present

## 2017-03-25 DIAGNOSIS — Z008 Encounter for other general examination: Secondary | ICD-10-CM | POA: Diagnosis not present

## 2017-04-04 DIAGNOSIS — Z0000 Encounter for general adult medical examination without abnormal findings: Secondary | ICD-10-CM | POA: Diagnosis not present

## 2017-04-04 DIAGNOSIS — L089 Local infection of the skin and subcutaneous tissue, unspecified: Secondary | ICD-10-CM | POA: Diagnosis not present

## 2017-04-04 DIAGNOSIS — D509 Iron deficiency anemia, unspecified: Secondary | ICD-10-CM | POA: Diagnosis not present

## 2017-04-04 DIAGNOSIS — Z23 Encounter for immunization: Secondary | ICD-10-CM | POA: Diagnosis not present

## 2017-04-04 DIAGNOSIS — J45901 Unspecified asthma with (acute) exacerbation: Secondary | ICD-10-CM | POA: Diagnosis not present

## 2017-04-04 DIAGNOSIS — Z136 Encounter for screening for cardiovascular disorders: Secondary | ICD-10-CM | POA: Diagnosis not present

## 2017-04-04 DIAGNOSIS — K219 Gastro-esophageal reflux disease without esophagitis: Secondary | ICD-10-CM | POA: Diagnosis not present

## 2017-04-04 DIAGNOSIS — J069 Acute upper respiratory infection, unspecified: Secondary | ICD-10-CM | POA: Diagnosis not present

## 2017-04-04 DIAGNOSIS — M349 Systemic sclerosis, unspecified: Secondary | ICD-10-CM | POA: Diagnosis not present

## 2017-04-04 DIAGNOSIS — R7303 Prediabetes: Secondary | ICD-10-CM | POA: Diagnosis not present

## 2017-04-15 DIAGNOSIS — L219 Seborrheic dermatitis, unspecified: Secondary | ICD-10-CM | POA: Diagnosis not present

## 2017-04-15 DIAGNOSIS — L669 Cicatricial alopecia, unspecified: Secondary | ICD-10-CM | POA: Diagnosis not present

## 2017-04-15 DIAGNOSIS — L089 Local infection of the skin and subcutaneous tissue, unspecified: Secondary | ICD-10-CM | POA: Diagnosis not present

## 2017-04-18 DIAGNOSIS — M79644 Pain in right finger(s): Secondary | ICD-10-CM | POA: Diagnosis not present

## 2017-05-09 ENCOUNTER — Encounter: Payer: Self-pay | Admitting: Internal Medicine

## 2017-05-09 ENCOUNTER — Ambulatory Visit: Payer: Medicare HMO | Admitting: Internal Medicine

## 2017-05-09 VITALS — BP 124/84 | HR 93 | Temp 98.1°F | Ht 72.0 in | Wt 250.0 lb

## 2017-05-09 DIAGNOSIS — M349 Systemic sclerosis, unspecified: Secondary | ICD-10-CM

## 2017-05-09 DIAGNOSIS — J4530 Mild persistent asthma, uncomplicated: Secondary | ICD-10-CM

## 2017-05-09 MED ORDER — PREDNISONE 10 MG PO TABS: ORAL_TABLET | ORAL | 0 refills | Status: DC

## 2017-05-09 NOTE — Progress Notes (Signed)
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 Asthma     History of Present Illness  06/22/2015 1st [facility] office visit/ Anastasios Melander  @ [redacted] weeks gestation on ppi bid but not ac and saba but no longer ICS  Chief Complaint  Patient presents with  . Advice Only    Referred by [name]; Shortness of breath, thinks she may have asthma.   for long as she can remember when cold gets in chest gets severe symptoms of bad cough by codeine goes away eventually recurred in Dec 2016 > UC with cough/ sob / lost voice dx as asthma rx pred/proair and 100% better an no maint then March 2017 same thing  > UC eval rx one treatment and this resolved but started needing saba more but mostly just with exertion maybe once a day and never noct/ or early. Also loses breath talking.  saba helps  At baseline sob walking entire grocery store, never tried the saba first/ avg saba once daily Since onset IUP = slt more sob/ no increase in saba use  rec Pace yourself and walk slower to avoid becoming too short of breath as this is not healthy for oxygen delivery to the baby's oxygen level Only use your albuterol as a rescue medication Change nexium to Take 30- 60 min before your first and last meals of the day  GERD diet     07/21/2015  f/u ov/Charrie Mcconnon re:   IUP 16 weeks / mild asthma/ no saba need since last ov on gerd rx  Chief Complaint  Patient presents with  . Follow-up  Has learned to pace herself and no long sob rec No change in recommendations    Nov 29th 2017 c section then 3 days later swelling / hbp / sob > [facility]    Admit  date: 12/28/2015 Discharge date: 01/07/2016   HPI: per [name], [name] (patient initially admitted to [facility] hospital then transferred to [facility] ICU) [name], 1 week s/p repeat CS here with multiple sx. She reports worsening LE edema about 3 days ago after she was discharged from the hospital. She reports onset of SOB last night. She then had onset of dizziness and HA today. She took Percocet and had no relief. She also reports upper abdominal pain bilateral that radiates to her back. Review of chart indicates pregnancy and postpartum course was uncomplicated.  Hospital Course: Discharge Diagnoses:  Active Problems:   Preeclampsia in postpartum period   Acute pulmonary edema (HCC)   Acute respiratory failure with hypoxia (HCC)   Dyspnea   SOB (shortness of breath)  Acute Hypoxic Respiratory Failure - patient admitted to [facility] : [facility] hospital with hypoxic respiratory fialure due to pulmonary edema in the setting of pre-eclampsia on underlying ILD from scleroderma. She eventually was transferred to [facility] for critical care assistance. She is s/p C section on 11/29. She was aggressively diuresed and her hypoxia resolved and was weaned off to room air. She is net negative 20L and her weight has improved from 260 lbs on admission to  232 lbs on discharge. She was able to ambulate in the hallway on room air maintaining her sats into the 90s without significant dyspnea. CTA on admission negative for PE. ECHO; normal Ef and with diastolic dysfunction. Sputum culture; growing few streptococcus, patient remain afebrile, WBC normal, discussed with CCM would not start antibiotics at this time.  Scleroderma - Continue with Plaquenil, she is followed by Dr. [name]  HTN - she is no longer hypertensive and off of medications    04/18/2016  f/u ov/Amaan Meyer re: transiition oc care p admit/  Chronic cough on nexium bid ac in pt with scleroderma Chief Complaint  Patient presents with  .  Follow-up    Follow up per [facility] for scleroderma, Pt here today c/o increase sob, finding it hard to catch her breath, has a non productive coug, slight wheezing at times, Denies fever  Stayed in bed x 8 weeks p [facility] and now 85% better    Using saba three times a week and no neb need at all  Cough/wheeze worse p exertion / not noct   Has not tried inhaler for cough  Doe = MMRC2 = can't walk a nl pace on a flat grade s sob but does fine slow and flat eg shopping  rec Plan A = Automatic = symbicort 80 Take 2 puffs first thing in am and then another 2 puffs about 12 hours later.  Work on inhaler technique:    Plan B = Backup Only use your albuterol as a rescue medication  Plan C = Crisis - only use your albuterol nebulizer if you first try Plan B and it fails to help > ok to use the nebulizer up to every 4 hours but if start needing it regularly call for immediate appointment  Please schedule a follow up visit in 3 months but call sooner if needed with PFT on return       07/20/2016  f/u ov/Lyncoln Ledgerwood re: worsening asthma off symbicort  Chief Complaint  Patient presents with  . Follow-up    PFT's today. Pt states having increased SOB, wheezing and cough- started approx 1 wk ago while at [employer]. She states she has been using her albuterol inhaler 4-5 x per day and neb 2 x daily since having these symptoms.   after symbicort completed  around 1st May  Stopped it thru June 20th and did  [place] =  95% better - then drove to [place] and on first day more doe in heat walking all over [place]  then noct wheeze much better p albuterol  And since then much more saba need assoc with mostly dry coughing fits 07/17/16 eval [name], d dimer  0.6 > restarted pred and 75% improved since then  rec Plan A = Automatic = symbicort 80 (dulera 100 samples) Take 2 puffs first thing in am and then another 2 puffs about 12 hours later.  Work on inhaler technique:     Plan B = Backup Only  use your albuterol as a rescue medication  Plan C = Crisis - only use your albuterol nebulizer if you first try Plan B and it fails to help > ok to use the nebulizer up to every 4 hours but if start needing it regularly call for immediate appointment    05/09/2017 acute extended ov/Nkosi Cortright re: scleroderma with worse sob /dry cough  maint on symbicort 80 2bid  Chief Complaint  Patient presents with  . Acute Visit  Cough and SOB x 1 wk. Cough has not been prod. She states she feels SOB with or without any exertion.   baseline  gso parking lot >  [place] terminal  Much worse since  05/02/17 and rescue helps/ neb helps more assoc with throat fullness and urge to clear throat but cough is dry and day > noct  On nexium bid ac maint Some better with saba     No obvious day to day or daytime variability or assoc excess/ purulent sputum or mucus plugs or hemoptysis or cp or chest tightness, subjective wheeze or overt sinus or hb symptoms. No unusual exposure hx or h/o childhood pna/ asthma or knowledge of premature birth.  Sleeping ok flat without nocturnal  or early am exacerbation  of respiratory  c/o's or need for noct saba. Also denies any obvious fluctuation of symptoms with weather or environmental changes or other aggravating or alleviating factors except as outlined above   Current Allergies, Complete Past Medical History, Past Surgical History, Family History, and Social History were reviewed in [employer] record.  ROS  The following are not active complaints unless bolded Hoarseness, sore throat, dysphagia, dental problems, itching, sneezing,  nasal congestion or discharge of excess mucus or purulent secretions, ear ache,   fever, chills, sweats, unintended wt loss or wt gain, classically pleuritic or exertional cp,  orthopnea pnd or leg swelling, presyncope, palpitations, abdominal pain, anorexia, nausea, vomiting, diarrhea  or change in bowel habits or change in  bladder habits, change in stools or change in urine, dysuria, hematuria,  rash, arthralgias, visual complaints, headache, numbness, weakness or ataxia or problems with walking or coordination,  change in mood/affect or memory.        Current Meds  Medication Sig  . budesonide-formoterol (SYMBICORT) 80-4.5 MCG/ACT inhaler Inhale 2 puffs into the lungs 2 (two) times daily.  . Cholecalciferol (VITAMIN D) 2000 UNITS tablet Take 2,000 Units by mouth daily.  Marland Kitchen esomeprazole (NEXIUM) 40 MG capsule Take 30- 60 min before your first and last meals of the day (Patient taking differently: Take 40 mg by mouth 2 (two) times daily before a meal. Take 30- 60 min before your first and last meals of the day)  . hydroxychloroquine (PLAQUENIL) 200 MG tablet Take 200 mg by mouth 3 (three) times daily. Pt takes 2 tablets 400mg  in the morning and one tablet 200mg  at night  . ibuprofen (ADVIL,MOTRIN) 600 MG tablet Take 1 tablet (600 mg total) by mouth every 6 (six) hours.  Marland Kitchen ipratropium-albuterol (DUONEB) 0.5-2.5 (3) MG/3ML SOLN Take 3 mLs by nebulization 3 (three) times daily. (Patient taking differently: Take 3 mLs by nebulization 3 (three) times daily as needed. )  . Prenatal Vit w/Fe-Methylfol-FA (PNV PO) Take 1 tablet by mouth daily.   Marland Kitchen PROAIR HFA 108 (90 Base) MCG/ACT inhaler Inhale 2 puffs into the lungs every 4 (four) hours as needed for wheezing or shortness of breath.  . valACYclovir (VALTREX) 500 MG tablet Take 500 mg by mouth daily.                             Objective:   Physical Exam  amb bf nad scleroderma facies    05/09/2017        250   04/18/2016      236   06/22/15 268 lb 6.4 oz (121.745 kg)  06/24/14 250 lb (113.399 kg)  11/03/12 243 lb (110.224 kg)  Vital signs reviewed - Note on arrival 02 sats  95% on RA     HEENT: nl dentition, turbinates bilaterally, and oropharynx. Nl external ear canals without cough reflex   NECK :  without JVD/Nodes/TM/ nl carotid upstrokes  bilaterally   LUNGS: no acc muscle use,  Nl contour chest  With distant bilateral exp wheeze/some upper airway component  without cough on insp or exp maneuvers   CV:  RRR  no s3 or murmur or increase in P2, and no edema   ABD:  soft and nontender with nl inspiratory excursion in the supine position. No bruits or organomegaly appreciated, bowel sounds nl  MS:  Nl gait/ ext warm without deformities, calf tenderness, cyanosis or clubbing No obvious joint restrictions   SKIN: warm and dry without lesions    NEURO:  alert, approp, nl sensorium with  no motor or cerebellar deficits apparent.                   Assessment & Plan:

## 2017-05-09 NOTE — Patient Instructions (Signed)
Prednisone 10 mg take  4 each am x 2 days,   2 each am x 2 days,  1 each am x 2 days and stop   If better and stay better > keep appt for August 2019 pfts for scleoderma   If not better, call to do pfts sooner   If better and then get worse, call for double strength symbicort    GERD (REFLUX)  is an extremely common cause of respiratory symptoms just like yours , many times with no obvious heartburn at all.    It can be treated with medication, but also with lifestyle changes including elevation of the head of your bed (ideally with 6 inch  bed blocks),  Smoking cessation, avoidance of late meals, excessive alcohol, and avoid fatty foods, chocolate, peppermint, colas, red wine, and acidic juices such as orange juice.  NO MINT OR MENTHOL PRODUCTS SO NO COUGH DROPS  USE SUGARLESS CANDY INSTEAD (Jolley ranchers or Stover's or Life Savers) or even ice chips will also do - the key is to swallow to prevent all throat clearing. NO OIL BASED VITAMINS - use powdered substitutes.

## 2017-05-11 ENCOUNTER — Encounter: Payer: Self-pay | Admitting: Internal Medicine

## 2017-05-11 NOTE — Assessment & Plan Note (Signed)
Initial eval [facility] 2007 - f/u Dr Chilton Greathouse  - HRCT 07/08/13 1. Marked progression of interstitial lung disease. Given the strong craniocaudal gradient, marked progression compared to prior study from 08/03/2004, and presence of honeycombing in the lower lobes of the lungs bilaterally, this pattern is compatible with usual interstitial pneumonia (UIP), presumably a manifestation of the patient's underlying scleroderma. 2. Dilated pulmonic trunk (4 cm in diameter), suggestive of pulmonary arterial hypertension. 3. Dilated esophagus presumably secondary to scleroderma. - Echo 01/28/15 s PH - 06/22/2015  Walked RA x 3 laps @ 185 ft each stopped due to end of study, nl pace, no significant desat or sob.  - symptoms improved on gerd rx 07/21/2015 despite additional month of gestation - CT w/o contrast  Chest  02/10/16  1. Near complete resolution of the extensive patchy areas of ground-glass attenuation seen in both lungs on the 12/30/2015 chest CT, most consistent with nearly resolved pulmonary edema. 2. Trace residual left pleural effusion is decreased. 3. Stable top-normal heart size. Stable chronically dilated main pulmonary artery, likely indicating chronic pulmonary arterial hypertension. 4. Basilar predominant fibrotic interstitial lung disease with honeycombing, mildly progressed since 07/08/2013 high-resolution chest CT study, compatible with usual interstitial pneumonia (UIP) due to scleroderma. - Echo 02/09/16 no PAH PFT's  01/28/15    FEV1 2.02 (60 % ) ratio 65   with DLCO  47 % corrects to 68 % for alv volume - PFT's  07/20/2016  FEV1 1.90 (57 % ) ratio 76  p 9 % improvement from saba p nothing prior to study (while on pred) with DLCO  42 % corrects to 67  % for alv volume   Abrupt worsening doe resp to saba is more suggestive of asthma component than ILD but could have been related to es dysfunction from scleroderma > no change rx for now - see asthma

## 2017-05-11 NOTE — Assessment & Plan Note (Signed)
PFT's  01/28/15    FEV1 2.02 (60 % ) ratio 65   with DLCO  47 % corrects to 68 % for alv volume - 04/18/2016   try symb 80 2bid  > d/c May 22 2016 and flared off symbicort at [facility] June 2018  - PFT's  07/20/2016  FEV1 1.90 (57 % ) ratio 76  p 9 % improvement from saba p nothing prior to study (while on pred) with DLCO  42 % corrects to 67  % for alv volume    - 05/09/2017  After extensive coaching inhaler device  effectiveness =    75%   Mild flare in pt with underlying scleroderma so could be related to reflux or primary airways dz > will try prednisone and if not better return for w/u with cxr/labs/pfts - if better but deteriorates off pred can try a higher strength of symb otherwise just needs to work harder on using her symb 80 2bid consistently correctly    I had an extended discussion with the patient reviewing all relevant studies completed to date and  lasting 15 to 20 minutes of a 25 minute acute office visit    Each maintenance medication was reviewed in detail including most importantly the difference between maintenance and prns and under what circumstances the prns are to be triggered using an action plan format that is not reflected in the computer generated alphabetically organized AVS.    Please see AVS for specific instructions unique to this visit that I personally wrote and verbalized to the the pt in detail and then reviewed with pt  by my nurse highlighting any  changes in therapy recommended at today's visit to their plan of care.

## 2017-05-20 DIAGNOSIS — R69 Illness, unspecified: Secondary | ICD-10-CM | POA: Diagnosis not present

## 2017-05-22 DIAGNOSIS — Z0100 Encounter for examination of eyes and vision without abnormal findings: Secondary | ICD-10-CM | POA: Diagnosis not present

## 2017-05-22 DIAGNOSIS — H5213 Myopia, bilateral: Secondary | ICD-10-CM | POA: Diagnosis not present

## 2017-06-04 DIAGNOSIS — N760 Acute vaginitis: Secondary | ICD-10-CM | POA: Diagnosis not present

## 2017-07-08 ENCOUNTER — Other Ambulatory Visit: Payer: Self-pay | Admitting: Internal Medicine

## 2017-08-12 ENCOUNTER — Other Ambulatory Visit: Payer: Self-pay | Admitting: Obstetrics and Gynecology

## 2017-08-12 DIAGNOSIS — Z1231 Encounter for screening mammogram for malignant neoplasm of breast: Secondary | ICD-10-CM

## 2017-09-05 ENCOUNTER — Telehealth: Payer: Self-pay | Admitting: Internal Medicine

## 2017-09-05 DIAGNOSIS — M3489 Other systemic sclerosis: Secondary | ICD-10-CM | POA: Diagnosis not present

## 2017-09-05 NOTE — Telephone Encounter (Signed)
Called and spoke with Ranata with Dr. Boris Lown letting her aware pt is scheduled for PFT. Scheduled pt's PFT for Friday 8/16 at 11:00 am before her appt on 8/19.  Nothing further needed

## 2017-09-05 NOTE — Telephone Encounter (Addendum)
Called Dr. Boris Lown but there was no answer. Advised her to call back. There are some PFT openings on that day.   I called pt to schdule. She will come do the PFT on Friday 8/16 at 11:00 am before her appt on 8/19.

## 2017-09-05 NOTE — Telephone Encounter (Signed)
Returning call-please call back at [phone]

## 2017-09-09 ENCOUNTER — Ambulatory Visit: Payer: Medicare HMO | Admitting: Internal Medicine

## 2017-09-16 ENCOUNTER — Ambulatory Visit: Payer: Medicare HMO

## 2017-09-17 ENCOUNTER — Ambulatory Visit
Admission: RE | Admit: 2017-09-17 | Discharge: 2017-09-17 | Disposition: A | Discharge status: Home / Self Care | Payer: Medicare HMO | Source: Ambulatory Visit | Attending: Obstetrics and Gynecology | Admitting: Obstetrics and Gynecology

## 2017-09-17 DIAGNOSIS — Z1231 Encounter for screening mammogram for malignant neoplasm of breast: Secondary | ICD-10-CM

## 2017-10-02 ENCOUNTER — Ambulatory Visit (INDEPENDENT_AMBULATORY_CARE_PROVIDER_SITE_OTHER): Payer: Medicare HMO | Admitting: Internal Medicine

## 2017-10-02 ENCOUNTER — Encounter: Payer: Self-pay | Admitting: Internal Medicine

## 2017-10-02 ENCOUNTER — Ambulatory Visit: Payer: Medicare HMO | Admitting: Internal Medicine

## 2017-10-02 VITALS — BP 142/82 | HR 53 | Ht 72.0 in | Wt 246.0 lb

## 2017-10-02 DIAGNOSIS — M349 Systemic sclerosis, unspecified: Secondary | ICD-10-CM

## 2017-10-02 DIAGNOSIS — J4530 Mild persistent asthma, uncomplicated: Secondary | ICD-10-CM

## 2017-10-02 LAB — PULMONARY FUNCTION TEST
DL/VA % PRED: 60 %
DL/VA: 3.41 ml/min/mmHg/L
DLCO unc % pred: 41 %
DLCO unc: 14.7 ml/min/mmHg
FEF 25-75 POST: 1.29 L/s
FEF 25-75 Pre: 1.5 L/sec
FEF2575-%Change-Post: -14 %
FEF2575-%Pred-Post: 37 %
FEF2575-%Pred-Pre: 43 %
FEV1-%CHANGE-POST: -3 %
FEV1-%PRED-PRE: 65 %
FEV1-%Pred-Post: 62 %
FEV1-PRE: 2.15 L
FEV1-Post: 2.07 L
FEV1FVC-%Change-Post: 4 %
FEV1FVC-%PRED-PRE: 87 %
FEV6-%Change-Post: -7 %
FEV6-%PRED-PRE: 74 %
FEV6-%Pred-Post: 68 %
FEV6-POST: 2.74 L
FEV6-PRE: 2.95 L
FEV6FVC-%Change-Post: 0 %
FEV6FVC-%PRED-POST: 102 %
FEV6FVC-%Pred-Pre: 101 %
FVC-%Change-Post: -7 %
FVC-%PRED-POST: 67 %
FVC-%PRED-PRE: 73 %
FVC-POST: 2.74 L
FVC-PRE: 2.95 L
PRE FEV6/FVC RATIO: 100 %
Post FEV1/FVC ratio: 76 %
Post FEV6/FVC ratio: 100 %
Pre FEV1/FVC ratio: 73 %
RV % pred: 83 %
RV: 1.66 L
TLC % pred: 74 %
TLC: 4.63 L

## 2017-10-02 NOTE — Patient Instructions (Addendum)
Good work on your inhaler technique and no change on your medications  Work on inhaler technique:  relax and gently blow all the way out then take a nice smooth deep breath back in, triggering the inhaler at same time you start breathing in.  Hold for up to 5 seconds if you can. Blow out thru nose. Rinse and gargle with water when done/ ok to do right before dental care      nexium 40 mg Take 30- 60 min before your first and last meals of the day    Please schedule a follow up visit in 12  months but call sooner if needed

## 2017-10-02 NOTE — Progress Notes (Signed)
PFT completed today.Shaughn Thomley,CMA  

## 2017-10-02 NOTE — Progress Notes (Signed)
Subjective:   Patient ID: Laura Rowland, female    DOB: 05-19-1975     MRN: [id number]     Brief patient profile:  38  yobf substitute teacher  never smoker never asthma and [place] eval  2007 for sob dx scleroderma and rec rx by rheumatology = shanahan with serial pfts/echo at [facility] while maintaining on plaquenil and nexium referred to pulmonary clinic 06/22/2015 by Dr Derenda Fennel Medicine with ? Asthma     History of Present Illness  06/22/2015 1st [facility] office visit/ Arinze Rivadeneira  @ [redacted] weeks gestation on ppi bid but not ac and saba but no longer ICS  Chief Complaint  Patient presents with  . Advice Only    Referred by [name]; Shortness of breath, thinks she may have asthma.   for long as she can remember when cold gets in chest gets severe symptoms of bad cough by codeine goes away eventually recurred in Dec 2016 > UC with cough/ sob / lost voice dx as asthma rx pred/proair and 100% better an no maint then March 2017 same thing  > UC eval rx one treatment and this resolved but started needing saba more but mostly just with exertion maybe once a day and never noct/ or early. Also loses breath talking.  saba helps  At baseline sob walking entire grocery store, never tried the saba first/ avg saba once daily Since onset IUP = slt more sob/ no increase in saba use  rec Pace yourself and walk slower to avoid becoming too short of breath as this is not healthy for oxygen delivery to the baby's oxygen level Only use your albuterol as a rescue medication Change nexium to Take 30- 60 min before your first and last meals of the day  GERD diet     07/21/2015  f/u ov/Cris Talavera re:   IUP 16 weeks / mild asthma/ no saba need since last ov on gerd rx  Chief Complaint  Patient presents with  . Follow-up  Has learned to pace herself and no long sob rec No change in recommendations    Nov 29th 2017 c section then 3 days later swelling / hbp / sob > [facility]    Admit  date: 12/28/2015 Discharge date: 01/07/2016   HPI: per [name], [name] (patient initially admitted to [facility] hospital then transferred to [facility] ICU) [id number], 1 week s/p repeat CS here with multiple sx. She reports worsening LE edema about 3 days ago after she was discharged from the hospital. She reports onset of SOB last night. She then had onset of dizziness and HA today. She took Percocet and had no relief. She also reports upper abdominal pain bilateral that radiates to her back. Review of chart indicates pregnancy and postpartum course was uncomplicated.  Hospital Course: Discharge Diagnoses:  Active Problems:   Preeclampsia in postpartum period   Acute pulmonary edema (HCC)   Acute respiratory failure with hypoxia (HCC)   Dyspnea   SOB (shortness of breath)  Acute Hypoxic Respiratory Failure - patient admitted to [facility] hospital with hypoxic respiratory fialure due to pulmonary edema in the setting of pre-eclampsia on underlying ILD from scleroderma. She eventually was transferred to [facility] for critical care assistance. She is s/p C section on 11/29. She was aggressively diuresed and her hypoxia resolved and was weaned off to room air. She is net negative 20L and her weight has improved from 260 lbs on admission  to 232 lbs on discharge. She was able to ambulate in the hallway on room air maintaining her sats into the 90s without significant dyspnea. CTA on admission negative for PE. ECHO; normal Ef and with diastolic dysfunction. Sputum culture; growing few streptococcus, patient remain afebrile, WBC normal, discussed with CCM would not start antibiotics at this time.  Scleroderma - Continue with Plaquenil, she is followed by Dr. [name]  HTN - she is no longer hypertensive and off of medications    04/18/2016  f/u ov/Paublo Warshawsky re: transiition oc care p admit/  Chronic cough on nexium bid ac in pt with scleroderma Chief Complaint  Patient presents with  .  Follow-up    Follow up per [name] for scleroderma, Pt here today c/o increase sob, finding it hard to catch her breath, has a non productive coug, slight wheezing at times, Denies fever  Stayed in bed x 8 weeks p [facility] and now 85% better    Using saba three times a week and no neb need at all  Cough/wheeze worse p exertion / not noct   Has not tried inhaler for cough  Doe = MMRC2 = can't walk a nl pace on a flat grade s sob but does fine slow and flat eg shopping  rec Plan A = Automatic = symbicort 80 Take 2 puffs first thing in am and then another 2 puffs about 12 hours later.  Work on inhaler technique:    Plan B = Backup Only use your albuterol as a rescue medication  Plan C = Crisis - only use your albuterol nebulizer if you first try Plan B and it fails to help > ok to use the nebulizer up to every 4 hours but if start needing it regularly call for immediate appointment  Please schedule a follow up visit in 3 months but call sooner if needed with PFT on return       07/20/2016  f/u ov/Kenneisha Cochrane re: worsening asthma off symbicort  Chief Complaint  Patient presents with  . Follow-up    PFT's today. Pt states having increased SOB, wheezing and cough- started approx 1 wk ago while at [employer]. She states she has been using her albuterol inhaler 4-5 x per day and neb 2 x daily since having these symptoms.   after symbicort completed  around 1st May  Stopped it thru June 20th and did  [place] =  95% better - then drove to [place] and on first day more doe in heat walking all over [place]  then noct wheeze much better p albuterol  And since then much more saba need assoc with mostly dry coughing fits 07/17/16 eval [name], d dimer  0.6 > restarted pred and 75% improved since then  rec Plan A = Automatic = symbicort 80 (dulera 100 samples) Take 2 puffs first thing in am and then another 2 puffs about 12 hours later.  Work on inhaler technique:     Plan B = Backup Only  use your albuterol as a rescue medication  Plan C = Crisis - only use your albuterol nebulizer if you first try Plan B and it fails to help > ok to use the nebulizer up to every 4 hours but if start needing it regularly call for immediate appointment    05/09/2017 acute extended ov/Merlina Marchena re: scleroderma with worse sob /dry cough  maint on symbicort 80 2bid  Chief Complaint  Patient presents with  . Acute Visit  Cough and SOB x 1 wk. Cough has not been prod. She states she feels SOB with or without any exertion.   baseline  gso parking lot >  [place] terminal  Much worse since  05/02/17 and rescue helps/ neb helps more assoc with throat fullness and urge to clear throat but cough is dry and day > noct  On nexium bid ac maint Some better with saba  rec Prednisone 10 mg take  4 each am x 2 days,   2 each am x 2 days,  1 each am x 2 days and stop  If better and stay better > keep appt for August 2019 pfts for scleoderma  If not better, call to do pfts sooner  If better and then get worse, call for double strength symbicort  GERD diet     10/02/2017  f/u ov/Haja Crego re: scleroderma/ prob cough variant asthma vs uacs Chief Complaint  Patient presents with  . Follow-up    WANTS FLU SHOT TODAY;Review PFT: Pt state she is doing pretty good-past few days has had to use her rescue inhaler-one of those happened during the night.   Dyspnea:  MMRC2 = can't walk a nl pace on a flat grade s sob but does fine slow and flat  Cough: no Sleeping: on back on 3 pillows  SABA use: a couple times last week  02: no     No obvious day to day or daytime variability or assoc excess/ purulent sputum or mucus plugs or hemoptysis or cp or chest tightness, subjective wheeze or overt sinus or hb symptoms.   Sleeping as above  without nocturnal  or early am exacerbation  of respiratory  c/o's or need for noct saba. Also denies any obvious fluctuation of symptoms with weather or environmental changes or other  aggravating or alleviating factors except as outlined above   No unusual exposure hx or h/o childhood pna/ asthma or knowledge of premature birth.  Current Allergies, Complete Past Medical History, Past Surgical History, Family History, and Social History were reviewed in [employer] record.  ROS  The following are not active complaints unless bolded Hoarseness, sore throat, dysphagia, dental problems, itching, sneezing,  nasal congestion or discharge of excess mucus or purulent secretions, ear ache,   fever, chills, sweats, unintended wt loss or wt gain, classically pleuritic or exertional cp,  orthopnea pnd or arm/hand swelling  or leg swelling, presyncope, palpitations, abdominal pain, anorexia, nausea, vomiting, diarrhea  or change in bowel habits or change in bladder habits, change in stools or change in urine, dysuria, hematuria,  rash, arthralgias, visual complaints, headache, numbness, weakness or ataxia or problems with walking or coordination,  change in mood or  memory.        Current Meds  Medication Sig  . Cholecalciferol (VITAMIN D) 2000 UNITS tablet Take 2,000 Units by mouth daily.  Marland Kitchen esomeprazole (NEXIUM) 40 MG capsule Take 30- 60 min before your first and last meals of the day (Patient taking differently: Take 40 mg by mouth 2 (two) times daily before a meal. Take 30- 60 min before your first and last meals of the day)  . hydroxychloroquine (PLAQUENIL) 200 MG tablet Take 200 mg by mouth 3 (three) times daily. Pt takes 2 tablets 400mg  in the morning and one tablet 200mg  at night  . ibuprofen (ADVIL,MOTRIN) 800 MG tablet Take 800 mg by mouth every 8 (eight) hours as needed.  Marland Kitchen ipratropium-albuterol (DUONEB) 0.5-2.5 (3) MG/3ML SOLN Take 3 mLs  by nebulization 3 (three) times daily. (Patient taking differently: Take 3 mLs by nebulization 3 (three) times daily as needed. )  . Prenatal Vit w/Fe-Methylfol-FA (PNV PO) Take 1 tablet by mouth daily.   Marland Kitchen PROAIR HFA 108  (90 Base) MCG/ACT inhaler Inhale 2 puffs into the lungs every 4 (four) hours as needed for wheezing or shortness of breath.  . SYMBICORT 80-4.5 MCG/ACT inhaler INHALE 2 PUFFS INTO THE LUNGS TWICE A DAY  . valACYclovir (VALTREX) 500 MG tablet Take 500 mg by mouth daily.                  Objective:   Physical Exam  amb bf nad / classic scleroderma facies   10/02/2017      246 05/09/2017        250   04/18/2016      236   06/22/15 268 lb 6.4 oz (121.745 kg)  06/24/14 250 lb (113.399 kg)  11/03/12 243 lb (110.224 kg)     Vital signs reviewed - Note on arrival 02 sats  95% on RA     HEENT: nl dentition, turbinates bilaterally, and oropharynx. Nl external ear canals without cough reflex   NECK :  without JVD/Nodes/TM/ nl carotid upstrokes bilaterally   LUNGS: no acc muscle use,  Nl contour chest which is clear to A and P bilaterally without cough on insp or exp maneuvers   CV:  RRR  no s3 or murmur or increase in P2, and no edema   ABD:  soft and nontender with nl inspiratory excursion in the supine position. No bruits or organomegaly appreciated, bowel sounds nl  MS:  Nl gait/ ext warm without deformities, calf tenderness, cyanosis or clubbing No obvious joint restrictions   SKIN: warm and dry without lesions    NEURO:  alert, approp, nl sensorium with  no motor or cerebellar deficits apparent.                    Assessment & Plan:

## 2017-10-03 ENCOUNTER — Encounter: Payer: Self-pay | Admitting: Internal Medicine

## 2017-10-03 DIAGNOSIS — R69 Illness, unspecified: Secondary | ICD-10-CM | POA: Diagnosis not present

## 2017-10-03 NOTE — Assessment & Plan Note (Addendum)
PFT's  01/28/15    FEV1 2.02 (60 % ) ratio 65   with DLCO  47 % corrects to 68 % for alv volume - 04/18/2016   try symb 80 2bid  > d/c May 22 2016 and flared off symbicort at [facility] June 2018  - PFT's  07/20/2016  FEV1 1.90 (57 % ) ratio 76  p 9 % improvement from saba p nothing prior to study (while on pred) with DLCO  42 % corrects to 67  % for alv volume      - PFT's  10/02/2017  FEV1 2.15  (65 % ) ratio 73  p no % improvement from saba p symb 80 x 2  prior to study with DLCO  41 % corrects to 60 % for alv volume   - 10/02/2017  After extensive coaching inhaler device,  effectiveness =    90%   All goals of chronic asthma control met including optimal function and elimination of symptoms with minimal need for rescue therapy.  No change in rx needed. Contingencies discussed in full including contacting this office immediately if not controlling the symptoms using the rule of two's.

## 2017-10-03 NOTE — Assessment & Plan Note (Addendum)
Initial eval [facility] 2007 - f/u Dr Chilton Greathouse  - HRCT 07/08/13 1. Marked progression of interstitial lung disease. Given the strong craniocaudal gradient, marked progression compared to prior study from 08/03/2004, and presence of honeycombing in the lower lobes of the lungs bilaterally, this pattern is compatible with usual interstitial pneumonia (UIP), presumably a manifestation of the patient's underlying scleroderma. 2. Dilated pulmonic trunk (4 cm in diameter), suggestive of pulmonary arterial hypertension. 3. Dilated esophagus presumably secondary to scleroderma. - Echo 01/28/15 s PH - 06/22/2015  Walked RA x 3 laps @ 185 ft each stopped due to end of study, nl pace, no significant desat or sob.  - symptoms improved on gerd rx 07/21/2015 despite additional month of gestation - CT w/o contrast  Chest  02/10/16  1. Near complete resolution of the extensive patchy areas of ground-glass attenuation seen in both lungs on the 12/30/2015 chest CT, most consistent with nearly resolved pulmonary edema. 2. Trace residual left pleural effusion is decreased. 3. Stable top-normal heart size. Stable chronically dilated main pulmonary artery, likely indicating chronic pulmonary arterial hypertension. 4. Basilar predominant fibrotic interstitial lung disease with honeycombing, mildly progressed since 07/08/2013 high-resolution chest CT study, compatible with usual interstitial pneumonia (UIP) due to scleroderma. - Echo 02/09/16 no PAH    - PFT's  02/09/16      FVC  2.54 (62%) with dloc 35% corrects to 62 % for alv vol  - PFT's  09/03/16      FVC  2.76 (68%)  with DLCO  37 % corrects to 61 % for alv volume    - PFT's  10/02/2017  FVC 2.95    with DLCO  41 % corrects to 60 % for alv volume   Clinically stable and clearly no progression over the last year so no need to consider OFEV at this point though remains an option.  Discussed in detail all the  indications, usual  risks and alternatives   relative to the benefits with patient who agrees to proceed with conservative f/u as outlined with q 12 m f/u unless new symptoms or req by rheumatology  I did emphasize need to stay on gerd diet and max gerd rx with ppi bid in this setting.   I had an extended discussion with the patient reviewing all relevant studies completed to date and  lasting 15 to 20 minutes of a 25 minute visit    See device teaching which extended face to face time for this visit.  Each maintenance medication was reviewed in detail including emphasizing most importantly the difference between maintenance and prns and under what circumstances the prns are to be triggered using an action plan format that is not reflected in the computer generated alphabetically organized AVS which I have not found useful in most complex patients, especially with respiratory illnesses  Please see AVS for specific instructions unique to this visit that I personally wrote and verbalized to the the pt in detail and then reviewed with pt  by my nurse highlighting any  changes in therapy recommended at today's visit to their plan of care.

## 2017-10-14 DIAGNOSIS — L669 Cicatricial alopecia, unspecified: Secondary | ICD-10-CM | POA: Diagnosis not present

## 2017-10-14 DIAGNOSIS — L219 Seborrheic dermatitis, unspecified: Secondary | ICD-10-CM | POA: Diagnosis not present

## 2017-10-14 DIAGNOSIS — L089 Local infection of the skin and subcutaneous tissue, unspecified: Secondary | ICD-10-CM | POA: Diagnosis not present

## 2017-10-16 DIAGNOSIS — R69 Illness, unspecified: Secondary | ICD-10-CM | POA: Diagnosis not present

## 2017-10-17 ENCOUNTER — Other Ambulatory Visit (HOSPITAL_COMMUNITY)
Admission: RE | Admit: 2017-10-17 | Discharge: 2017-10-17 | Disposition: A | Discharge status: Home / Self Care | Payer: Medicare HMO | Source: Ambulatory Visit | Attending: Nurse Practitioner | Admitting: Nurse Practitioner

## 2017-10-17 ENCOUNTER — Other Ambulatory Visit: Payer: Self-pay | Admitting: Nurse Practitioner

## 2017-10-17 DIAGNOSIS — Z30430 Encounter for insertion of intrauterine contraceptive device: Secondary | ICD-10-CM | POA: Diagnosis not present

## 2017-10-17 DIAGNOSIS — N898 Other specified noninflammatory disorders of vagina: Secondary | ICD-10-CM | POA: Diagnosis not present

## 2017-10-17 DIAGNOSIS — E559 Vitamin D deficiency, unspecified: Secondary | ICD-10-CM | POA: Diagnosis not present

## 2017-10-17 DIAGNOSIS — Z01419 Encounter for gynecological examination (general) (routine) without abnormal findings: Secondary | ICD-10-CM | POA: Diagnosis present

## 2017-10-17 DIAGNOSIS — Z3202 Encounter for pregnancy test, result negative: Secondary | ICD-10-CM | POA: Diagnosis not present

## 2017-10-22 LAB — CYTOLOGY - PAP
DIAGNOSIS: NEGATIVE
HPV: NOT DETECTED

## 2017-11-05 DIAGNOSIS — M349 Systemic sclerosis, unspecified: Secondary | ICD-10-CM | POA: Diagnosis not present

## 2017-11-05 DIAGNOSIS — R0602 Shortness of breath: Secondary | ICD-10-CM | POA: Diagnosis not present

## 2017-11-05 DIAGNOSIS — Z566 Other physical and mental strain related to work: Secondary | ICD-10-CM | POA: Diagnosis not present

## 2017-11-05 DIAGNOSIS — R079 Chest pain, unspecified: Secondary | ICD-10-CM | POA: Diagnosis not present

## 2018-02-10 ENCOUNTER — Other Ambulatory Visit (HOSPITAL_COMMUNITY): Payer: Self-pay | Admitting: Neurology

## 2018-02-10 ENCOUNTER — Other Ambulatory Visit (HOSPITAL_COMMUNITY): Payer: Self-pay | Admitting: Internal Medicine

## 2018-02-10 DIAGNOSIS — I272 Pulmonary hypertension, unspecified: Secondary | ICD-10-CM

## 2018-02-18 ENCOUNTER — Ambulatory Visit (HOSPITAL_COMMUNITY)
Admission: RE | Admit: 2018-02-18 | Discharge: 2018-02-18 | Disposition: A | Discharge status: Home / Self Care | Payer: Medicare Other | Source: Ambulatory Visit | Attending: Internal Medicine | Admitting: Internal Medicine

## 2018-02-18 DIAGNOSIS — I272 Pulmonary hypertension, unspecified: Secondary | ICD-10-CM

## 2018-02-18 DIAGNOSIS — I351 Nonrheumatic aortic (valve) insufficiency: Secondary | ICD-10-CM | POA: Insufficient documentation

## 2018-02-18 DIAGNOSIS — M349 Systemic sclerosis, unspecified: Secondary | ICD-10-CM | POA: Diagnosis not present

## 2018-02-18 DIAGNOSIS — I2721 Secondary pulmonary arterial hypertension: Secondary | ICD-10-CM | POA: Insufficient documentation

## 2018-02-27 ENCOUNTER — Other Ambulatory Visit: Payer: Self-pay | Admitting: General Surgery

## 2018-02-27 DIAGNOSIS — M349 Systemic sclerosis, unspecified: Secondary | ICD-10-CM

## 2018-02-27 DIAGNOSIS — J4530 Mild persistent asthma, uncomplicated: Secondary | ICD-10-CM

## 2018-02-27 MED ORDER — BUDESONIDE-FORMOTEROL FUMARATE 80-4.5 MCG/ACT IN AERO: 2.0000 | INHALATION_SPRAY | Freq: Two times a day (BID) | RESPIRATORY_TRACT | 3 refills | Status: DC

## 2018-03-05 ENCOUNTER — Ambulatory Visit
Admission: RE | Admit: 2018-03-05 | Discharge: 2018-03-05 | Disposition: A | Discharge status: Home / Self Care | Payer: Medicare Other | Source: Ambulatory Visit | Attending: Internal Medicine | Admitting: Internal Medicine

## 2018-03-05 ENCOUNTER — Other Ambulatory Visit: Payer: Self-pay | Admitting: Internal Medicine

## 2018-03-06 ENCOUNTER — Ambulatory Visit: Payer: Medicare Other | Admitting: Podiatry

## 2018-03-06 ENCOUNTER — Encounter: Payer: Self-pay | Admitting: Podiatry

## 2018-03-06 ENCOUNTER — Ambulatory Visit (INDEPENDENT_AMBULATORY_CARE_PROVIDER_SITE_OTHER): Payer: Medicare Other

## 2018-03-06 VITALS — BP 136/78

## 2018-03-06 DIAGNOSIS — L84 Corns and callosities: Secondary | ICD-10-CM | POA: Diagnosis not present

## 2018-03-06 DIAGNOSIS — M2012 Hallux valgus (acquired), left foot: Secondary | ICD-10-CM

## 2018-03-06 DIAGNOSIS — M2041 Other hammer toe(s) (acquired), right foot: Secondary | ICD-10-CM | POA: Diagnosis not present

## 2018-03-06 DIAGNOSIS — M349 Systemic sclerosis, unspecified: Secondary | ICD-10-CM | POA: Diagnosis not present

## 2018-03-06 DIAGNOSIS — M2011 Hallux valgus (acquired), right foot: Secondary | ICD-10-CM

## 2018-03-06 DIAGNOSIS — M2042 Other hammer toe(s) (acquired), left foot: Secondary | ICD-10-CM

## 2018-03-06 NOTE — Patient Instructions (Signed)
Bunion  A bunion is a bump on the base of the big toe that forms when the bones of the big toe joint move out of position. Bunions may be small at first, but they often get larger over time. They can make walking painful. What are the causes? A bunion may be caused by:  Wearing narrow or pointed shoes that force the big toe to press against the other toes.  Abnormal foot development that causes the foot to roll inward (pronate).  Changes in the foot that are caused by certain diseases, such as rheumatoid arthritis or polio.  A foot injury. What increases the risk? The following factors may make you more likely to develop this condition:  Wearing shoes that squeeze the toes together.  Having certain diseases, such as: ? Rheumatoid arthritis. ? Polio. ? Cerebral palsy.  Having family members who have bunions.  Being born with a foot deformity, such as flat feet or low arches.  Doing activities that put a lot of pressure on the feet, such as ballet dancing. What are the signs or symptoms? The main symptom of a bunion is a noticeable bump on the big toe. Other symptoms may include:  Pain.  Swelling around the big toe.  Redness and inflammation.  Thick or hardened skin on the big toe or between the toes.  Stiffness or loss of motion in the big toe.  Trouble with walking. How is this diagnosed? A bunion may be diagnosed based on your symptoms, medical history, and activities. You may have tests, such as:  X-rays. These allow your health care provider to check the position of the bones in your foot and look for damage to your joint. They also help your health care provider determine the severity of your bunion and the best way to treat it.  Joint aspiration. In this test, a sample of fluid is removed from the toe joint. This test may be done if you are in a lot of pain. It helps rule out diseases that cause painful swelling of the joints, such as arthritis. How is this  treated? Treatment depends on the severity of your symptoms. The goal of treatment is to relieve symptoms and prevent the bunion from getting worse. Your health care provider may recommend:  Wearing shoes that have a wide toe box.  Using bunion pads to cushion the affected area.  Taping your toes together to keep them in a normal position.  Placing a device inside your shoe (orthotics) to help reduce pressure on your toe joint.  Taking medicine to ease pain, inflammation, and swelling.  Applying heat or ice to the affected area.  Doing stretching exercises.  Surgery to remove scar tissue and move the toes back into their normal position. This treatment is rare. Follow these instructions at home: Managing pain, stiffness, and swelling   If directed, put ice on the painful area: ? Put ice in a plastic bag. ? Place a towel between your skin and the bag. ? Leave the ice on for 20 minutes, 2-3 times a day. Activity   If directed, apply heat to the affected area before you exercise. Use the heat source that your health care provider recommends, such as a moist heat pack or a heating pad. ? Place a towel between your skin and the heat source. ? Leave the heat on for 20-30 minutes. ? Remove the heat if your skin turns bright red. This is especially important if you are unable to feel pain,   heat, or cold. You may have a greater risk of getting burned.  Do exercises as told by your health care provider. General instructions  Support your toe joint with proper footwear, shoe padding, or taping as told by your health care provider.  Take over-the-counter and prescription medicines only as told by your health care provider.  Keep all follow-up visits as told by your health care provider. This is important. Contact a health care provider if your symptoms:  Get worse.  Do not improve in 2 weeks. Get help right away if you have:  Severe pain and trouble with walking. Summary  A  bunion is a bump on the base of the big toe that forms when the bones of the big toe joint move out of position.  Bunions can make walking painful.  Treatment depends on the severity of your symptoms.  Support your toe joint with proper footwear, shoe padding, or taping as told by your health care provider. This information is not intended to replace advice given to you by your health care provider. Make sure you discuss any questions you have with your health care provider. Document Released: 01/08/2005 Document Revised: 05/21/2017 Document Reviewed: 05/21/2017 Elsevier Interactive Patient Education  2019 [employer]  

## 2018-03-06 NOTE — Progress Notes (Signed)
Subjective:   Patient ID: Laura Rowland, female   DOB: 43 y.o.   MRN: [id number]   HPI Patient with long history of scleroderma with digital deformities bilateral lesion formation third right plantar second metatarsal bilateral that are painful and concerned about healing potential for future with change in shoe gear which is been beneficial.  Patient does not smoke likes to be active   Review of Systems  All other systems reviewed and are negative.       Objective:  Physical Exam Vitals signs and nursing note reviewed.  Constitutional:      Appearance: She is well-developed.  Pulmonary:     Effort: Pulmonary effort is normal.  Musculoskeletal: Normal range of motion.  Skin:    General: Skin is warm.  Neurological:     Mental Status: She is alert.     Neurovascular status was found to be intact muscle strength was adequate with significant rigid contracture lesser digits bilateral with keratotic lesion distal third right and subsecond metatarsal bilateral that are moderately painful when pressed.  Patient does have rigid contracture of the toes and does have lesions that are painful secondary to the structural changes.  Was found to have good digital perfusion is well oriented     Assessment:  Patient with long-term scleroderma that is probably related to healing with digital deformities rigid contracture and chronic keratotic lesion formation     Plan:  H&P x-rays reviewed and today I went ahead debrided lesions with no iatrogenic bleeding and advised on wider shoes mesh material and padding and we discussed possibility for digital correction in the future which could be done but I would like to hold off if possible.  Patient will be seen back for [place] to recheck again when lesions are symptomatic and that will help to dictate what skin to be necessary for her in the future.  Education rendered concerning conditions today  X-rays indicate that there is significant rigid  contracture lesser digits bilateral

## 2018-08-14 ENCOUNTER — Other Ambulatory Visit: Payer: Self-pay | Admitting: Obstetrics and Gynecology

## 2018-08-14 DIAGNOSIS — Z1231 Encounter for screening mammogram for malignant neoplasm of breast: Secondary | ICD-10-CM

## 2018-09-02 ENCOUNTER — Other Ambulatory Visit: Payer: Self-pay | Admitting: *Deleted

## 2018-09-02 DIAGNOSIS — Z20822 Contact with and (suspected) exposure to covid-19: Secondary | ICD-10-CM

## 2018-09-22 NOTE — Addendum Note (Signed)
Addended by: Brigitte Pulse on: 09/22/2018 11:56 AM   Modules accepted: Orders

## 2018-10-01 ENCOUNTER — Other Ambulatory Visit: Payer: Self-pay

## 2018-10-01 ENCOUNTER — Ambulatory Visit
Admission: RE | Admit: 2018-10-01 | Discharge: 2018-10-01 | Disposition: A | Discharge status: Home / Self Care | Payer: Medicare Other | Source: Ambulatory Visit | Attending: Obstetrics and Gynecology | Admitting: Obstetrics and Gynecology

## 2018-10-01 DIAGNOSIS — Z1231 Encounter for screening mammogram for malignant neoplasm of breast: Secondary | ICD-10-CM

## 2018-10-02 ENCOUNTER — Other Ambulatory Visit: Payer: Self-pay | Admitting: Obstetrics and Gynecology

## 2018-10-02 DIAGNOSIS — R928 Other abnormal and inconclusive findings on diagnostic imaging of breast: Secondary | ICD-10-CM

## 2018-10-03 ENCOUNTER — Ambulatory Visit (INDEPENDENT_AMBULATORY_CARE_PROVIDER_SITE_OTHER): Payer: Medicare Other | Admitting: Internal Medicine

## 2018-10-03 ENCOUNTER — Other Ambulatory Visit: Payer: Self-pay

## 2018-10-03 ENCOUNTER — Encounter: Payer: Self-pay | Admitting: Internal Medicine

## 2018-10-03 ENCOUNTER — Ambulatory Visit (INDEPENDENT_AMBULATORY_CARE_PROVIDER_SITE_OTHER): Payer: Medicare Other

## 2018-10-03 VITALS — BP 128/72 | HR 71 | Temp 98.4°F | Ht 72.0 in | Wt 245.4 lb

## 2018-10-03 DIAGNOSIS — Z23 Encounter for immunization: Secondary | ICD-10-CM

## 2018-10-03 DIAGNOSIS — M349 Systemic sclerosis, unspecified: Secondary | ICD-10-CM

## 2018-10-03 DIAGNOSIS — J4530 Mild persistent asthma, uncomplicated: Secondary | ICD-10-CM | POA: Diagnosis not present

## 2018-10-03 MED ORDER — BUDESONIDE-FORMOTEROL FUMARATE 160-4.5 MCG/ACT IN AERO: 2.0000 | INHALATION_SPRAY | Freq: Two times a day (BID) | RESPIRATORY_TRACT | 0 refills | Status: AC

## 2018-10-03 NOTE — Progress Notes (Signed)
Subjective:   Patient ID: Laura Rowland, female    DOB: Jan 17, 1976     MRN: [id number]     Brief patient profile:  46  yobf substitute teacher  never smoker never asthma and Laura Rowland eval  2007 for sob dx scleroderma and rec rx by rheumatology = [place]anahan with serial pfts/echo at [facility] while maintaining on plaquenil and nexium referred to pulmonary clinic 06/22/2015 by Dr Derenda Fennel Medicine with ? Asthma     History of Present Illness  06/22/2015 1st [facility] office visit/ Laura Rowland  @ [redacted] weeks gestation on ppi bid but not ac and saba but no longer ICS  Chief Complaint  Patient presents with  . Advice Only    Referred by Laura Rowland; Shortness of breath, thinks she may have asthma.   for long as she can remember when cold gets in chest gets severe symptoms of bad cough by codeine goes away eventually recurred in Dec 2016 > UC with cough/ sob / lost voice dx as asthma rx pred/proair and 100% better an no maint then March 2017 same thing  > UC eval rx one treatment and this resolved but started needing saba more but mostly just with exertion maybe once a day and never noct/ or early. Also loses breath talking.  saba helps  At baseline sob walking entire grocery store, never tried the saba first/ avg saba once daily Since onset IUP = slt more sob/ no increase in saba use  rec Pace yourself and walk slower to avoid becoming too short of breath as this is not healthy for oxygen delivery to the baby's oxygen level Only use your albuterol as a rescue medication Change nexium to Take 30- 60 min before your first and last meals of the day  GERD diet     07/21/2015  f/u ov/Laura Rowland re:   IUP 16 weeks / mild asthma/ no saba need since last ov on gerd rx  Chief Complaint  Patient presents with  . Follow-up  Has learned to pace herself and no long sob rec No change in recommendations    Nov 29th 2017 c section then 3 days later swelling / hbp / sob > [facility]    Admit  date: 12/28/2015 Discharge date: 01/07/2016   HPI: per Laura Rowland, [name] (patient initially admitted to [facility] then transferred to [facility]) [id number], 1 week s/p repeat CS here with multiple sx. She reports worsening LE edema about 3 days ago after she was discharged from the [facility]. She reports onset of SOB last night. She then had onset of dizziness and HA today. She took Percocet and had no relief. She also reports upper abdominal pain bilateral that radiates to her back. Review of chart indicates pregnancy and postpartum course was uncomplicated.  [facility] Course: Discharge Diagnoses:  Active Problems:   Preeclampsia in postpartum period   Acute pulmonary edema (HCC)   Acute respiratory failure with hypoxia (HCC)   Dyspnea   SOB (shortness of breath)  Acute Hypoxic Respiratory Failure - patient admitted to [facility] with hypoxic respiratory fialure due to pulmonary edema in the setting of pre-eclampsia on underlying ILD from scleroderma. She eventually was transferred to [facility] for critical care assistance. She is s/p C section on 11/29. She was aggressively diuresed and her hypoxia resolved and was weaned off to room air. She is net negative 20L and her weight has improved from 260 lbs on admission  to 232 lbs on discharge. She was able to ambulate in the hallway on room air maintaining her sats into the 90s without significant dyspnea. CTA on admission negative for PE. ECHO; normal Ef and with diastolic dysfunction. Sputum culture; growing few streptococcus, patient remain afebrile, WBC normal, discussed with CCM would not start antibiotics at this time.  Scleroderma - Continue with Plaquenil, she is followed by Dr. Leavy Rowland  HTN - she is no longer hypertensive and off of medications    04/18/2016  f/u ov/Laura Rowland re: transiition oc care p admit/  Chronic cough on nexium bid ac in pt with scleroderma Chief Complaint  Patient presents with  .  Follow-up    Follow up per Laura Rowland for scleroderma, Pt here today c/o increase sob, finding it hard to catch her breath, has a non productive coug, slight wheezing at times, Denies fever  Stayed in bed x 8 weeks p [facility] and now 85% better    Using saba three times a week and no neb need at all  Cough/wheeze worse p exertion / not noct   Has not tried inhaler for cough  Doe = MMRC2 = can't walk a nl pace on a flat grade s sob but does fine slow and flat eg shopping  rec Plan A = Automatic = symbicort 80 Take 2 puffs first thing in am and then another 2 puffs about 12 hours later.  Work on inhaler technique:    Plan B = Backup Only use your albuterol as a rescue medication  Plan C = Crisis - only use your albuterol nebulizer if you first try Plan B and it fails to help > ok to use the nebulizer up to every 4 hours but if start needing it regularly call for immediate appointment  Please schedule a follow up visit in 3 months but call sooner if needed with PFT on return       07/20/2016  f/u ov/Laura Rowland re: worsening asthma off symbicort  Chief Complaint  Patient presents with  . Follow-up    PFT's today. Pt states having increased SOB, wheezing and cough- started approx 1 wk ago while at [employer]. She states she has been using her albuterol inhaler 4-5 x per day and neb 2 x daily since having these symptoms.   after symbicort completed  around 1st May  Stopped it thru June 20th and did  [place] =  95% better - then drove to [place] and on first day more doe in heat walking all over [place]  then noct wheeze much better p albuterol  And since then much more saba need assoc with mostly dry coughing fits 07/17/16 eval Laura Rowland, d dimer  0.6 > restarted pred and 75% improved since then  rec Plan A = Automatic = symbicort 80 (dulera 100 samples) Take 2 puffs first thing in am and then another 2 puffs about 12 hours later.  Work on inhaler technique:     Plan B = Backup Only  use your albuterol as a rescue medication  Plan C = Crisis - only use your albuterol nebulizer if you first try Plan B and it fails to help > ok to use the nebulizer up to every 4 hours but if start needing it regularly call for immediate appointment    05/09/2017 acute extended ov/Wafa Martes re: scleroderma with worse sob /dry cough  maint on symbicort 80 2bid  Chief Complaint  Patient presents with  . Acute Visit  Cough and SOB x 1 wk. Cough has not been prod. She states she feels SOB with or without any exertion.   baseline  gso parking lot >  [place] terminal  Much worse since  05/02/17 and rescue helps/ neb helps more assoc with throat fullness and urge to clear throat but cough is dry and day > noct  On nexium bid ac maint Some better with saba  rec Prednisone 10 mg take  4 each am x 2 days,   2 each am x 2 days,  1 each am x 2 days and stop  If better and stay better > keep appt for August 2019 pfts for scleoderma  If not better, call to do pfts sooner  If better and then get worse, call for double strength symbicort  GERD diet     10/02/2017  f/u ov/Winter Trefz re: scleroderma/ prob cough variant asthma vs uacs Chief Complaint  Patient presents with  . Follow-up    WANTS FLU SHOT TODAY;Review PFT: Pt state she is doing pretty good-past few days has had to use her rescue inhaler-one of those happened during the night.   Dyspnea:  MMRC2 = can't walk a nl pace on a flat grade s sob but does fine slow and flat  Cough: no Sleeping: on back on 3 pillows  SABA use: a couple times last week  02: no   rec Good work on your inhaler technique and no change on your medications Work on inhaler technique:   Nexium 40 mg Take 30- 60 min before your first and last meals of the day     10/03/2018  f/u ov/Vercie Pokorny re: scleroderma on plaquenil per rheum in [place] / cough variant asthma - no recent flares of arthritis/sob  Chief Complaint  Patient presents with  . Mild persistent asthma in adult without  complication    Medications for asthma have been working well since last visit.  Dyspnea:  Better since increased symb to 160 2bid / able to work out but uses saba first  Cough: no Sleeping: on back one pillow SABA use: none 02: none    No obvious day to day or daytime variability or assoc excess/ purulent sputum or mucus plugs or hemoptysis or cp or chest tightness, subjective wheeze or overt sinus or hb symptoms.   Sleeping as above  without nocturnal  or early am exacerbation  of respiratory  c/o's or need for noct saba. Also denies any obvious fluctuation of symptoms with weather or environmental changes or other aggravating or alleviating factors except as outlined above   No unusual exposure hx or h/o childhood pna/ asthma or knowledge of premature birth.  Current Allergies, Complete Past Medical History, Past Surgical History, Family History, and Social History were reviewed in [employer] record.  ROS  The following are not active complaints unless bolded Hoarseness, sore throat, dysphagia, dental problems, itching, sneezing,  nasal congestion or discharge of excess mucus or purulent secretions, ear ache,   fever, chills, sweats, unintended wt loss or wt gain, classically pleuritic or exertional cp,  orthopnea pnd or arm/hand swelling  or leg swelling, presyncope, palpitations, abdominal pain, anorexia, nausea, vomiting, diarrhea  or change in bowel habits or change in bladder habits, change in stools or change in urine, dysuria, hematuria,  rash, arthralgias, visual complaints, headache, numbness, weakness or ataxia or problems with walking or coordination,  change in mood or  memory.        Current Meds  Medication Sig  . betamethasone dipropionate (DIPROLENE) 0.05 % ointment   . Cholecalciferol (VITAMIN D) 2000 UNITS tablet Take 2,000 Units by mouth daily.  . Cholecalciferol (VITAMIN D3) 50 MCG (2000 UT) capsule Take by mouth.  . diclofenac sodium  (VOLTAREN) 1 % GEL apply (2G)  by topical route 4 times every day to the affected area(s)  . doxycycline (VIBRAMYCIN) 100 MG capsule   . esomeprazole (NEXIUM) 40 MG capsule Take 30- 60 min before your first and last meals of the day (Patient taking differently: Take 40 mg by mouth 2 (two) times daily before a meal. Take 30- 60 min before your first and last meals of the day)  . hydroxychloroquine (PLAQUENIL) 200 MG tablet Take 200 mg by mouth 3 (three) times daily. Pt takes 2 tablets 400mg  in the morning and one tablet 200mg  at night  . ibuprofen (ADVIL,MOTRIN) 800 MG tablet Take 800 mg by mouth every 8 (eight) hours as needed.  Marland Kitchen ipratropium-albuterol (DUONEB) 0.5-2.5 (3) MG/3ML SOLN Take 3 mLs by nebulization 3 (three) times daily. (Patient taking differently: Take 3 mLs by nebulization 3 (three) times daily as needed. )  . Prenat-FeFmCb-DSS-FA-DHA w/o A (CITRANATAL HARMONY) 27-1-260 MG CAPS Take 1 tablet by mouth daily.  . Prenatal Vit w/Fe-Methylfol-FA (PNV PO) Take 1 tablet by mouth daily.   Marland Kitchen PROAIR HFA 108 (90 Base) MCG/ACT inhaler Inhale 2 puffs into the lungs every 4 (four) hours as needed for wheezing or shortness of breath.  . valACYclovir (VALTREX) 500 MG tablet Take 500 mg by mouth daily.                   Objective:   Physical Exam  amb bf nad   10/03/2018        245  10/02/2017        246 05/09/2017        250   04/18/2016      236   06/22/15 268 lb 6.4 oz (121.745 kg)  06/24/14 250 lb (113.399 kg)  11/03/12 243 lb (110.224 kg)    Vital signs reviewed - Note on arrival 02 sats  97% on RA     HEENT : pt wearing mask not removed for exam due to covid - 19 concerns.    NECK : without JVD/Nodes/TM/ nl carotid upstrokes bilaterally   LUNGS: no acc muscle use,  Nl contour chest with relatively short Ti  without cough on insp or exp maneuvers   CV:  RRR  no s3 or murmur or increase in P2, and no edema   ABD:  soft and nontender with nl inspiratory excursion in the  supine position. No bruits or organomegaly appreciated, bowel sounds nl  MS:  Nl gait/ ext warm without deformities, calf tenderness, cyanosis or clubbing L index finger with contracted fixed flex dip  SKIN: warm and dry without lesions    NEURO:  alert, approp, nl sensorium with  no motor or cerebellar deficits apparent.        CXR PA and Lateral:   10/03/2018 :    I personally reviewed images and agree with radiology impression as follows:   Unchanged radiographic appearance of the chest. Multifocal scarring and peripheral predominant interstitial lung disease. No superimposed acute abnormality.       Assessment & Plan:

## 2018-10-03 NOTE — Assessment & Plan Note (Signed)
Initial symptom 2006, dx [facility] 2007 - f/u Dr Chilton Greathouse  - HRCT 07/08/13 1. Marked progression of interstitial lung disease. Given the strong craniocaudal gradient, marked progression compared to prior study from 08/03/2004, and presence of honeycombing in the lower lobes of the lungs bilaterally, this pattern is compatible with usual interstitial pneumonia (UIP), presumably a manifestation of the patient's underlying scleroderma. 2. Dilated pulmonic trunk (4 cm in diameter), suggestive of pulmonary arterial hypertension. 3. Dilated esophagus presumably secondary to scleroderma. - Echo 01/28/15 s PH - 06/22/2015  Walked RA x 3 laps @ 185 ft each stopped due to end of study, nl pace, no significant desat or sob.  - symptoms improved on gerd rx 07/21/2015 despite additional month of gestation - CT w/o contrast  Chest  02/10/16  1. Near complete resolution of the extensive patchy areas of ground-glass attenuation seen in both lungs on the 12/30/2015 chest CT, most consistent with nearly resolved pulmonary edema. 2. Trace residual left pleural effusion is decreased. 3. Stable top-normal heart size. Stable chronically dilated main pulmonary artery, likely indicating chronic pulmonary arterial hypertension. 4. Basilar predominant fibrotic interstitial lung disease with honeycombing, mildly progressed since 07/08/2013 high-resolution chest CT study, compatible with usual interstitial pneumonia (UIP) due to scleroderma. - Echo 02/09/16 no PAH    - PFT's  02/09/16  FVC  2.54 (62%) with dloc 35% corrects to 62 % for alv vol  - PFT's  09/03/16  FVC  2.76 (68%)  with DLCO  37 % corrects to 61 % for alv volume    - PFT's  10/02/2017  FVC 2.95    with DLCO  41 % corrects to 60 % for alv volume

## 2018-10-03 NOTE — Patient Instructions (Addendum)
Only use your albuterol as a rescue medication to be used if you can't catch your breath by resting or doing a relaxed purse lip breathing pattern.  - The less you use it, the better it will work when you need it. - Ok to use up to 2 puffs  every 4 hours if you must but call for immediate appointment if use goes up over your usual need - Don't leave home without it !!  (think of it like the spare tire for your car)   Work on inhaler technique:  relax and gently blow all the way out then take a nice smooth deep breath back in, triggering the inhaler at same time you start breathing in.  Hold for up to 5 seconds if you can. Blow out symbicort out thru nose. Rinse and gargle with water when done   Please remember to go to the  x-ray department  for your tests - we will call you with the results when they are available    Please schedule a follow up visit in 12 months but call sooner if needed

## 2018-10-03 NOTE — Assessment & Plan Note (Signed)
PFT's  01/28/15    FEV1 2.02 (60 % ) ratio 65   with DLCO  47 % corrects to 68 % for alv volume - 04/18/2016   try symb 80 2bid  > d/c May 22 2016 and flared off symbicort at [facility] June 2018  - PFT's  07/20/2016  FEV1 1.90 (57 % ) ratio 76  p 9 % improvement from saba p nothing prior to study (while on pred) with DLCO  42 % corrects to 67  % for alv volume   - PFT's  10/02/2017  FEV1 2.15  (65 % ) ratio 73  p no % improvement from saba p symb 80 x 2  prior to study with DLCO  41 % corrects to 60 % for alv volume    - 10/03/2018  After extensive coaching inhaler device,  effectiveness =  75%    Despite suboptimal hfa technique, all goals of chronic asthma control met including optimal function and elimination of symptoms with minimal need for rescue therapy.  Contingencies discussed in full including contacting this office immediately if not controlling the symptoms using the rule of two's.      I had an extended discussion with the patient reviewing all relevant studies completed to date and  lasting 15 to 20 minutes of a 25 minute visit    I performed detailed device teaching using a teach back method which extended face to face time for this visit (see above)  Each maintenance medication was reviewed in detail including emphasizing most importantly the difference between maintenance and prns and under what circumstances the prns are to be triggered using an action plan format that is not reflected in the computer generated alphabetically organized AVS which I have not found useful in most complex patients, especially with respiratory illnesses  Please see AVS for specific instructions unique to this visit that I personally wrote and verbalized to the the pt in detail and then reviewed with pt  by my nurse highlighting any  changes in therapy recommended at today's visit to their plan of care.

## 2018-10-04 ENCOUNTER — Encounter: Payer: Self-pay | Admitting: Internal Medicine

## 2018-10-06 ENCOUNTER — Ambulatory Visit
Admission: RE | Admit: 2018-10-06 | Discharge: 2018-10-06 | Disposition: A | Discharge status: Home / Self Care | Payer: Medicare Other | Source: Ambulatory Visit | Attending: Obstetrics and Gynecology | Admitting: Obstetrics and Gynecology

## 2018-10-06 ENCOUNTER — Other Ambulatory Visit: Payer: Self-pay

## 2018-10-06 ENCOUNTER — Other Ambulatory Visit (HOSPITAL_COMMUNITY): Payer: Self-pay

## 2018-10-06 DIAGNOSIS — N6310 Unspecified lump in the right breast, unspecified quadrant: Secondary | ICD-10-CM

## 2018-10-06 DIAGNOSIS — R928 Other abnormal and inconclusive findings on diagnostic imaging of breast: Secondary | ICD-10-CM

## 2018-10-08 ENCOUNTER — Ambulatory Visit
Admission: RE | Admit: 2018-10-08 | Discharge: 2018-10-08 | Disposition: A | Discharge status: Home / Self Care | Payer: Medicare Other | Source: Ambulatory Visit

## 2018-10-08 ENCOUNTER — Other Ambulatory Visit: Payer: Self-pay | Admitting: Diagnostic Radiology

## 2018-10-08 ENCOUNTER — Other Ambulatory Visit (HOSPITAL_COMMUNITY): Payer: Self-pay

## 2018-10-08 ENCOUNTER — Other Ambulatory Visit: Payer: Self-pay

## 2018-10-08 DIAGNOSIS — N6310 Unspecified lump in the right breast, unspecified quadrant: Secondary | ICD-10-CM

## 2018-10-09 ENCOUNTER — Other Ambulatory Visit: Payer: Self-pay

## 2018-10-09 DIAGNOSIS — Z20822 Contact with and (suspected) exposure to covid-19: Secondary | ICD-10-CM

## 2018-10-11 LAB — NOVEL CORONAVIRUS, NAA: SARS-CoV-2, NAA: NOT DETECTED

## 2018-10-24 ENCOUNTER — Other Ambulatory Visit: Payer: Self-pay

## 2018-10-24 DIAGNOSIS — Z20822 Contact with and (suspected) exposure to covid-19: Secondary | ICD-10-CM

## 2018-10-25 LAB — NOVEL CORONAVIRUS, NAA: SARS-CoV-2, NAA: NOT DETECTED

## 2018-12-10 ENCOUNTER — Other Ambulatory Visit: Payer: Self-pay | Admitting: Family Medicine

## 2018-12-10 DIAGNOSIS — M799 Soft tissue disorder, unspecified: Secondary | ICD-10-CM

## 2019-01-06 ENCOUNTER — Ambulatory Visit
Admission: RE | Admit: 2019-01-06 | Discharge: 2019-01-06 | Disposition: A | Discharge status: Home / Self Care | Payer: Medicare Other | Source: Ambulatory Visit | Attending: Family Medicine | Admitting: Family Medicine

## 2019-01-06 DIAGNOSIS — M799 Soft tissue disorder, unspecified: Secondary | ICD-10-CM

## 2019-01-06 MED ORDER — GADOBENATE DIMEGLUMINE 529 MG/ML IV SOLN
20.0000 mL | Freq: Once | INTRAVENOUS | Status: AC | PRN
  Administered 2019-01-06: 10:00:00 20 mL via INTRAVENOUS

## 2019-02-05 ENCOUNTER — Other Ambulatory Visit (HOSPITAL_COMMUNITY): Payer: Self-pay | Admitting: Internal Medicine

## 2019-02-05 DIAGNOSIS — I2721 Secondary pulmonary arterial hypertension: Secondary | ICD-10-CM

## 2019-02-13 ENCOUNTER — Ambulatory Visit (HOSPITAL_COMMUNITY)
Admission: RE | Admit: 2019-02-13 | Discharge: 2019-02-13 | Disposition: A | Discharge status: Home / Self Care | Payer: Medicare Other | Source: Ambulatory Visit | Attending: Internal Medicine | Admitting: Internal Medicine

## 2019-02-13 ENCOUNTER — Other Ambulatory Visit: Payer: Self-pay

## 2019-02-13 DIAGNOSIS — M349 Systemic sclerosis, unspecified: Secondary | ICD-10-CM | POA: Diagnosis not present

## 2019-02-13 DIAGNOSIS — I371 Nonrheumatic pulmonary valve insufficiency: Secondary | ICD-10-CM | POA: Insufficient documentation

## 2019-02-13 DIAGNOSIS — I2721 Secondary pulmonary arterial hypertension: Secondary | ICD-10-CM | POA: Diagnosis present

## 2019-02-13 NOTE — Progress Notes (Signed)
Echocardiogram 2D Echocardiogram has been performed.  Oneal Deputy Vannie Hochstetler 02/13/2019, 10:39 AM

## 2019-02-26 ENCOUNTER — Other Ambulatory Visit: Payer: Self-pay

## 2019-02-26 DIAGNOSIS — Z20822 Contact with and (suspected) exposure to covid-19: Secondary | ICD-10-CM

## 2019-02-27 LAB — NOVEL CORONAVIRUS, NAA: SARS-CoV-2, NAA: NOT DETECTED

## 2019-03-04 DIAGNOSIS — I509 Heart failure, unspecified: Secondary | ICD-10-CM | POA: Insufficient documentation

## 2019-03-04 DIAGNOSIS — E119 Type 2 diabetes mellitus without complications: Secondary | ICD-10-CM | POA: Insufficient documentation

## 2019-03-12 ENCOUNTER — Other Ambulatory Visit (HOSPITAL_COMMUNITY): Payer: Self-pay | Admitting: Respiratory Therapy

## 2019-03-12 DIAGNOSIS — J8489 Other specified interstitial pulmonary diseases: Secondary | ICD-10-CM

## 2019-03-23 ENCOUNTER — Other Ambulatory Visit (HOSPITAL_COMMUNITY)
Admission: RE | Admit: 2019-03-23 | Discharge: 2019-03-23 | Disposition: A | Discharge status: Home / Self Care | Payer: Medicare Other | Source: Ambulatory Visit | Attending: Internal Medicine | Admitting: Internal Medicine

## 2019-03-23 DIAGNOSIS — Z20822 Contact with and (suspected) exposure to covid-19: Secondary | ICD-10-CM | POA: Diagnosis not present

## 2019-03-23 DIAGNOSIS — Z01812 Encounter for preprocedural laboratory examination: Secondary | ICD-10-CM | POA: Diagnosis present

## 2019-03-23 LAB — SARS CORONAVIRUS 2 (TAT 6-24 HRS): SARS Coronavirus 2: NEGATIVE

## 2019-03-25 ENCOUNTER — Ambulatory Visit (HOSPITAL_COMMUNITY)
Admission: RE | Admit: 2019-03-25 | Discharge: 2019-03-25 | Disposition: A | Discharge status: Home / Self Care | Payer: Medicare Other | Source: Ambulatory Visit | Attending: Internal Medicine | Admitting: Internal Medicine

## 2019-03-25 ENCOUNTER — Other Ambulatory Visit: Payer: Self-pay

## 2019-03-25 DIAGNOSIS — J8489 Other specified interstitial pulmonary diseases: Secondary | ICD-10-CM | POA: Diagnosis not present

## 2019-03-25 LAB — PULMONARY FUNCTION TEST
DL/VA % pred: 90 %
DL/VA: 3.77 ml/min/mmHg/L
DLCO unc % pred: 53 %
DLCO unc: 15.05 ml/min/mmHg
FEF 25-75 Post: 1.59 L/sec
FEF 25-75 Pre: 1.44 L/sec
FEF2575-%Change-Post: 10 %
FEF2575-%Pred-Post: 46 %
FEF2575-%Pred-Pre: 42 %
FEV1-%Change-Post: 2 %
FEV1-%Pred-Post: 63 %
FEV1-%Pred-Pre: 62 %
FEV1-Post: 2.1 L
FEV1-Pre: 2.05 L
FEV1FVC-%Change-Post: 0 %
FEV1FVC-%Pred-Pre: 88 %
FEV6-%Change-Post: 3 %
FEV6-%Pred-Post: 71 %
FEV6-%Pred-Pre: 69 %
FEV6-Post: 2.82 L
FEV6-Pre: 2.73 L
FEV6FVC-%Change-Post: 1 %
FEV6FVC-%Pred-Post: 101 %
FEV6FVC-%Pred-Pre: 99 %
FVC-%Change-Post: 1 %
FVC-%Pred-Post: 70 %
FVC-%Pred-Pre: 69 %
FVC-Post: 2.82 L
FVC-Pre: 2.78 L
Post FEV1/FVC ratio: 74 %
Post FEV6/FVC ratio: 100 %
Pre FEV1/FVC ratio: 74 %
Pre FEV6/FVC Ratio: 98 %
RV % pred: 113 %
RV: 2.27 L
TLC % pred: 82 %
TLC: 5.16 L

## 2019-03-25 MED ORDER — ALBUTEROL SULFATE (2.5 MG/3ML) 0.083% IN NEBU
2.5000 mg | INHALATION_SOLUTION | Freq: Once | RESPIRATORY_TRACT | Status: AC
  Administered 2019-03-25: 2.5 mg via RESPIRATORY_TRACT

## 2019-04-23 DIAGNOSIS — L98499 Non-pressure chronic ulcer of skin of other sites with unspecified severity: Secondary | ICD-10-CM | POA: Insufficient documentation

## 2019-04-27 DIAGNOSIS — L98499 Non-pressure chronic ulcer of skin of other sites with unspecified severity: Secondary | ICD-10-CM | POA: Insufficient documentation

## 2019-05-14 ENCOUNTER — Ambulatory Visit: Payer: Medicare Other | Admitting: Podiatry

## 2019-05-27 ENCOUNTER — Ambulatory Visit (INDEPENDENT_AMBULATORY_CARE_PROVIDER_SITE_OTHER): Payer: Medicare Other

## 2019-05-27 ENCOUNTER — Other Ambulatory Visit: Payer: Self-pay | Admitting: Podiatry

## 2019-05-27 ENCOUNTER — Ambulatory Visit: Payer: Medicare Other | Admitting: Podiatry

## 2019-05-27 ENCOUNTER — Other Ambulatory Visit: Payer: Self-pay

## 2019-05-27 ENCOUNTER — Encounter: Payer: Self-pay | Admitting: Podiatry

## 2019-05-27 VITALS — Temp 96.6°F

## 2019-05-27 DIAGNOSIS — M2041 Other hammer toe(s) (acquired), right foot: Secondary | ICD-10-CM

## 2019-05-27 DIAGNOSIS — M2042 Other hammer toe(s) (acquired), left foot: Secondary | ICD-10-CM

## 2019-05-27 DIAGNOSIS — M79671 Pain in right foot: Secondary | ICD-10-CM

## 2019-05-27 DIAGNOSIS — L84 Corns and callosities: Secondary | ICD-10-CM

## 2019-05-27 NOTE — Progress Notes (Signed)
Subjective:   Patient ID: Laura Rowland, female   DOB: 44 y.o.   MRN: [id number]   HPI Patient presents stating the fifth toes on both her feet are really bothering her and making it hard to wear shoe gear.  States trimming helped her temporarily but it is become more of an issue for her.  Patient states that she has scleroderma but it is been under good control and she does not have any trouble healing and she does have lesions on the bottom of both feet   ROS      Objective:  Physical Exam  Patient presents with keratotic lesions digit 5 of both feet that are very painful with thick lesion formation that even when trimmed give her trouble.  Patient has good digital perfusion well oriented x3 and has lesions on the bottom of both feet that are painful.  Patient has no other health history issues currently     Assessment:  Chronic hammertoe deformity digit 5 bilateral with keratotic lesion formation     Plan:  H&P condition reviewed and debridement accomplished today with no iatrogenic bleeding of the plantar lesions.  Discussed hammertoe of fifth digit bilateral and patient wants surgery and I recommended arthroplasty of digit 5 both feet and at this point I did allow her to read consent form going over alternative treatment and all complications as outlined.  Patient came accept risk and after extensive review signed consent form and is scheduled for outpatient surgery and is encouraged to call with questions understanding no guarantee of healing and that recovery will take approximately 6 months for complete recovery

## 2019-05-27 NOTE — Patient Instructions (Addendum)
Pre-Operative Instructions  Congratulations, you have decided to take an important step towards improving your quality of life.  You can be assured that the doctors and staff at [facility] will be with you every step of the way.  Here are some important things you should know:  1. Plan to be at the surgery center/hospital at least 1 (one) hour prior to your scheduled time, unless otherwise directed by the surgical center/hospital staff.  You must have a responsible adult accompany you, remain during the surgery and drive you home.  Make sure you have directions to the surgical center/hospital to ensure you arrive on time. 2. If you are having surgery at [facility] or [facility] hospitals, you will need a copy of your medical history and physical form from your family physician within one month prior to the date of surgery. We will give you a form for your primary physician to complete.  3. We make every effort to accommodate the date you request for surgery.  However, there are times where surgery dates or times have to be moved.  We will contact you as soon as possible if a change in schedule is required.   4. No aspirin/ibuprofen for one week before surgery.  If you are on aspirin, any non-steroidal anti-inflammatory medications (Mobic, Aleve, Ibuprofen) should not be taken seven (7) days prior to your surgery.  You make take Tylenol for pain prior to surgery.  5. Medications - If you are taking daily heart and blood pressure medications, seizure, reflux, allergy, asthma, anxiety, pain or diabetes medications, make sure you notify the surgery center/hospital before the day of surgery so they can tell you which medications you should take or avoid the day of surgery. 6. No food or drink after midnight the night before surgery unless directed otherwise by surgical center/hospital staff. 7. No alcoholic beverages 24-hours prior to surgery.  No smoking 24-hours prior or 24-hours after  surgery. 8. Wear loose pants or shorts. They should be loose enough to fit over bandages, boots, and casts. 9. Don't wear slip-on shoes. Sneakers are preferred. 10. Bring your boot with you to the surgery center/hospital.  Also bring crutches or a walker if your physician has prescribed it for you.  If you do not have this equipment, it will be provided for you after surgery. 11. If you have not been contacted by the surgery center/hospital by the day before your surgery, call to confirm the date and time of your surgery. 12. Leave-time from work may vary depending on the type of surgery you have.  Appropriate arrangements should be made prior to surgery with your employer. 13. Prescriptions will be provided immediately following surgery by your doctor.  Fill these as soon as possible after surgery and take the medication as directed. Pain medications will not be refilled on weekends and must be approved by the doctor. 14. Remove nail polish on the operative foot and avoid getting pedicures prior to surgery. 15. Wash the night before surgery.  The night before surgery wash the foot and leg well with water and the antibacterial soap provided. Be sure to pay special attention to beneath the toenails and in between the toes.  Wash for at least three (3) minutes. Rinse thoroughly with water and dry well with a towel.  Perform this wash unless told not to do so by your physician.  Enclosed: 1 Ice pack (please put in freezer the night before surgery)   1 Hibiclens skin cleaner     Pre-op instructions  If you have any questions regarding the instructions, please do not hesitate to call our office.  [place]: [address], [place], [place] [address] -- [phone]  [place]: [address] , [place] [address] -- [phone]  [place]: [address], [place], [place] [address] -- [phone]   Website: [email or web address] 

## 2019-06-02 ENCOUNTER — Telehealth: Payer: Self-pay

## 2019-06-02 NOTE — Telephone Encounter (Signed)
DOS 06/09/19  HAMMERTOE REPAIR 5TH B/L - [id number]  [employer] EFFECTIVE DATE - 03/23/19   PLAN DEDUCTIBLE - $0.00 OUT OF POCKET - $3600 W/ $3320.00 REMAINING  CO-INSURANCE 0% / Day OUTPATIENT SURGERY 0% / [place] $295 / Day OUTPATIENT SURGERY $295 / [place]  [id number]:  CORRECTION, HAMMERTOE (EG, INTERPHALANGEAL FUSION, PARTIAL OR TOTAL PHALANGECTOMY)  Servicing Provider:REGAL, NORMAN; [id number], [place] [address]  Step 2: 1. Procedures performed at an [place] do not require prior authorization.

## 2019-06-08 MED ORDER — HYDROCODONE-ACETAMINOPHEN 10-325 MG PO TABS: 1.0000 | ORAL_TABLET | ORAL | 0 refills | Status: AC | PRN

## 2019-06-08 NOTE — Addendum Note (Signed)
Addended by: Wallene Huh on: 06/08/2019 05:21 PM   Modules accepted: Orders

## 2019-06-09 DIAGNOSIS — M2042 Other hammer toe(s) (acquired), left foot: Secondary | ICD-10-CM

## 2019-06-09 DIAGNOSIS — M2041 Other hammer toe(s) (acquired), right foot: Secondary | ICD-10-CM

## 2019-06-15 ENCOUNTER — Encounter: Payer: Self-pay | Admitting: Podiatry

## 2019-06-15 ENCOUNTER — Other Ambulatory Visit: Payer: Self-pay

## 2019-06-15 ENCOUNTER — Ambulatory Visit (INDEPENDENT_AMBULATORY_CARE_PROVIDER_SITE_OTHER): Payer: Medicare Other

## 2019-06-15 ENCOUNTER — Ambulatory Visit (INDEPENDENT_AMBULATORY_CARE_PROVIDER_SITE_OTHER): Payer: Medicare Other | Admitting: Podiatry

## 2019-06-15 VITALS — BP 111/67 | HR 81 | Temp 97.6°F | Resp 16

## 2019-06-15 DIAGNOSIS — M2041 Other hammer toe(s) (acquired), right foot: Secondary | ICD-10-CM

## 2019-06-15 DIAGNOSIS — M2042 Other hammer toe(s) (acquired), left foot: Secondary | ICD-10-CM | POA: Diagnosis not present

## 2019-06-17 NOTE — Progress Notes (Signed)
Subjective:   Patient ID: Laura Rowland, female   DOB: 44 y.o.   MRN: [id number]   HPI Patient presents stating of doing really well with minimal discomfort and walking without pain   ROS      Objective:  Physical Exam  Neurovascular status intact with patient's fifth digit is doing well wound edges well coapted     Assessment:  Doing well post arthroplasty digit 5 bilateral     Plan:  Evaluated finding that the incision sites are healing well wound edges well coapted reapplied sterile dressing continued elevation immobilization reappoint 2 weeks suture removal or earlier if needed  X-rays indicate satisfactory section of bone good alignment fifth digit bilateral

## 2019-06-29 ENCOUNTER — Ambulatory Visit (INDEPENDENT_AMBULATORY_CARE_PROVIDER_SITE_OTHER): Payer: Medicare Other | Admitting: Podiatry

## 2019-06-29 ENCOUNTER — Encounter: Payer: Self-pay | Admitting: Podiatry

## 2019-06-29 ENCOUNTER — Other Ambulatory Visit: Payer: Self-pay

## 2019-06-29 ENCOUNTER — Ambulatory Visit (INDEPENDENT_AMBULATORY_CARE_PROVIDER_SITE_OTHER): Payer: Medicare Other

## 2019-06-29 VITALS — Temp 97.6°F

## 2019-06-29 DIAGNOSIS — M2042 Other hammer toe(s) (acquired), left foot: Secondary | ICD-10-CM

## 2019-06-29 DIAGNOSIS — M2041 Other hammer toe(s) (acquired), right foot: Secondary | ICD-10-CM

## 2019-06-29 NOTE — Progress Notes (Signed)
Subjective:   Patient ID: Laura Rowland, female   DOB: 44 y.o.   MRN: [id number]   HPI Patient states she is doing well with minimal discomfort   ROS      Objective:  Physical Exam  Neurovascular status intact with patient's fifth digits bilateral in good alignment wound edges well coapted stitches intact     Assessment:  Doing well after having arthroplasty fifth digit bilateral     Plan:  Sutures removed x-rays reviewed and recommended the continuation of wider shoes soaks and patient will be seen back as needed.  X-rays indicate satisfactory section head of proximal phalanx digit 5 both feet

## 2019-08-03 ENCOUNTER — Encounter: Payer: Self-pay | Admitting: Podiatry

## 2019-08-03 ENCOUNTER — Ambulatory Visit (INDEPENDENT_AMBULATORY_CARE_PROVIDER_SITE_OTHER): Payer: Medicare Other

## 2019-08-03 ENCOUNTER — Other Ambulatory Visit: Payer: Self-pay

## 2019-08-03 ENCOUNTER — Ambulatory Visit (INDEPENDENT_AMBULATORY_CARE_PROVIDER_SITE_OTHER): Payer: Medicare Other | Admitting: Podiatry

## 2019-08-03 DIAGNOSIS — M2042 Other hammer toe(s) (acquired), left foot: Secondary | ICD-10-CM

## 2019-08-03 DIAGNOSIS — L02612 Cutaneous abscess of left foot: Secondary | ICD-10-CM

## 2019-08-03 DIAGNOSIS — L03032 Cellulitis of left toe: Secondary | ICD-10-CM

## 2019-08-03 MED ORDER — DOXYCYCLINE HYCLATE 100 MG PO TABS: 100.0000 mg | ORAL_TABLET | Freq: Two times a day (BID) | ORAL | 0 refills | Status: DC

## 2019-08-03 NOTE — Progress Notes (Signed)
Subjective:   Patient ID: Laura Rowland, female   DOB: 44 y.o.   MRN: [id number]   HPI Patient presents stating she has had some irritation in her fifth digit of her left foot and states she feels like there might be a stitch left and there is been a spot that has been sensitive.  Patient states overall she is doing pretty well and does still have some swelling which is normal   ROS      Objective:  Physical Exam  Neurovascular status intact negative Laura Rowland' sign was noted with patient's left fifth digit found to have moderate irritation with a crusted area in the midportion of the digit itself     Assessment:  Localized abscess of the fifth digit left with possibility for foreign body or suture     Plan:  H&P reviewed condition and x-ray.  I did a proximal nerve block of the area I then went ahead and I using sterile instrumentation open the area found a piece of stitch that was deep into the tissue and not visible to the naked eye and I cleaned and took this out and then I cleaned out some slight localized abscess tissue with no active drainage noted.  I flushed the area applied Iodosorb with sterile dressing gave instructions on soaks and is precautionary measure placed on doxycycline.  Gave strict instructions that if any other pathology were to occur to let us know immediately but it should heal uneventfully at this time.  Continue open toed shoes for the next week or 2 and pain should receive very quickly  X-rays indicate that the fifth digit resection of bone looks good with no indications of osteolysis

## 2019-09-01 DIAGNOSIS — B349 Viral infection, unspecified: Secondary | ICD-10-CM | POA: Insufficient documentation

## 2019-09-01 DIAGNOSIS — I878 Other specified disorders of veins: Secondary | ICD-10-CM | POA: Insufficient documentation

## 2019-09-08 ENCOUNTER — Other Ambulatory Visit: Payer: Self-pay | Admitting: Obstetrics and Gynecology

## 2019-09-08 DIAGNOSIS — Z1231 Encounter for screening mammogram for malignant neoplasm of breast: Secondary | ICD-10-CM

## 2019-10-05 ENCOUNTER — Ambulatory Visit (INDEPENDENT_AMBULATORY_CARE_PROVIDER_SITE_OTHER): Payer: Medicare Other

## 2019-10-05 ENCOUNTER — Encounter: Payer: Self-pay | Admitting: Internal Medicine

## 2019-10-05 ENCOUNTER — Other Ambulatory Visit: Payer: Self-pay

## 2019-10-05 ENCOUNTER — Ambulatory Visit (INDEPENDENT_AMBULATORY_CARE_PROVIDER_SITE_OTHER): Payer: Medicare Other | Admitting: Internal Medicine

## 2019-10-05 DIAGNOSIS — R0789 Other chest pain: Secondary | ICD-10-CM | POA: Diagnosis not present

## 2019-10-05 DIAGNOSIS — J4530 Mild persistent asthma, uncomplicated: Secondary | ICD-10-CM

## 2019-10-05 DIAGNOSIS — M349 Systemic sclerosis, unspecified: Secondary | ICD-10-CM

## 2019-10-05 NOTE — Progress Notes (Signed)
Subjective:   Patient ID: Laura Rowland, female    DOB: 06-Dec-1975     MRN: [id number]     Brief patient profile:  47  yobf substitute teacher  never smoker never asthma and [place] eval  2007 for sob dx scleroderma and rec rx by rheumatology = shanahan with serial pfts/echo at [facility] while maintaining on plaquenil and nexium referred to pulmonary clinic 06/22/2015 by Dr Derenda Fennel Medicine with ? Asthma     History of Present Illness  06/22/2015 1st [facility] office visit/ Dajha Urquilla  @ [redacted] weeks gestation on ppi bid but not ac and saba but no longer ICS  Chief Complaint  Patient presents with  . Advice Only    Referred by [name]; Shortness of breath, thinks she may have asthma.   for long as she can remember when cold gets in chest gets severe symptoms of bad cough by codeine goes away eventually recurred in Dec 2016 > UC with cough/ sob / lost voice dx as asthma rx pred/proair and 100% better an no maint then March 2017 same thing  > UC eval rx one treatment and this resolved but started needing saba more but mostly just with exertion maybe once a day and never noct/ or early. Also loses breath talking.  saba helps  At baseline sob walking entire grocery store, never tried the saba first/ avg saba once daily Since onset IUP = slt more sob/ no increase in saba use  rec Pace yourself and walk slower to avoid becoming too short of breath as this is not healthy for oxygen delivery to the baby's oxygen level Only use your albuterol as a rescue medication Change nexium to Take 30- 60 min before your first and last meals of the day  GERD diet     07/21/2015  f/u ov/Wrenna Saks re:   IUP 16 weeks / mild asthma/ no saba need since last ov on gerd rx  Chief Complaint  Patient presents with  . Follow-up  Has learned to pace herself and no long sob rec No change in recommendations    Nov 29th 2017 c section then 3 days later swelling / hbp / sob > [facility]    Admit  date: 12/28/2015 Discharge date: 01/07/2016   HPI: per [name], [name] (patient initially admitted to [facility] hospital then transferred to [facility] ICU) [id number], 1 week s/p repeat CS here with multiple sx. She reports worsening LE edema about 3 days ago after she was discharged from the hospital. She reports onset of SOB last night. She then had onset of dizziness and HA today. She took Percocet and had no relief. She also reports upper abdominal pain bilateral that radiates to her back. Review of chart indicates pregnancy and postpartum course was uncomplicated.  Hospital Course: Discharge Diagnoses:  Active Problems:   Preeclampsia in postpartum period   Acute pulmonary edema (HCC)   Acute respiratory failure with hypoxia (HCC)   Dyspnea   SOB (shortness of breath)  Acute Hypoxic Respiratory Failure - patient admitted to [facility] hospital with hypoxic respiratory fialure due to pulmonary edema in the setting of pre-eclampsia on underlying ILD from scleroderma. She eventually was transferred to [facility] for critical care assistance. She is s/p C section on 11/29. She was aggressively diuresed and her hypoxia resolved and was weaned off to room air. She is net negative 20L and her weight has improved from 260 lbs on admission  to 232 lbs on discharge. She was able to ambulate in the hallway on room air maintaining her sats into the 90s without significant dyspnea. CTA on admission negative for PE. ECHO; normal Ef and with diastolic dysfunction. Sputum culture; growing few streptococcus, patient remain afebrile, WBC normal, discussed with CCM would not start antibiotics at this time.  Scleroderma - Continue with Plaquenil, she is followed by Dr. [name]  HTN - she is no longer hypertensive and off of medications    04/18/2016  f/u ov/Airon Sahni re: transiition oc care p admit/  Chronic cough on nexium bid ac in pt with scleroderma Chief Complaint  Patient presents with  .  Follow-up    Follow up per [name] for scleroderma, Pt here today c/o increase sob, finding it hard to catch her breath, has a non productive coug, slight wheezing at times, Denies fever  Stayed in bed x 8 weeks p [facility] and now 85% better    Using saba three times a week and no neb need at all  Cough/wheeze worse p exertion / not noct   Has not tried inhaler for cough  Doe = MMRC2 = can't walk a nl pace on a flat grade s sob but does fine slow and flat eg shopping  rec Plan A = Automatic = symbicort 80 Take 2 puffs first thing in am and then another 2 puffs about 12 hours later.  Work on inhaler technique:    Plan B = Backup Only use your albuterol as a rescue medication  Plan C = Crisis - only use your albuterol nebulizer if you first try Plan B and it fails to help > ok to use the nebulizer up to every 4 hours but if start needing it regularly call for immediate appointment  Please schedule a follow up visit in 3 months but call sooner if needed with PFT on return       07/20/2016  f/u ov/Langston [place] re: worsening asthma off symbicort  Chief Complaint  Patient presents with  . Follow-up    PFT's today. Pt states having increased SOB, wheezing and cough- started approx 1 wk ago while at [employer]. She states she has been using her albuterol inhaler 4-5 x per day and neb 2 x daily since having these symptoms.   after symbicort completed  around 1st May  Stopped it thru June 20th and did  [place] =  95% better - then drove to [place] and on first day more doe in heat walking all over [place]  then noct wheeze much better p albuterol  And since then much more saba need assoc with mostly dry coughing fits 07/17/16 eval [name], d dimer  0.6 > restarted pred and 75% improved since then  rec Plan A = Automatic = symbicort 80 (dulera 100 samples) Take 2 puffs first thing in am and then another 2 puffs about 12 hours later.  Work on inhaler technique:     Plan B = Backup Only  use your albuterol as a rescue medication  Plan C = Crisis - only use your albuterol nebulizer if you first try Plan B and it fails to help > ok to use the nebulizer up to every 4 hours but if start needing it regularly call for immediate appointment    05/09/2017 acute extended ov/Christifer Chapdelaine re: scleroderma with worse sob /dry cough  maint on symbicort 80 2bid  Chief Complaint  Patient presents with  . Acute Visit  Cough and SOB x 1 wk. Cough has not been prod. She states she feels SOB with or without any exertion.   baseline  g[place] parking lot >  [place]  Much worse since  05/02/17 and rescue helps/ neb helps more assoc with throat fullness and urge to clear throat but cough is dry and day > noct  On nexium bid ac maint Some better with saba  rec Prednisone 10 mg take  4 each am x 2 days,   2 each am x 2 days,  1 each am x 2 days and stop  If better and stay better > keep appt for August 2019 pfts for scleoderma  If not better, call to do pfts sooner  If better and then get worse, call for double strength symbicort  GERD diet     10/02/2017  f/u ov/Katrinna Travieso re: scleroderma/ prob cough variant asthma vs uacs Chief Complaint  Patient presents with  . Follow-up    WANTS FLU SHOT TODAY;Review PFT: Pt state she is doing pretty good-past few days has had to use her rescue inhaler-one of those happened during the night.   Dyspnea:  MMRC2 = can't walk a nl pace on a flat grade s sob but does fine slow and flat  Cough: no Sleeping: on back on 3 pillows  SABA use: a couple times last week  02: no   rec Good work on your inhaler technique and no change on your medications Work on inhaler technique:   Nexium 40 mg Take 30- 60 min before your first and last meals of the day     10/03/2018  f/u ov/Juanette Urizar re: scleroderma on plaquenil per rheum in [place] / cough variant asthma - no recent flares of arthritis/sob  Chief Complaint  Patient presents with  . Mild persistent asthma in adult without  complication    Medications for asthma have been working well since last visit.  Dyspnea:  Better since increased symb to 160 2bid / able to work out but uses saba first  Cough: no Sleeping: on back one pillow SABA use: none 02: none  rec Only use your albuterol as a rescue medication  Work on inhaler technique:   Yearly f/u    10/05/2019  f/u ov/Chaitanya Amedee re:   scleroderma  With new cp x sev weeks / had 3rd covid moderna  Aug 24th 2021 Chief Complaint  Patient presents with  . Follow-up    reports intermittent chest pains at night when lying down. denies increase shortness of breath   Dyspnea:  Still working out and not needing any albuterol  Cough: sometimes cough with work out  Sleeping: bed is flat with two pillow  SABA use: none  02: none  Midline ant cp x sev weeks x minutes to maybe an hour  not radiating and then resolves on its own no diaphor, n or v and never with kick boxing    No obvious day to day or daytime variability or assoc excess/ purulent sputum or mucus plugs or hemoptysis or cp or chest tightness, subjective wheeze or overt sinus or hb symptoms.     Also denies any obvious fluctuation of symptoms with weather or environmental changes or other aggravating or alleviating factors except as outlined above   No unusual exposure hx or h/o childhood pna/ asthma or knowledge of premature birth.  Current Allergies, Complete Past Medical History, Past Surgical History, Family History, and Social History were reviewed in [employer] record.  ROS  The following are not active complaints unless bolded Hoarseness, sore throat, dysphagia, dental problems, itching, sneezing,  nasal congestion or discharge of excess mucus or purulent secretions, ear ache,   fever, chills, sweats, unintended wt loss or wt gain, classically pleuritic or exertional cp,  orthopnea pnd or arm/hand swelling  or leg swelling, presyncope, palpitations, abdominal pain, anorexia,  nausea, vomiting, diarrhea  or change in bowel habits or change in bladder habits, change in stools or change in urine, dysuria, hematuria,  rash, arthralgias, visual complaints, headache, numbness, weakness or ataxia or problems with walking or coordination,  change in mood or  memory.        Current Meds  Medication Sig  . amLODipine (NORVASC) 10 MG tablet Take 10 mg by mouth daily.  . betamethasone dipropionate (DIPROLENE) 0.05 % ointment   . budesonide-formoterol (SYMBICORT) 160-4.5 MCG/ACT inhaler Inhale 2 puffs into the lungs 2 (two) times daily.  . Cholecalciferol (VITAMIN D) 2000 UNITS tablet Take 2,000 Units by mouth daily.  . Cholecalciferol (VITAMIN D3) 50 MCG (2000 UT) capsule Take by mouth.  . diclofenac Sodium (VOLTAREN) 1 % GEL Voltaren 1 % topical gel  . doxycycline (VIBRA-TABS) 100 MG tablet Take 1 tablet (100 mg total) by mouth 2 (two) times daily.  Marland Kitchen esomeprazole (NEXIUM) 40 MG capsule Take 30- 60 min before your first and last meals of the day (Patient taking differently: Take 40 mg by mouth 2 (two) times daily before a meal. Take 30- 60 min before your first and last meals of the day)  . HYDROcodone-acetaminophen (NORCO/VICODIN) 5-325 MG tablet TAKE 1   2 TABLET BY ORAL ROUTE EVERY 8 HOURS AS NEEDED FOR PAIN AS NEEDED(28 DAY SUPPLY)  . hydroxychloroquine (PLAQUENIL) 200 MG tablet Take 200 mg by mouth 3 (three) times daily. Pt takes 2 tablets 400mg  in the morning and one tablet 200mg  at night  . ibuprofen (ADVIL,MOTRIN) 800 MG tablet Take 800 mg by mouth every 8 (eight) hours as needed.  Marland Kitchen ipratropium-albuterol (DUONEB) 0.5-2.5 (3) MG/3ML SOLN Take 3 mLs by nebulization 3 (three) times daily. (Patient taking differently: Take 3 mLs by nebulization 3 (three) times daily as needed. )  . NIFEdipine (ADALAT CC) 30 MG 24 hr tablet Take by mouth.  . pentoxifylline (TRENTAL) 400 MG CR tablet Take 400 mg by mouth 3 (three) times daily.  . prazosin (MINIPRESS) 1 MG capsule Take 1 mg by  mouth 3 (three) times daily.  . Prenat-FeFmCb-DSS-FA-DHA w/o A (CITRANATAL HARMONY) 27-1-260 MG CAPS Take 1 tablet by mouth daily.  . Prenatal 27-1 MG TABS Take 1 tablet by mouth daily.  Marland Kitchen PROAIR HFA 108 (90 Base) MCG/ACT inhaler Inhale 2 puffs into the lungs every 4 (four) hours as needed for wheezing or shortness of breath.  . valACYclovir (VALTREX) 500 MG tablet Take 500 mg by mouth daily.                         Objective:   Physical Exam    10/05/2019        248  10/03/2018        245  10/02/2017        246 05/09/2017        250   04/18/2016      236   06/22/15 268 lb 6.4 oz (121.745 kg)  06/24/14 250 lb (113.399 kg)  11/03/12 243 lb (110.224 kg)    Vital signs reviewed  10/05/2019  - Note  at rest 02 sats  99% on RA        amb bf nad   HEENT : pt wearing mask not removed for exam due to covid -19 concerns.    NECK :  without JVD/Nodes/TM/ nl carotid upstrokes bilaterally   LUNGS: no acc muscle use,  Nl contour chest which is clear to A and P bilaterally without cough on insp or exp maneuvers   CV:  RRR  no s3 or murmur or increase in P2, and no edema   ABD:  soft and nontender with nl inspiratory excursion in the supine position. No bruits or organomegaly appreciated, bowel sounds nl  MS:  Nl gait/ ext warm without deformities, calf tenderness, cyanosis or clubbing  cannot straighten fingers    SKIN: warm and dry without lesions    NEURO:  alert, approp, nl sensorium with  no motor or cerebellar deficits apparent.      CXR PA and Lateral:   10/05/2019 :    I personally reviewed images and agree with radiology impression as follows:   Similar appearance of bilateral reticular opacities in the lung bases and right upper lung, consistent with usual interstitial pneumonia.     Assessment & Plan:

## 2019-10-05 NOTE — Patient Instructions (Addendum)
Nexium Take 30-60 min before first meal of the day   GERD (REFLUX)  is an extremely common cause of respiratory symptoms just like yours , many times with no obvious heartburn at all.    It can be treated with medication, but also with lifestyle changes including elevation of the head of your bed (ideally with 6 -8inch blocks under the headboard of your bed),  Smoking cessation, avoidance of late meals, excessive alcohol, and avoid fatty foods, chocolate, peppermint, colas, red wine, and acidic juices such as orange juice.  NO MINT OR MENTHOL PRODUCTS SO NO COUGH DROPS  USE SUGARLESS CANDY INSTEAD (Jolley ranchers or Stover's or Life Savers) or even ice chips will also do - the key is to swallow to prevent all throat clearing. NO OIL BASED VITAMINS - use powdered substitutes.  Avoid fish oil when coughing.   Make sure you check your oxygen saturations at highest level of activity to be sure it stays over 90% and keep track of it at least once a week, more often if breathing getting worse, and let me know if losing ground.    Please schedule a follow up office visit in 6 weeks, call sooner if needed with pfts

## 2019-10-06 ENCOUNTER — Encounter: Payer: Self-pay | Admitting: Internal Medicine

## 2019-10-06 DIAGNOSIS — R0789 Other chest pain: Secondary | ICD-10-CM | POA: Insufficient documentation

## 2019-10-06 NOTE — Assessment & Plan Note (Signed)
PFT's  01/28/15    FEV1 2.02 (60 % ) ratio 65   with DLCO  47 % corrects to 68 % for alv volume - 04/18/2016   try symb 80 2bid  > d/c May 22 2016 and flared off symbicort at [facility] June 2018  - PFT's  07/20/2016  FEV1 1.90 (57 % ) ratio 76  p 9 % improvement from saba p nothing prior to study (while on pred) with DLCO  42 % corrects to 67  % for alv volume   - PFT's  10/02/2017  FEV1 2.15  (65 % ) ratio 73  p no % improvement from saba p symb 80 x 2  prior to study with DLCO  41 % corrects to 60 % for alv volume   - 10/03/2018  After extensive coaching inhaler device,  effectiveness =  75%     All goals of chronic asthma control met including optimal function and elimination of symptoms with minimal need for rescue therapy maint on symb 160 2bid   Contingencies discussed in full including contacting this office immediately if not controlling the symptoms using the rule of two's.

## 2019-10-06 NOTE — Assessment & Plan Note (Signed)
Onset summer 2021 mostly noct  > rec nexium 40 mg q d ac and bed blocks/ add pepcid hs otc if needed   Nothing to suggest a cardiac or pulmonary source so that leaves gerd to which she is prone from scleroderma > consider GI w/u if not responding  Medical decision making was a moderate level of complexity in this case because of  two chronic conditions /diagnoses requiring extra time for  H and P, chart review, counseling,  directly observing portions of ambulatory 02 saturation study/  and generating customized AVS unique to this office visit and charting.   Each maintenance medication was reviewed in detail including emphasizing most importantly the difference between maintenance and prns and under what circumstances the prns are to be triggered using an action plan format where appropriate. Please see avs for details which were reviewed in writing by both me and my nurse and patient given a written copy highlighted where appropriate with yellow highlighter for the patient's continued care at home along with an updated version of their medications.  Patient was asked to maintain medication reconciliation by comparing this list to the actual medications being used at home and to contact this office right away if there is a conflict or discrepancy.

## 2019-10-06 NOTE — Assessment & Plan Note (Signed)
Initial symptom 2006, dx [facility] 2007 - f/u Dr Chilton Greathouse  - HRCT 07/08/13 1. Marked progression of interstitial lung disease. Given the strong craniocaudal gradient, marked progression compared to prior study from 08/03/2004, and presence of honeycombing in the lower lobes of the lungs bilaterally, this pattern is compatible with usual interstitial pneumonia (UIP), presumably a manifestation of the patient's underlying scleroderma. 2. Dilated pulmonic trunk (4 cm in diameter), suggestive of pulmonary arterial hypertension. 3. Dilated esophagus presumably secondary to scleroderma. - Echo 01/28/15 s PH - 06/22/2015  Walked RA x 3 laps @ 185 ft each stopped due to end of study, nl pace, no significant desat or sob.  - symptoms improved on gerd rx 07/21/2015 despite additional month of gestation - CT w/o contrast  Chest  02/10/16  1. Near complete resolution of the extensive patchy areas of ground-glass attenuation seen in both lungs on the 12/30/2015 chest CT, most consistent with nearly resolved pulmonary edema. 2. Trace residual left pleural effusion is decreased. 3. Stable top-normal heart size. Stable chronically dilated main pulmonary artery, likely indicating chronic pulmonary arterial hypertension. 4. Basilar predominant fibrotic interstitial lung disease with honeycombing, mildly progressed since 07/08/2013 high-resolution chest CT study, compatible with usual interstitial pneumonia (UIP) due to scleroderma. - Echo 02/09/16 no PAH  - PFT's  02/09/16  FVC  2.54 (62%) with dloc 35% corrects to 62 % for alv vol  - PFT's  09/03/16  FVC  2.76 (68%)  with DLCO  37 % corrects to 61 % for alv volume    - PFT's  10/02/2017  FVC 2.95    with DLCO  41 % corrects to 60 % for alv volume   -  10/05/2019   Walked RA  2 laps @ approx 257ft each @ fast pace  stopped due to end of study no sob and sast 99% at end    No eviedenc of dz progression/ no indication for antifibrotics at this point  - best  way to follow is serial sats with exertion/ advised

## 2019-10-12 ENCOUNTER — Other Ambulatory Visit: Payer: Self-pay

## 2019-10-12 ENCOUNTER — Ambulatory Visit
Admission: RE | Admit: 2019-10-12 | Discharge: 2019-10-12 | Disposition: A | Discharge status: Home / Self Care | Payer: Medicare Other | Source: Ambulatory Visit | Attending: Obstetrics and Gynecology | Admitting: Obstetrics and Gynecology

## 2019-10-12 DIAGNOSIS — Z1231 Encounter for screening mammogram for malignant neoplasm of breast: Secondary | ICD-10-CM

## 2019-11-04 ENCOUNTER — Emergency Department (HOSPITAL_COMMUNITY)
Admission: EM | Admit: 2019-11-04 | Discharge: 2019-11-04 | Disposition: A | Discharge status: Home / Self Care | Payer: Medicare Other | Attending: Emergency Medicine | Admitting: Emergency Medicine

## 2019-11-04 ENCOUNTER — Emergency Department (HOSPITAL_COMMUNITY): Payer: Medicare Other

## 2019-11-04 ENCOUNTER — Encounter (HOSPITAL_COMMUNITY): Payer: Self-pay | Admitting: Emergency Medicine

## 2019-11-04 DIAGNOSIS — I509 Heart failure, unspecified: Secondary | ICD-10-CM | POA: Insufficient documentation

## 2019-11-04 DIAGNOSIS — Z7951 Long term (current) use of inhaled steroids: Secondary | ICD-10-CM | POA: Insufficient documentation

## 2019-11-04 DIAGNOSIS — E119 Type 2 diabetes mellitus without complications: Secondary | ICD-10-CM | POA: Diagnosis not present

## 2019-11-04 DIAGNOSIS — Z79899 Other long term (current) drug therapy: Secondary | ICD-10-CM | POA: Insufficient documentation

## 2019-11-04 DIAGNOSIS — J4530 Mild persistent asthma, uncomplicated: Secondary | ICD-10-CM | POA: Diagnosis not present

## 2019-11-04 DIAGNOSIS — R0789 Other chest pain: Secondary | ICD-10-CM

## 2019-11-04 DIAGNOSIS — I110 Hypertensive heart disease with heart failure: Secondary | ICD-10-CM | POA: Diagnosis not present

## 2019-11-04 LAB — BASIC METABOLIC PANEL
Anion gap: 11 (ref 5–15)
BUN: 12 mg/dL (ref 6–20)
CO2: 25 mmol/L (ref 22–32)
Calcium: 9.7 mg/dL (ref 8.9–10.3)
Chloride: 102 mmol/L (ref 98–111)
Creatinine, Ser: 0.87 mg/dL (ref 0.44–1.00)
GFR, Estimated: >60 mL/min (ref >60)
Glucose, Bld: 100 mg/dL — ABNORMAL HIGH (ref 70–99)
Potassium: 4 mmol/L (ref 3.5–5.1)
Sodium: 138 mmol/L (ref 135–145)

## 2019-11-04 LAB — CBC
HCT: 43.6 % (ref 36.0–46.0)
Hemoglobin: 12.9 g/dL (ref 12.0–15.0)
MCH: 22.8 pg — ABNORMAL LOW (ref 26.0–34.0)
MCHC: 29.6 g/dL — ABNORMAL LOW (ref 30.0–36.0)
MCV: 77 fL — ABNORMAL LOW (ref 80.0–100.0)
Platelets: 227 10*3/uL (ref 150–400)
RBC: 5.66 MIL/uL — ABNORMAL HIGH (ref 3.87–5.11)
RDW: 14.9 % (ref 11.5–15.5)
WBC: 5.6 10*3/uL (ref 4.0–10.5)
nRBC: 0 % (ref 0.0–0.2)

## 2019-11-04 LAB — I-STAT BETA HCG BLOOD, ED (MC, WL, AP ONLY): I-stat hCG, quantitative: <5 m[IU]/mL (ref <5)

## 2019-11-04 LAB — D-DIMER, QUANTITATIVE: D-Dimer, Quant: 0.68 ug/mL-FEU — ABNORMAL HIGH (ref 0.00–0.50)

## 2019-11-04 LAB — TROPONIN I (HIGH SENSITIVITY)
Troponin I (High Sensitivity): 5 ng/L (ref <18)
Troponin I (High Sensitivity): 5 ng/L (ref <18)

## 2019-11-04 MED ORDER — ALUM & MAG HYDROXIDE-SIMETH 200-200-20 MG/5ML PO SUSP
15.0000 mL | Freq: Once | ORAL | Status: AC
  Administered 2019-11-04: 15 mL via ORAL
  Filled 2019-11-04: qty 30

## 2019-11-04 MED ORDER — IOHEXOL 350 MG/ML SOLN
75.0000 mL | Freq: Once | INTRAVENOUS | Status: AC | PRN
  Administered 2019-11-04: 75 mL via INTRAVENOUS

## 2019-11-04 MED ORDER — KETOROLAC TROMETHAMINE 30 MG/ML IJ SOLN: 30.0000 mg | Freq: Once | INTRAMUSCULAR | Status: DC

## 2019-11-04 MED ORDER — ALUM & MAG HYDROXIDE-SIMETH 200-200-20 MG/5ML PO SUSP: 15.0000 mL | Freq: Once | ORAL | Status: DC

## 2019-11-04 MED ORDER — NAPROXEN 500 MG PO TABS: 500.0000 mg | ORAL_TABLET | Freq: Two times a day (BID) | ORAL | 0 refills | Status: DC

## 2019-11-04 MED ORDER — KETOROLAC TROMETHAMINE 30 MG/ML IJ SOLN
30.0000 mg | Freq: Once | INTRAMUSCULAR | Status: AC
  Administered 2019-11-04: 30 mg via INTRAVENOUS
  Filled 2019-11-04: qty 1

## 2019-11-04 NOTE — ED Provider Notes (Signed)
[facility] EMERGENCY DEPARTMENT Provider Note   CSN: [id number] Arrival date & time: 11/04/19  1016     History Chief Complaint  Patient presents with  . Chest Pain    Laura Rowland is a 44 y.o. female with history of systemic scleroderma who presents with sudden onset chest heaviness that started at 11:30 PM last night.  She states that she has not had shortness of breath per se, she feels that she cannot get a deep breath without exerting more effort than normal.  Her chest heaviness is worse when she lies flat or on her right side; improves when she leans forward or to her left.   She endorses palpitations, however states that some of her medications for scleroderma have side effect of palpitations, this is normal for her.  Denies congestion, sore throat, cough, nausea, vomiting, diarrhea, fevers, chills.  She has been vaccinated against COVID-19, and has received her third booster dose. Patient denies recent travel, immobilization, hormone therapy.  She endorses foot surgery on her right foot in March 2021.  She is not on any anticoagulation.  I personally reviewed this patient's medical records.  Scleroderma affecting the lungs, Raynaud's, calcinosis, spasm esophagus -combination of these consistent with CREST syndrome.  Additional history of type 2 diabetes, pulmonary arterial hypertension.  Echocardiogram 01/2019 with LVEF 60 to 65%; mild pulmonary regurg, mildly elevated pulmonary artery systolic pressure.    HPI     Past Medical History:  Diagnosis Date  . Anemia   . Scleredema [facility])     Patient Active Problem List   Diagnosis Date Noted  . Atypical chest pain 10/06/2019  . Nonhealing skin ulcer ([place]) 04/27/2019  . Finger ulcer ([place]) 04/23/2019  . Heart failure ([place]) 03/04/2019  . Type 2 diabetes mellitus ([place]) 03/04/2019  . Calcinosis 03/03/2018  . Tenosynovitis 10/29/2016  . Central centrifugal scarring alopecia 10/16/2016  . Pulmonary  arterial hypertension ([place]) 07/10/2016  . Morbid obesity due to excess calories ([place]) 04/19/2016  . Dyspnea   . SOB (shortness of breath)   . Acute respiratory failure with hypoxia ([place]) 12/31/2015  . Acute pulmonary edema ([facility])   . Preeclampsia in postpartum period 12/28/2015  . S/P cesarean section 12/21/2015  . Advanced maternal age in multigravida 06/28/2015  . Scleroderma involving lung ([place]) 06/22/2015  . Mild persistent asthma in adult without complication 19/50/9326  . Alopecia areata 01/05/2015  . Miscarriage 07/22/2014  . Taking multiple medications for chronic disease 11/04/2013  . Varicose veins of lower extremities with other complications 71/24/5809  . Diffuse spasm of esophagus 10/04/2011  . Raynaud's disease 10/04/2011    Past Surgical History:  Procedure Laterality Date  . BREAST BIOPSY Right   . BREAST CYST EXCISION Left 1996   x2  . CESAREAN SECTION    . CESAREAN SECTION N/A 12/21/2015   Procedure: CESAREAN SECTION;  Surgeon: Christophe Louis, MD;  Location: [facility];  Service: Obstetrics;  Laterality: N/A;  . CYST REMOVAL HAND  2008  . DILATION AND EVACUATION N/A 06/28/2014   Procedure: DILATATION AND EVACUATION;  Surgeon: Christophe Louis, MD;  Location: [facility];  Service: Gynecology;  Laterality: N/A;  . WISDOM TOOTH EXTRACTION       OB History    Gravida  4   Para  2   Term  1   Preterm  1   AB  2   Living  2     SAB  1   TAB  1  Ectopic      Multiple  0   Live Births  2           Family History  Problem Relation Age of Onset  . Diabetes Father   . Breast cancer Neg Hx     Social History   Tobacco Use  . Smoking status: Never Smoker  . Smokeless tobacco: Never Used  Vaping Use  . Vaping Use: Never used  Substance Use Topics  . Alcohol use: No    Comment: rarely  . Drug use: No    Home Medications Prior to Admission medications   Medication Sig Start Date End Date Taking? Authorizing Provider  amLODipine (NORVASC) 10  MG tablet Take 10 mg by mouth daily. 05/20/19   [provider]  betamethasone dipropionate (DIPROLENE) 0.05 % ointment  02/24/18   [provider]  budesonide-formoterol (SYMBICORT) 160-4.5 MCG/ACT inhaler Inhale 2 puffs into the lungs 2 (two) times daily. 10/03/18   Tanda Rockers, MD  budesonide-formoterol (SYMBICORT) 80-4.5 MCG/ACT inhaler Inhale 2 puffs into the lungs 2 (two) times daily. 02/27/18 05/28/18  Tanda Rockers, MD  Cholecalciferol (VITAMIN D) 2000 UNITS tablet Take 2,000 Units by mouth daily.    [provider]  Cholecalciferol (VITAMIN D3) 50 MCG (2000 UT) capsule Take by mouth.    [provider]  diclofenac Sodium (VOLTAREN) 1 % GEL Voltaren 1 % topical gel    [provider]  doxycycline (VIBRA-TABS) 100 MG tablet Take 1 tablet (100 mg total) by mouth 2 (two) times daily. 08/03/19   Wallene Huh, DPM  esomeprazole (NEXIUM) 40 MG capsule Take 30- 60 min before your first and last meals of the day Patient taking differently: Take 40 mg by mouth 2 (two) times daily before a meal. Take 30- 60 min before your first and last meals of the day 06/22/15   Tanda Rockers, MD  HYDROcodone-acetaminophen (NORCO/VICODIN) 5-325 MG tablet TAKE 1   2 TABLET BY ORAL ROUTE EVERY 8 HOURS AS NEEDED FOR PAIN AS NEEDED(28 DAY SUPPLY) 04/23/19   [provider]  hydroxychloroquine (PLAQUENIL) 200 MG tablet Take 200 mg by mouth 3 (three) times daily. Pt takes 2 tablets 400mg  in the morning and one tablet 200mg  at night    [provider]  ibuprofen (ADVIL,MOTRIN) 800 MG tablet Take 800 mg by mouth every 8 (eight) hours as needed.    [provider]  ipratropium-albuterol (DUONEB) 0.5-2.5 (3) MG/3ML SOLN Take 3 mLs by nebulization 3 (three) times daily. Patient taking differently: Take 3 mLs by nebulization 3 (three) times daily as needed.  01/07/16   Caren Griffins, MD  NIFEdipine (ADALAT CC) 30 MG 24 hr tablet Take by mouth. 04/23/19  04/22/20  [provider]  pentoxifylline (TRENTAL) 400 MG CR tablet Take 400 mg by mouth 3 (three) times daily. 05/20/19   [provider]  prazosin (MINIPRESS) 1 MG capsule Take 1 mg by mouth 3 (three) times daily. 05/20/19   [provider]  Prenat-FeFmCb-DSS-FA-DHA w/o A (CITRANATAL HARMONY) 27-1-260 MG CAPS Take 1 tablet by mouth daily. 12/25/17   [provider]  Prenatal 27-1 MG TABS Take 1 tablet by mouth daily. 05/15/19   [provider]  PROAIR HFA 108 (90 Base) MCG/ACT inhaler Inhale 2 puffs into the lungs every 4 (four) hours as needed for wheezing or shortness of breath. 01/07/16   Caren Griffins, MD  valACYclovir (VALTREX) 500 MG tablet Take 500 mg by mouth daily.  [provider]    Allergies    Patient has no known allergies.  Review of Systems   Review of Systems  Constitutional: Negative for chills, diaphoresis, fatigue and fever.  HENT: Negative.   Respiratory: Positive for shortness of breath. Negative for cough and chest tightness.   Cardiovascular: Positive for chest pain, palpitations and leg swelling.       Palpitations, swelling in legs not new; related to scleroderma running and associated medications.  Gastrointestinal: Negative for abdominal pain, diarrhea, nausea and vomiting.  Endocrine: Negative.   Genitourinary: Negative for dysuria and hematuria.  Musculoskeletal: Negative.        Patient unable to fully extend the fingers bilaterally, secondary to scleroderma.  Skin: Negative.   Allergic/Immunologic: Positive for immunocompromised state.       Scleroderma  Neurological: Negative for syncope, facial asymmetry, weakness, light-headedness, numbness and headaches.    Physical Exam Updated Vital Signs BP (!) 147/81 (BP Location: Right Arm)   Pulse 70   Temp 98.3 F (36.8 C) (Oral)   Resp 16   SpO2 100%   Physical Exam Vitals and nursing note reviewed.  HENT:     Head: Normocephalic and  atraumatic.     Mouth/Throat:     Mouth: Mucous membranes are moist.     Pharynx: No oropharyngeal exudate or posterior oropharyngeal erythema.  Eyes:     General:        Right eye: No discharge.        Left eye: No discharge.     Conjunctiva/sclera: Conjunctivae normal.     Pupils: Pupils are equal, round, and reactive to light.  Neck:     Trachea: No tracheal deviation.  Cardiovascular:     Rate and Rhythm: Normal rate and regular rhythm.  No extrasystoles are present.    Pulses:          Radial pulses are 2+ on the right side and 2+ on the left side.       Dorsalis pedis pulses are detected w/ Doppler on the right side and detected w/ Doppler on the left side.     Heart sounds: Normal heart sounds. No murmur heard.  No gallop.   Pulmonary:     Effort: Pulmonary effort is normal. No tachypnea or respiratory distress.     Breath sounds: Normal breath sounds. No wheezing or rales.     Comments: Coarse breath sounds bilaterally in lower lobes, consistent with known scleroderma related basilar interstitial lung disease. Chest:     Chest wall: No mass, deformity, tenderness, crepitus or edema.  Abdominal:     General: There is no distension.     Palpations: Abdomen is soft. There is no mass.     Tenderness: There is no abdominal tenderness. There is no rebound.  Musculoskeletal:        General: No deformity.     Cervical back: Neck supple.     Right lower leg: Edema present.     Left lower leg: Edema present.     Comments: Bilateral hands with limited finger extension, secondary to scleroderma.  Lymphadenopathy:     Cervical: No cervical adenopathy.  Skin:    General: Skin is warm and dry.  Neurological:     General: No focal deficit present.     Mental Status: She is alert and oriented to person, place, and time. Mental status is at baseline.  Psychiatric:        Mood and Affect: Mood normal.  ED Results / Procedures / Treatments   Labs (all labs ordered are listed,  but only abnormal results are displayed) Labs Reviewed  BASIC METABOLIC PANEL - Abnormal; Notable for the following components:      Result Value   Glucose, Bld 100 (*)    All other components within normal limits  CBC - Abnormal; Notable for the following components:   RBC 5.66 (*)    MCV 77.0 (*)    MCH 22.8 (*)    MCHC 29.6 (*)    All other components within normal limits  D-DIMER, QUANTITATIVE (NOT AT [facility])  I-STAT BETA HCG BLOOD, ED ([facility], [facility], [facility] ONLY)  TROPONIN I (HIGH SENSITIVITY)  TROPONIN I (HIGH SENSITIVITY)    EKG EKG Interpretation  Date/Time:  Wednesday November 04 2019 10:37:45 EDT Ventricular Rate:  76 PR Interval:  150 QRS Duration: 98 QT Interval:  390 QTC Calculation: 438 R Axis:   16 Text Interpretation: Normal sinus rhythm Biatrial enlargement Septal infarct , age undetermined Abnormal ECG No STEMI Confirmed by Nanda Quinton [phone]) on 11/04/2019 12:21:09 PM   Radiology DG Chest 2 View  Result Date: 11/04/2019 CLINICAL DATA:  Chest pain EXAM: CHEST - 2 VIEW COMPARISON:  10/05/2019, 09/03/2016 FINDINGS: The heart size and mediastinal contours are within normal limits. No interval change in the appearance of basilar predominant interstitial lung disease and right upper lobe scarring. No new focal airspace consolidation. No pleural effusion or pneumothorax. The visualized skeletal structures are unremarkable. IMPRESSION: 1. No acute cardiopulmonary process. 2. Chronic basilar predominant interstitial lung disease and right upper lobe scarring. Electronically Signed   By: Davina Poke D.O.   On: 11/04/2019 11:19    Procedures Procedures (including critical care time)  Medications Ordered in ED Medications  ketorolac (TORADOL) 30 MG/ML injection 30 mg (has no administration in time range)  alum & mag hydroxide-simeth (MAALOX/MYLANTA) 200-200-20 MG/5ML suspension 15 mL (has no administration in time range)    ED Course  I have reviewed the triage vital  signs and the nursing notes.  Pertinent labs & imaging results that were available during my care of the patient were reviewed by me and considered in my medical decision making (see chart for details).    MDM Rules/Calculators/A&P                          Differential diagnosis for this patient chest tightness includes but limited to ACS, PE, aortic dissection, pneumonia, pulmonary edema, pericarditis, gastritis, esophagitis, scleroderma related complication (patient with known lung scarring secondary to systemic scleroderma/ CREST), muscular pain.   CBC with mildly elevated RBC 5.66.   BMP unremarkable.   Initial troponin negative, 5.   CXR: No acute cardiopulmonary process.  Chronic bibasilar predominant interstitial lung disease and right upper lobe scarring.  EKG with normal sinus rhythm, no ST elevation, no signs of pericarditis (this is on the differential as the patient's chest pressure/pain is positional)   Thus far work-up and physical exam are reassuring.  Patient is PERC negative. Given hx of autoimmune disease, sudden onset of patient's symptoms will obtain D-dimer.  Second troponin pending.   Toradol, Maalox offered for discomfort.  Initial D-dimer sample hemolyzed, was canceled by lab staff.  D-dimer was redrawn and is pending at time of shift change.  Emergent causes of this patient's symptoms at this point in the work-up primarily includes pulmonary embolus versus less urgent gastritis/esophagitis.  D-dimer, repeat troponin pending.  Care of this  patient was signed out to oncoming PA, Alecia Lemming, who is agreeable to complete this patient's work-up and treatment plan.     Final Clinical Impression(s) / ED Diagnoses Final diagnoses:  None    Rx / DC Orders ED Discharge Orders    None       Aura Dials 11/04/19 1528    Long, Wonda Olds, MD 11/05/19 0800

## 2019-11-04 NOTE — Discharge Instructions (Signed)
Please read and follow all provided instructions.  Your diagnoses today include:  1. Atypical chest pain     Tests performed today include:  An EKG of your heart  A chest x-ray  Cardiac enzymes - a blood test for heart muscle damage  Blood counts and electrolytes  D-dimer - screening test for blood clots was slightly high  CT of the chest - shows no blood clots or pneumonia, a gallstone in the gallbladder, slight worsening of lung disease  Vital signs. See below for your results today.   Medications prescribed:   Naproxen - anti-inflammatory pain medication  Do not exceed 500mg  naproxen every 12 hours, take with food  You have been prescribed an anti-inflammatory medication or NSAID. Take with food. Take smallest effective dose for the shortest duration needed for your pain. Stop taking if you experience stomach pain or vomiting.   Take any prescribed medications only as directed.  Follow-up instructions: Please follow-up with your primary care provider as soon as you can for further evaluation of your symptoms.   Return instructions:  SEEK IMMEDIATE MEDICAL ATTENTION IF:  You have severe chest pain, especially if the pain is crushing or pressure-like and spreads to the arms, back, neck, or jaw, or if you have sweating, nausea (feeling sick to your stomach), or shortness of breath. THIS IS AN EMERGENCY. Don't wait to see if the pain will go away. Get medical help at once. Call 911 or 0 (operator). DO NOT drive yourself to the hospital.   Your chest pain gets worse and does not go away with rest.   You have an attack of chest pain lasting longer than usual, despite rest and treatment with the medications your caregiver has prescribed.   You wake from sleep with chest pain or shortness of breath.  You feel dizzy or faint.  You have chest pain not typical of your usual pain for which you originally saw your caregiver.   You have any other emergent concerns regarding  your health.  Additional Information: Chest pain comes from many different causes. Your caregiver has diagnosed you as having chest pain that is not specific for one problem, but does not require admission.  You are at low risk for an acute heart condition or other serious illness.   Your vital signs today were: BP (!) 147/81 (BP Location: Right Arm)   Pulse 70   Temp 98.3 F (36.8 C) (Oral)   Resp 16   SpO2 100%  If your blood pressure (BP) was elevated above 135/85 this visit, please have this repeated by your doctor within one month. --------------

## 2019-11-04 NOTE — ED Provider Notes (Signed)
3:38 PM Signout from [employer] at shift change.   Presents today with CP. EKG okay. CXR clear. Somewhat positional.   Plan: Pending d-dimer. If neg, home with NSAIDs. She was given GI cocktail here.   3:47 PM D-dimer is elevated. CT angio chest ordered.   3:58 PM Pt updated, aware of CT.   BP (!) 147/81 (BP Location: Right Arm)   Pulse 70   Temp 98.3 F (36.8 C) (Oral)   Resp 16   SpO2 100%   5:40 PM Pt updated on results, including no blood clots, gallstone, sleep versus interstitial lung disease.  Patient has pulmonology follow-up.  She looks comfortable, no distress.   Discussed need for PCP follow-up if symptoms recur.   Patient was counseled to return with severe chest pain, especially if the pain is crushing or pressure-like and spreads to the arms, back, neck, or jaw, or if they have sweating, nausea, or shortness of breath with the pain. They were encouraged to call 911 with these symptoms.     Carlisle Cater, PA-C 11/04/19 1741    Drenda Freeze, MD 11/05/19 2038

## 2019-11-04 NOTE — ED Triage Notes (Signed)
Pt reports chest pain that began last night, denies sob. A/ox4, resp e/u.

## 2019-11-17 ENCOUNTER — Other Ambulatory Visit: Payer: Self-pay | Admitting: Internal Medicine

## 2019-11-17 DIAGNOSIS — M349 Systemic sclerosis, unspecified: Secondary | ICD-10-CM

## 2019-11-18 ENCOUNTER — Ambulatory Visit (INDEPENDENT_AMBULATORY_CARE_PROVIDER_SITE_OTHER): Payer: Medicare Other | Admitting: Internal Medicine

## 2019-11-18 ENCOUNTER — Other Ambulatory Visit: Payer: Self-pay

## 2019-11-18 ENCOUNTER — Encounter: Payer: Self-pay | Admitting: Internal Medicine

## 2019-11-18 ENCOUNTER — Ambulatory Visit: Payer: Medicare Other | Admitting: Internal Medicine

## 2019-11-18 DIAGNOSIS — J4530 Mild persistent asthma, uncomplicated: Secondary | ICD-10-CM

## 2019-11-18 DIAGNOSIS — M349 Systemic sclerosis, unspecified: Secondary | ICD-10-CM | POA: Diagnosis not present

## 2019-11-18 LAB — PULMONARY FUNCTION TEST
DL/VA % pred: 90 %
DL/VA: 3.77 ml/min/mmHg/L
DLCO cor % pred: 60 %
DLCO cor: 16.9 ml/min/mmHg
DLCO unc % pred: 59 %
DLCO unc: 16.64 ml/min/mmHg
FEF 25-75 Post: 1.58 L/sec
FEF 25-75 Pre: 1.27 L/sec
FEF2575-%Change-Post: 24 %
FEF2575-%Pred-Post: 47 %
FEF2575-%Pred-Pre: 37 %
FEV1-%Change-Post: 7 %
FEV1-%Pred-Post: 66 %
FEV1-%Pred-Pre: 61 %
FEV1-Post: 2.16 L
FEV1-Pre: 2.01 L
FEV1FVC-%Change-Post: 7 %
FEV1FVC-%Pred-Pre: 84 %
FEV6-%Change-Post: 0 %
FEV6-%Pred-Post: 73 %
FEV6-%Pred-Pre: 73 %
FEV6-Post: 2.88 L
FEV6-Pre: 2.88 L
FEV6FVC-%Pred-Post: 101 %
FEV6FVC-%Pred-Pre: 101 %
FVC-%Change-Post: 0 %
FVC-%Pred-Post: 71 %
FVC-%Pred-Pre: 71 %
FVC-Post: 2.88 L
FVC-Pre: 2.88 L
Post FEV1/FVC ratio: 75 %
Post FEV6/FVC ratio: 100 %
Pre FEV1/FVC ratio: 70 %
Pre FEV6/FVC Ratio: 100 %
RV % pred: 96 %
RV: 1.95 L
TLC % pred: 78 %
TLC: 4.91 L

## 2019-11-18 NOTE — Progress Notes (Signed)
PFT done today. 

## 2019-11-18 NOTE — Assessment & Plan Note (Signed)
PFT's  01/28/15    FEV1 2.02 (60 % ) ratio 65   with DLCO  47 % corrects to 68 % for alv volume - 04/18/2016   try symb 80 2bid  > d/c May 22 2016 and flared off symbicort at [facility] June 2018  - PFT's  07/20/2016  FEV1 1.90 (57 % ) ratio 76  p 9 % improvement from saba p nothing prior to study (while on pred) with DLCO  42 % corrects to 67  % for alv volume   - PFT's  10/02/2017  FEV1 2.15  (65 % ) ratio 73  p no % improvement from saba p symb 80 x 2  prior to study with DLCO  41 % corrects to 60 % for alv volume    - 11/18/2019  After extensive coaching inhaler device,  effectiveness =    90% > continue symbicort 160 2bid   - PFT's  11/18/2019  FEV1 2.16 (66 % ) ratio 0.75  p 7 % improvement from saba p ** prior to study with DLCO  16.64 (59%) corrects to 3.77 (89%)  for alv volume and FV curve classic mild concavity  All goals of chronic asthma control met including optimal function and elimination of symptoms with minimal need for rescue therapy.  Contingencies discussed in full including contacting this office immediately if not controlling the symptoms using the rule of two's.

## 2019-11-18 NOTE — Patient Instructions (Addendum)
No change symbicort   Make sure you check your oxygen saturations at highest level of activity to be sure it stays over 90% and keep track of it at least once a week, more often if breathing getting worse, and let me know if losing ground.   GERD (REFLUX)  is an extremely common cause of respiratory symptoms just like yours , many times with no obvious heartburn at all.    It can be treated with medication, but also with lifestyle changes including elevation of the head of your bed (ideally with 6-8inch blocks under the headboard of your bed),  Smoking cessation, avoidance of late meals, excessive alcohol, and avoid fatty foods, chocolate, peppermint, colas, red wine, and acidic juices such as orange juice.  NO MINT OR MENTHOL PRODUCTS SO NO COUGH DROPS  USE SUGARLESS CANDY INSTEAD (Jolley ranchers or Stover's or Life Savers) or even ice chips will also do - the key is to swallow to prevent all throat clearing. NO OIL BASED VITAMINS - use powdered substitutes.  Avoid fish oil when coughing.     Please schedule a follow up visit in 12 months but call sooner if needed

## 2019-11-18 NOTE — Progress Notes (Signed)
Subjective:   Patient ID: Laura Rowland, female    DOB: 06-Dec-1975     MRN: [id number]     Brief patient profile:  47  yobf substitute teacher  never smoker never asthma and [place] eval  2007 for sob dx scleroderma and rec rx by rheumatology = shanahan with serial pfts/echo at [facility] while maintaining on plaquenil and nexium referred to pulmonary clinic 06/22/2015 by Dr Derenda Fennel Medicine with ? Asthma     History of Present Illness  06/22/2015 1st [facility] office visit/ Laura Rowland  @ [redacted] weeks gestation on ppi bid but not ac and saba but no longer ICS  Chief Complaint  Patient presents with  . Advice Only    Referred by [name]; Shortness of breath, thinks she may have asthma.   for long as she can remember when cold gets in chest gets severe symptoms of bad cough by codeine goes away eventually recurred in Dec 2016 > UC with cough/ sob / lost voice dx as asthma rx pred/proair and 100% better an no maint then March 2017 same thing  > UC eval rx one treatment and this resolved but started needing saba more but mostly just with exertion maybe once a day and never noct/ or early. Also loses breath talking.  saba helps  At baseline sob walking entire grocery store, never tried the saba first/ avg saba once daily Since onset IUP = slt more sob/ no increase in saba use  rec Pace yourself and walk slower to avoid becoming too short of breath as this is not healthy for oxygen delivery to the baby's oxygen level Only use your albuterol as a rescue medication Change nexium to Take 30- 60 min before your first and last meals of the day  GERD diet     07/21/2015  f/u ov/Laura Rowland re:   IUP 16 weeks / mild asthma/ no saba need since last ov on gerd rx  Chief Complaint  Patient presents with  . Follow-up  Has learned to pace herself and no long sob rec No change in recommendations    Nov 29th 2017 c section then 3 days later swelling / hbp / sob > [facility]    Admit  date: 12/28/2015 Discharge date: 01/07/2016   HPI: per [name], [name] (patient initially admitted to [facility] hospital then transferred to [facility] ICU) [id number], 1 week s/p repeat CS here with multiple sx. She reports worsening LE edema about 3 days ago after she was discharged from the hospital. She reports onset of SOB last night. She then had onset of dizziness and HA today. She took Percocet and had no relief. She also reports upper abdominal pain bilateral that radiates to her back. Review of chart indicates pregnancy and postpartum course was uncomplicated.  Hospital Course: Discharge Diagnoses:  Active Problems:   Preeclampsia in postpartum period   Acute pulmonary edema (HCC)   Acute respiratory failure with hypoxia (HCC)   Dyspnea   SOB (shortness of breath)  Acute Hypoxic Respiratory Failure - patient admitted to [facility] hospital with hypoxic respiratory fialure due to pulmonary edema in the setting of pre-eclampsia on underlying ILD from scleroderma. She eventually was transferred to [facility] for critical care assistance. She is s/p C section on 11/29. She was aggressively diuresed and her hypoxia resolved and was weaned off to room air. She is net negative 20L and her weight has improved from 260 lbs on admission  to 232 lbs on discharge. She was able to ambulate in the hallway on room air maintaining her sats into the 90s without significant dyspnea. CTA on admission negative for PE. ECHO; normal Ef and with diastolic dysfunction. Sputum culture; growing few streptococcus, patient remain afebrile, WBC normal, discussed with CCM would not start antibiotics at this time.  Scleroderma - Continue with Plaquenil, she is followed by Dr. [name]  HTN - she is no longer hypertensive and off of medications    04/18/2016  f/u ov/Laura Rowland re: transiition oc care p admit/  Chronic cough on nexium bid ac in pt with scleroderma Chief Complaint  Patient presents with  .  Follow-up    Follow up per [name] for scleroderma, Pt here today c/o increase sob, finding it hard to catch her breath, has a non productive coug, slight wheezing at times, Denies fever  Stayed in bed x 8 weeks p [facility] and now 85% better    Using saba three times a week and no neb need at all  Cough/wheeze worse p exertion / not noct   Has not tried inhaler for cough  Doe = MMRC2 = can't walk a nl pace on a flat grade s sob but does fine slow and flat eg shopping  rec Plan A = Automatic = symbicort 80 Take 2 puffs first thing in am and then another 2 puffs about 12 hours later.  Work on inhaler technique:    Plan B = Backup Only use your albuterol as a rescue medication  Plan C = Crisis - only use your albuterol nebulizer if you first try Plan B and it fails to help > ok to use the nebulizer up to every 4 hours but if start needing it regularly call for immediate appointment  Please schedule a follow up visit in 3 months but call sooner if needed with PFT on return       07/20/2016  f/u ov/Laura Rowland re: worsening asthma off symbicort  Chief Complaint  Patient presents with  . Follow-up    PFT's today. Pt states having increased SOB, wheezing and cough- started approx 1 wk ago while at [employer]. She states she has been using her albuterol inhaler 4-5 x per day and neb 2 x daily since having these symptoms.   after symbicort completed  around 1st May  Stopped it thru June 20th and did  [place] =  95% better - then drove to [place] and on first day more doe in heat walking all over [place]  then noct wheeze much better p albuterol  And since then much more saba need assoc with mostly dry coughing fits 07/17/16 eval [name], d dimer  0.6 > restarted pred and 75% improved since then  rec Plan A = Automatic = symbicort 80 (dulera 100 samples) Take 2 puffs first thing in am and then another 2 puffs about 12 hours later.  Work on inhaler technique:     Plan B = Backup Only  use your albuterol as a rescue medication  Plan C = Crisis - only use your albuterol nebulizer if you first try Plan B and it fails to help > ok to use the nebulizer up to every 4 hours but if start needing it regularly call for immediate appointment    05/09/2017 acute extended ov/Laura Rowland re: scleroderma with worse sob /dry cough  maint on symbicort 80 2bid  Chief Complaint  Patient presents with  . Acute Visit  Cough and SOB x 1 wk. Cough has not been prod. She states she feels SOB with or without any exertion.   baseline  gso parking lot >  [place] terminal  Much worse since  05/02/17 and rescue helps/ neb helps more assoc with throat fullness and urge to clear throat but cough is dry and day > noct  On nexium bid ac maint Some better with saba  rec Prednisone 10 mg take  4 each am x 2 days,   2 each am x 2 days,  1 each am x 2 days and stop  If better and stay better > keep appt for August 2019 pfts for scleoderma  If not better, call to do pfts sooner  If better and then get worse, call for double strength symbicort  GERD diet     10/02/2017  f/u ov/Laura Rowland re: scleroderma/ prob cough variant asthma vs uacs Chief Complaint  Patient presents with  . Follow-up    WANTS FLU SHOT TODAY;Review PFT: Pt state she is doing pretty good-past few days has had to use her rescue inhaler-one of those happened during the night.   Dyspnea:  MMRC2 = can't walk a nl pace on a flat grade s sob but does fine slow and flat  Cough: no Sleeping: on back on 3 pillows  SABA use: a couple times last week  02: no   rec Good work on your inhaler technique and no change on your medications Work on inhaler technique:   Nexium 40 mg Take 30- 60 min before your first and last meals of the day     10/03/2018  f/u ov/Laura Rowland re: scleroderma on plaquenil per rheum in [place] / cough variant asthma - no recent flares of arthritis/sob  Chief Complaint  Patient presents with  . Mild persistent asthma in adult without  complication    Medications for asthma have been working well since last visit.  Dyspnea:  Better since increased symb to 160 2bid / able to work out but uses saba first  Cough: no Sleeping: on back one pillow SABA use: none 02: none  rec Only use your albuterol as a rescue medication  Work on inhaler technique:   Yearly f/u    10/05/2019  f/u ov/Laura Rowland re:   scleroderma  With new cp x sev weeks / had 3rd covid moderna  Aug 24th 2021 Chief Complaint  Patient presents with  . Follow-up    reports intermittent chest pains at night when lying down. denies increase shortness of breath   Dyspnea:  Still working out and not needing any albuterol  Cough: sometimes cough with work out  Sleeping: bed is flat with two pillow  SABA use: none  02: none  Midline ant cp x sev weeks x minutes to maybe an hour  not radiating and then resolves on its own no diaphor, n or v and never with kick boxing  rec Nexium  Take 30- 60 min before your first and last meals of the day  GERD diet  Make sure you check your oxygen saturations at highest level of activity to be sure it stays over 90% and keep track of it at least once a week, more often if breathing getting worse, and let me know if losing ground.  Please schedule a follow up office visit in 6 weeks, call sooner if needed with pfts    11/04/19   11/18/2019  f/u ov/Laura Rowland re: cp was same location anteriorly persisted and worse  lying down / better "on it's own" and has not recurred - ? If occurred before or after booster  Chief Complaint  Patient presents with  . Follow-up    PFT's done today. Breathing is unchanged since her last visit. She went to ED 11/04/2019 after having "crushing pain in my chest" for approx 1 wk- pain is now resolved.    Dyspnea:  Not limited by breathing from desired activities  / back to kick boxing  Cough: worse with aerobic activity but may be improving  Sleeping: flat bed two pillows  SABA use: symb 160 no need for  rescue 02: none    No obvious day to day or daytime variability or assoc excess/ purulent sputum or mucus plugs or hemoptysis or cp or chest tightness, subjective wheeze or overt sinus or hb symptoms.   Sleeping fine now  without nocturnal  or early am exacerbation  of respiratory  c/o's or need for noct saba. Also denies any obvious fluctuation of symptoms with weather or environmental changes or other aggravating or alleviating factors except as outlined above   No unusual exposure hx or h/o childhood pna/ asthma or knowledge of premature birth.  Current Allergies, Complete Past Medical History, Past Surgical History, Family History, and Social History were reviewed in [employer] record.  ROS  The following are not active complaints unless bolded Hoarseness, sore throat, dysphagia, dental problems, itching, sneezing,  nasal congestion or discharge of excess mucus or purulent secretions, ear ache,   fever, chills, sweats, unintended wt loss or wt gain, classically pleuritic or exertional cp,  orthopnea pnd or arm/hand swelling  or leg swelling, presyncope, palpitations, abdominal pain, anorexia, nausea, vomiting, diarrhea  or change in bowel habits or change in bladder habits, change in stools or change in urine, dysuria, hematuria,  rash, arthralgias, visual complaints, headache, numbness, weakness or ataxia or problems with walking or coordination,  change in mood or  memory.        Current Meds  Medication Sig  . amLODipine (NORVASC) 10 MG tablet Take 10 mg by mouth daily.  . betamethasone dipropionate (DIPROLENE) 0.05 % ointment   . budesonide-formoterol (SYMBICORT) 160-4.5 MCG/ACT inhaler Inhale 2 puffs into the lungs 2 (two) times daily.  . Cholecalciferol (VITAMIN D) 2000 UNITS tablet Take 2,000 Units by mouth daily.  . diclofenac Sodium (VOLTAREN) 1 % GEL Voltaren 1 % topical gel  . esomeprazole (NEXIUM) 40 MG capsule Take 30- 60 min before your first and last  meals of the day (Patient taking differently: Take 40 mg by mouth 2 (two) times daily before a meal. Take 30- 60 min before your first and last meals of the day)  . HYDROcodone-acetaminophen (NORCO/VICODIN) 5-325 MG tablet TAKE 1   2 TABLET BY ORAL ROUTE EVERY 8 HOURS AS NEEDED FOR PAIN AS NEEDED(28 DAY SUPPLY)  . hydroxychloroquine (PLAQUENIL) 200 MG tablet Take 200 mg by mouth 3 (three) times daily. Pt takes 2 tablets 400mg  in the morning and one tablet 200mg  at night  . ibuprofen (ADVIL,MOTRIN) 800 MG tablet Take 800 mg by mouth every 8 (eight) hours as needed.  Marland Kitchen ipratropium-albuterol (DUONEB) 0.5-2.5 (3) MG/3ML SOLN Take 3 mLs by nebulization 3 (three) times daily. (Patient taking differently: Take 3 mLs by nebulization 3 (three) times daily as needed. )  . mycophenolate (CELLCEPT) 250 MG capsule Take 250 mg by mouth 2 (two) times daily.  . naproxen (NAPROSYN) 500 MG tablet Take 1 tablet (500 mg total) by mouth  2 (two) times daily.  Marland Kitchen NIFEdipine (ADALAT CC) 30 MG 24 hr tablet Take by mouth.  . pentoxifylline (TRENTAL) 400 MG CR tablet Take 400 mg by mouth 3 (three) times daily.  . prazosin (MINIPRESS) 1 MG capsule Take 1 mg by mouth 3 (three) times daily.  . Prenatal 27-1 MG TABS Take 1 tablet by mouth daily.  Marland Kitchen PROAIR HFA 108 (90 Base) MCG/ACT inhaler Inhale 2 puffs into the lungs every 4 (four) hours as needed for wheezing or shortness of breath.  . valACYclovir (VALTREX) 500 MG tablet Take 500 mg by mouth daily.                              Objective:   Physical Exam  11/18/2019        10/05/2019        248  10/03/2018        245  10/02/2017        246 05/09/2017        250   04/18/2016      236   06/22/15 268 lb 6.4 oz (121.745 kg)  06/24/14 250 lb (113.399 kg)  11/03/12 243 lb (110.224 kg)    Vital signs reviewed  10/05/2019  - Note at rest 02 sats  99% on RA        amb bf nad   HEENT : pt wearing mask not removed for exam due to covid -19 concerns.    NECK :   without JVD/Nodes/TM/ nl carotid upstrokes bilaterally   LUNGS: no acc muscle use,  Nl contour chest which is clear to A and P bilaterally without cough on insp or exp maneuvers   CV:  RRR  no s3 or murmur or increase in P2, and no edema   ABD:  soft and nontender with nl inspiratory excursion in the supine position. No bruits or organomegaly appreciated, bowel sounds nl  MS:  Nl gait/ ext warm- cannot straighten fingers , calf tenderness, cyanosis or clubbing No obvious joint restrictions   SKIN: warm and dry without lesions    NEURO:  alert, approp, nl sensorium with  no motor or cerebellar deficits apparent.      I personally reviewed images and agree with radiology impression as follows:   Chest CTa  11/04/19  1. No acute intrathoracic pathology. No CT evidence of pulmonary artery embolus. 2. Findings of interstitial lung disease similar or slightly progressed compared to the prior study. 3. Cholelithiasis. 4. Aortic Atherosclerosis (ICD10-I70.0).       Assessment & Plan:

## 2019-11-18 NOTE — Assessment & Plan Note (Signed)
Initial symptom 2006, dx [facility] 2007 - f/u Dr Shanahan Ventana  - HRCT 07/08/13 1. Marked progression of interstitial lung disease. Given the strong craniocaudal gradient, marked progression compared to prior study from 08/03/2004, and presence of honeycombing in the lower lobes of the lungs bilaterally, this pattern is compatible with usual interstitial pneumonia (UIP), presumably a manifestation of the patient's underlying scleroderma. 2. Dilated pulmonic trunk (4 cm in diameter), suggestive of pulmonary arterial hypertension. 3. Dilated esophagus presumably secondary to scleroderma. - Echo 01/28/15 s PH - 06/22/2015  Walked RA x 3 laps @ 185 ft each stopped due to end of study, nl pace, no significant desat or sob.  - symptoms improved on gerd rx 07/21/2015 despite additional month of gestation - CT w/o contrast  Chest  02/10/16  1. Near complete resolution of the extensive patchy areas of ground-glass attenuation seen in both lungs on the 12/30/2015 chest CT, most consistent with nearly resolved pulmonary edema. 2. Trace residual left pleural effusion is decreased. 3. Stable top-normal heart size. Stable chronically dilated main pulmonary artery, likely indicating chronic pulmonary arterial hypertension. 4. Basilar predominant fibrotic interstitial lung disease with honeycombing, mildly progressed since 07/08/2013 high-resolution chest CT study, compatible with usual interstitial pneumonia (UIP) due to scleroderma. - Echo 02/09/16 no PAH  - PFT's  02/09/16  FVC  2.54 (62%) with dloc 35% corrects to 62 % for alv vol  - PFT's  09/03/16  FVC  2.76 (68%)  with DLCO  37 % corrects to 61 % for alv volume    - PFT's  10/02/2017  FVC 2.95    with DLCO  41 % corrects to 60 % for alv volume   - CTa  11/04/19 minimal change ILD  -  10/05/2019   Walked RA  2 laps @ approx 250ft each @ fast pace  stopped due to end of study no sob and sast 99% at end - PFT's  11/18/2019  FVC 2.88  (71 % )  with DLCO  16.64  (59%) corrects to 3.77 (89%)  for alv volume     Still followed by  Rheum  - most important rec is to stay active and monitor sats at highest level of ex and call if losing ground with sats or ex tol.   Echo ordered by rheum in setting of recent midline cp sure sounds like it could have been mild pericaridits ? From moderna 3rd shot ? But has completely resolved now and CTa showed no fluid but esr not done.  Moot point unless recurs.         Each maintenance medication was reviewed in detail including emphasizing most importantly the difference between maintenance and prns and under what circumstances the prns are to be triggered using an action plan format where appropriate.  Total time for H and P, chart review, counseling, teaching device and generating customized AVS unique to this office visit / charting = 30 min       

## 2020-02-04 DIAGNOSIS — Z20822 Contact with and (suspected) exposure to covid-19: Secondary | ICD-10-CM | POA: Diagnosis not present

## 2020-03-03 DIAGNOSIS — B349 Viral infection, unspecified: Secondary | ICD-10-CM | POA: Diagnosis not present

## 2020-03-03 DIAGNOSIS — I5089 Other heart failure: Secondary | ICD-10-CM | POA: Diagnosis not present

## 2020-03-03 DIAGNOSIS — R29898 Other symptoms and signs involving the musculoskeletal system: Secondary | ICD-10-CM | POA: Diagnosis not present

## 2020-03-03 DIAGNOSIS — Z79899 Other long term (current) drug therapy: Secondary | ICD-10-CM | POA: Diagnosis not present

## 2020-03-03 DIAGNOSIS — E049 Nontoxic goiter, unspecified: Secondary | ICD-10-CM | POA: Insufficient documentation

## 2020-03-03 DIAGNOSIS — J8489 Other specified interstitial pulmonary diseases: Secondary | ICD-10-CM | POA: Diagnosis not present

## 2020-03-03 DIAGNOSIS — I2721 Secondary pulmonary arterial hypertension: Secondary | ICD-10-CM | POA: Diagnosis not present

## 2020-03-03 DIAGNOSIS — M3489 Other systemic sclerosis: Secondary | ICD-10-CM | POA: Diagnosis not present

## 2020-03-08 ENCOUNTER — Other Ambulatory Visit: Payer: Self-pay | Admitting: Internal Medicine

## 2020-03-08 DIAGNOSIS — E049 Nontoxic goiter, unspecified: Secondary | ICD-10-CM

## 2020-03-08 DIAGNOSIS — R591 Generalized enlarged lymph nodes: Secondary | ICD-10-CM

## 2020-03-09 ENCOUNTER — Ambulatory Visit
Admission: RE | Admit: 2020-03-09 | Discharge: 2020-03-09 | Disposition: A | Discharge status: Home / Self Care | Payer: Medicare Other | Source: Ambulatory Visit | Attending: Internal Medicine | Admitting: Internal Medicine

## 2020-03-09 ENCOUNTER — Other Ambulatory Visit: Payer: Self-pay | Admitting: Internal Medicine

## 2020-03-09 DIAGNOSIS — R591 Generalized enlarged lymph nodes: Secondary | ICD-10-CM

## 2020-03-09 DIAGNOSIS — E041 Nontoxic single thyroid nodule: Secondary | ICD-10-CM | POA: Diagnosis not present

## 2020-03-09 DIAGNOSIS — E049 Nontoxic goiter, unspecified: Secondary | ICD-10-CM

## 2020-03-28 DIAGNOSIS — M79641 Pain in right hand: Secondary | ICD-10-CM | POA: Insufficient documentation

## 2020-03-28 DIAGNOSIS — M79642 Pain in left hand: Secondary | ICD-10-CM | POA: Diagnosis not present

## 2020-03-28 DIAGNOSIS — M349 Systemic sclerosis, unspecified: Secondary | ICD-10-CM | POA: Diagnosis not present

## 2020-04-04 DIAGNOSIS — M349 Systemic sclerosis, unspecified: Secondary | ICD-10-CM | POA: Diagnosis not present

## 2020-04-04 DIAGNOSIS — L219 Seborrheic dermatitis, unspecified: Secondary | ICD-10-CM | POA: Diagnosis not present

## 2020-04-04 DIAGNOSIS — L669 Cicatricial alopecia, unspecified: Secondary | ICD-10-CM | POA: Diagnosis not present

## 2020-04-04 DIAGNOSIS — L089 Local infection of the skin and subcutaneous tissue, unspecified: Secondary | ICD-10-CM | POA: Diagnosis not present

## 2020-04-04 DIAGNOSIS — L942 Calcinosis cutis: Secondary | ICD-10-CM | POA: Diagnosis not present

## 2020-04-04 DIAGNOSIS — I7300 Raynaud's syndrome without gangrene: Secondary | ICD-10-CM | POA: Diagnosis not present

## 2020-04-14 ENCOUNTER — Ambulatory Visit: Payer: Medicare Other | Admitting: Podiatry

## 2020-04-14 ENCOUNTER — Other Ambulatory Visit: Payer: Self-pay

## 2020-04-14 ENCOUNTER — Ambulatory Visit (INDEPENDENT_AMBULATORY_CARE_PROVIDER_SITE_OTHER): Payer: Medicare Other

## 2020-04-14 ENCOUNTER — Encounter: Payer: Self-pay | Admitting: Podiatry

## 2020-04-14 DIAGNOSIS — R7303 Prediabetes: Secondary | ICD-10-CM | POA: Insufficient documentation

## 2020-04-14 DIAGNOSIS — M2041 Other hammer toe(s) (acquired), right foot: Secondary | ICD-10-CM

## 2020-04-14 DIAGNOSIS — A6004 Herpesviral vulvovaginitis: Secondary | ICD-10-CM | POA: Insufficient documentation

## 2020-04-14 DIAGNOSIS — E559 Vitamin D deficiency, unspecified: Secondary | ICD-10-CM | POA: Insufficient documentation

## 2020-04-14 DIAGNOSIS — M2042 Other hammer toe(s) (acquired), left foot: Secondary | ICD-10-CM

## 2020-04-14 DIAGNOSIS — K219 Gastro-esophageal reflux disease without esophagitis: Secondary | ICD-10-CM | POA: Insufficient documentation

## 2020-04-14 DIAGNOSIS — N926 Irregular menstruation, unspecified: Secondary | ICD-10-CM | POA: Insufficient documentation

## 2020-04-14 DIAGNOSIS — G43909 Migraine, unspecified, not intractable, without status migrainosus: Secondary | ICD-10-CM | POA: Insufficient documentation

## 2020-04-14 DIAGNOSIS — R631 Polydipsia: Secondary | ICD-10-CM | POA: Insufficient documentation

## 2020-04-14 DIAGNOSIS — D509 Iron deficiency anemia, unspecified: Secondary | ICD-10-CM | POA: Insufficient documentation

## 2020-04-14 NOTE — Progress Notes (Signed)
Subjective:   Patient ID: Laura Rowland, female   DOB: 45 y.o.   MRN: [id number]   HPI Patient states her lesser toes have really been bothering her left over right foot and states they have become more contracted over the last year.  States she healed very nicely from the previous hammertoe surgery and would like to get these corrected if possible neuro   ROS      Objective:  Physical Exam  Vascular status intact good digital perfusion noted good skin texture with patient found to have rigid contracture digits 234 left over right with distal keratotic lesions and proximal lesions on the head of the proximal phalanx of both feet.  Patient was found to have no other pathology     Assessment:  Severe hammertoe deformity digits 234 bilateral     Plan:  H&P reviewed condition discussed correction of deformity.  She wants this done and I will do 1 foot at a time and I did explain risk associated with previous scleroderma and that it may affect healing but she healed beautifully from her fifth digits last year so should do okay with this.  She understands risk and read consent form going over alternative treatments complications and at this time is scheduled for outpatient digital fusion digits 234 left foot with every question answered.  This will be done on outpatient basis  X-rays indicate that there is significant rigid contracture digits 2 through 4 bilateral with rigid contracture at the MPJs proximal joints

## 2020-04-20 ENCOUNTER — Telehealth: Payer: Self-pay | Admitting: Urology

## 2020-04-20 NOTE — Telephone Encounter (Signed)
DOS - 05/03/20  HAMMERTOE REPAIR 2-4 LEFT --- 85929  [employer] EFFECTIVE DATE - 01/23/20   PLAN DEDUCTIBLE - $0.00 W/ $0.00 REMAINING OUT OF POCKET -  $3,600.00 W/ $3,385.57 REMAINING COPAY - $295.00 COINSURANCE - 0%   PER [employer] WEBSITE FOR CPT CODE 24462 X'S 3 Notification or Prior Authorization is not required for the requested services  Decision ID #:[id number]

## 2020-04-25 ENCOUNTER — Other Ambulatory Visit (HOSPITAL_COMMUNITY): Payer: Self-pay | Admitting: Internal Medicine

## 2020-04-25 DIAGNOSIS — I270 Primary pulmonary hypertension: Secondary | ICD-10-CM

## 2020-04-26 ENCOUNTER — Other Ambulatory Visit (HOSPITAL_COMMUNITY): Payer: Self-pay | Admitting: Radiology

## 2020-04-26 DIAGNOSIS — J8489 Other specified interstitial pulmonary diseases: Secondary | ICD-10-CM

## 2020-05-02 MED ORDER — HYDROCODONE-ACETAMINOPHEN 10-325 MG PO TABS: 1.0000 | ORAL_TABLET | Freq: Three times a day (TID) | ORAL | 0 refills | Status: AC | PRN

## 2020-05-02 NOTE — Addendum Note (Signed)
Addended by: Wallene Huh on: 05/02/2020 05:18 PM   Modules accepted: Orders

## 2020-05-03 ENCOUNTER — Encounter: Payer: Self-pay | Admitting: Podiatry

## 2020-05-03 DIAGNOSIS — M2042 Other hammer toe(s) (acquired), left foot: Secondary | ICD-10-CM | POA: Diagnosis not present

## 2020-05-04 ENCOUNTER — Telehealth: Payer: Self-pay | Admitting: *Deleted

## 2020-05-04 ENCOUNTER — Other Ambulatory Visit: Payer: Self-pay | Admitting: Podiatry

## 2020-05-04 ENCOUNTER — Telehealth: Payer: Self-pay | Admitting: Podiatry

## 2020-05-04 MED ORDER — HYDROMORPHONE HCL 4 MG PO TABS: 4.0000 mg | ORAL_TABLET | Freq: Four times a day (QID) | ORAL | 0 refills | Status: DC | PRN

## 2020-05-04 NOTE — Telephone Encounter (Signed)
Patient's pain medicine is wearing off before it's time to take another one,had surgery 1 day ago. She is in a lot of pain(currently taking 1 tablet every 8 hours-only 15 pills).Please advise.

## 2020-05-04 NOTE — Telephone Encounter (Signed)
I called in something stronger for a few days

## 2020-05-04 NOTE — Telephone Encounter (Signed)
Called and informed patient that the new pain medication had been sent to pharmacy on file.She verbalized understanding.

## 2020-05-04 NOTE — Telephone Encounter (Signed)
Patient called inquiring about pain management, stated the script was wrote for every 8 hours, patient stated the meds are wearing off before the 8 hours are up and afraid the meds wont last, Patient has also requested refill on pain meds, Please Advise

## 2020-05-04 NOTE — Telephone Encounter (Signed)
Patient requesting a call back. Although stronger medication was prescribed, patient wants to discuss her pain; feels as if it isn't normal to be in this much pain. Please contact patient asap.

## 2020-05-05 NOTE — Telephone Encounter (Signed)
I talked to her

## 2020-05-07 DIAGNOSIS — Z20822 Contact with and (suspected) exposure to covid-19: Secondary | ICD-10-CM | POA: Diagnosis not present

## 2020-05-09 ENCOUNTER — Encounter: Payer: Medicare Other | Admitting: Podiatry

## 2020-05-10 ENCOUNTER — Ambulatory Visit (HOSPITAL_COMMUNITY)
Admission: RE | Admit: 2020-05-10 | Discharge: 2020-05-10 | Disposition: A | Discharge status: Home / Self Care | Payer: Medicare Other | Source: Ambulatory Visit | Attending: Internal Medicine | Admitting: Internal Medicine

## 2020-05-10 ENCOUNTER — Other Ambulatory Visit: Payer: Self-pay

## 2020-05-10 DIAGNOSIS — J8489 Other specified interstitial pulmonary diseases: Secondary | ICD-10-CM | POA: Diagnosis not present

## 2020-05-10 LAB — PULMONARY FUNCTION TEST
DL/VA % pred: 82 %
DL/VA: 3.42 ml/min/mmHg/L
DLCO unc % pred: 53 %
DLCO unc: 14.87 ml/min/mmHg
FEF 25-75 Pre: 1.21 L/sec
FEF2575-%Pred-Pre: 36 %
FEV1-%Pred-Pre: 59 %
FEV1-Pre: 1.95 L
FEV1FVC-%Pred-Pre: 81 %
FEV6-%Pred-Pre: 71 %
FEV6-Pre: 2.82 L
FEV6FVC-%Pred-Pre: 100 %
FVC-%Pred-Pre: 71 %
FVC-Pre: 2.86 L
Pre FEV1/FVC ratio: 68 %
Pre FEV6/FVC Ratio: 99 %

## 2020-05-12 ENCOUNTER — Ambulatory Visit (INDEPENDENT_AMBULATORY_CARE_PROVIDER_SITE_OTHER): Payer: Medicare Other

## 2020-05-12 ENCOUNTER — Other Ambulatory Visit: Payer: Self-pay

## 2020-05-12 ENCOUNTER — Ambulatory Visit: Payer: Medicare Other

## 2020-05-12 ENCOUNTER — Encounter: Payer: Self-pay | Admitting: Podiatry

## 2020-05-12 ENCOUNTER — Ambulatory Visit (INDEPENDENT_AMBULATORY_CARE_PROVIDER_SITE_OTHER): Payer: Medicare Other | Admitting: Podiatry

## 2020-05-12 DIAGNOSIS — M2042 Other hammer toe(s) (acquired), left foot: Secondary | ICD-10-CM

## 2020-05-12 DIAGNOSIS — M2041 Other hammer toe(s) (acquired), right foot: Secondary | ICD-10-CM

## 2020-05-12 NOTE — Progress Notes (Signed)
Subjective:   Patient ID: Laura Rowland, female   DOB: 45 y.o.   MRN: [id number]   HPI Patient states doing great with left foot states the toes are doing well with minimal discomfort   ROS      Objective:  Physical Exam  Neurovascular status intact negative [name]' sign noted wound edges well coapted pins intact digits 234 left with stitches intact     Assessment:  Doing well post arthroplasty digits 234 left foot     Plan:  X-rays reviewed sterile dressings reapplied continue immobilization elevation compression reappoint 2 weeks or earlier if needed  X-rays indicate that the digits are in excellent alignment pins in perfect alignment with good anatomy noted

## 2020-05-18 DIAGNOSIS — M349 Systemic sclerosis, unspecified: Secondary | ICD-10-CM | POA: Diagnosis not present

## 2020-05-18 DIAGNOSIS — M25631 Stiffness of right wrist, not elsewhere classified: Secondary | ICD-10-CM | POA: Diagnosis not present

## 2020-05-18 DIAGNOSIS — M25632 Stiffness of left wrist, not elsewhere classified: Secondary | ICD-10-CM | POA: Diagnosis not present

## 2020-05-26 ENCOUNTER — Ambulatory Visit (INDEPENDENT_AMBULATORY_CARE_PROVIDER_SITE_OTHER): Payer: Medicare Other | Admitting: Podiatry

## 2020-05-26 ENCOUNTER — Encounter: Payer: Self-pay | Admitting: Podiatry

## 2020-05-26 ENCOUNTER — Ambulatory Visit (INDEPENDENT_AMBULATORY_CARE_PROVIDER_SITE_OTHER): Payer: Medicare Other

## 2020-05-26 ENCOUNTER — Other Ambulatory Visit: Payer: Self-pay

## 2020-05-26 DIAGNOSIS — M2042 Other hammer toe(s) (acquired), left foot: Secondary | ICD-10-CM | POA: Diagnosis not present

## 2020-05-26 DIAGNOSIS — M25632 Stiffness of left wrist, not elsewhere classified: Secondary | ICD-10-CM | POA: Diagnosis not present

## 2020-05-26 DIAGNOSIS — M25631 Stiffness of right wrist, not elsewhere classified: Secondary | ICD-10-CM | POA: Diagnosis not present

## 2020-05-26 DIAGNOSIS — R29898 Other symptoms and signs involving the musculoskeletal system: Secondary | ICD-10-CM | POA: Diagnosis not present

## 2020-05-26 DIAGNOSIS — M349 Systemic sclerosis, unspecified: Secondary | ICD-10-CM | POA: Diagnosis not present

## 2020-05-27 ENCOUNTER — Ambulatory Visit (HOSPITAL_COMMUNITY)
Admission: RE | Admit: 2020-05-27 | Discharge: 2020-05-27 | Disposition: A | Discharge status: Home / Self Care | Payer: Medicare Other | Source: Ambulatory Visit | Attending: Internal Medicine | Admitting: Internal Medicine

## 2020-05-27 DIAGNOSIS — I7300 Raynaud's syndrome without gangrene: Secondary | ICD-10-CM | POA: Insufficient documentation

## 2020-05-27 DIAGNOSIS — I270 Primary pulmonary hypertension: Secondary | ICD-10-CM | POA: Diagnosis not present

## 2020-05-27 DIAGNOSIS — E119 Type 2 diabetes mellitus without complications: Secondary | ICD-10-CM | POA: Diagnosis not present

## 2020-05-27 DIAGNOSIS — M349 Systemic sclerosis, unspecified: Secondary | ICD-10-CM | POA: Insufficient documentation

## 2020-05-27 DIAGNOSIS — I081 Rheumatic disorders of both mitral and tricuspid valves: Secondary | ICD-10-CM | POA: Insufficient documentation

## 2020-05-27 LAB — ECHOCARDIOGRAM COMPLETE
Area-P 1/2: 3.21 cm2
S' Lateral: 3.4 cm

## 2020-05-27 NOTE — Progress Notes (Signed)
  Echocardiogram 2D Echocardiogram has been performed.  Laura Rowland 05/27/2020, 12:01 PM

## 2020-05-29 NOTE — Progress Notes (Signed)
Subjective:   Patient ID: Laura Rowland, female   DOB: 45 y.o.   MRN: [id number]   HPI Patient presents for suture removal of digits 234 left stating that she is still getting some swelling but overall doing well and the pins are intact   ROS      Objective:  Physical Exam  Neurovascular status intact negative [name]' sign noted pins intact digits 2 through 4 left good alignment with stitches intact     Assessment:  Doing well post fusions digits 234 with fixation in place     Plan:  H&P x-ray reviewed stitches removed wound edges coapted well instructed on continued elevation compression reappoint 2 weeks pin removal earlier if needed  X-rays indicate fixation is in place good alignment noted

## 2020-06-03 DIAGNOSIS — M25632 Stiffness of left wrist, not elsewhere classified: Secondary | ICD-10-CM | POA: Diagnosis not present

## 2020-06-03 DIAGNOSIS — M25631 Stiffness of right wrist, not elsewhere classified: Secondary | ICD-10-CM | POA: Diagnosis not present

## 2020-06-03 DIAGNOSIS — M349 Systemic sclerosis, unspecified: Secondary | ICD-10-CM | POA: Diagnosis not present

## 2020-06-03 DIAGNOSIS — R29898 Other symptoms and signs involving the musculoskeletal system: Secondary | ICD-10-CM | POA: Diagnosis not present

## 2020-06-09 ENCOUNTER — Ambulatory Visit (INDEPENDENT_AMBULATORY_CARE_PROVIDER_SITE_OTHER): Payer: Medicare Other | Admitting: Podiatry

## 2020-06-09 ENCOUNTER — Ambulatory Visit (INDEPENDENT_AMBULATORY_CARE_PROVIDER_SITE_OTHER): Payer: Medicare Other

## 2020-06-09 ENCOUNTER — Encounter: Payer: Self-pay | Admitting: Podiatry

## 2020-06-09 ENCOUNTER — Other Ambulatory Visit: Payer: Self-pay

## 2020-06-09 DIAGNOSIS — M25549 Pain in joints of unspecified hand: Secondary | ICD-10-CM | POA: Diagnosis not present

## 2020-06-09 DIAGNOSIS — M25632 Stiffness of left wrist, not elsewhere classified: Secondary | ICD-10-CM | POA: Diagnosis not present

## 2020-06-09 DIAGNOSIS — M349 Systemic sclerosis, unspecified: Secondary | ICD-10-CM | POA: Diagnosis not present

## 2020-06-09 DIAGNOSIS — M2042 Other hammer toe(s) (acquired), left foot: Secondary | ICD-10-CM | POA: Diagnosis not present

## 2020-06-09 DIAGNOSIS — M25631 Stiffness of right wrist, not elsewhere classified: Secondary | ICD-10-CM | POA: Diagnosis not present

## 2020-06-09 NOTE — Progress Notes (Signed)
Subjective:   Patient ID: Laura Rowland, female   DOB: 45 y.o.   MRN: [id number]   HPI Patient states doing really well with left foot very pleased with some crusted tissue left fourth digit with pins in place   ROS      Objective:  Physical Exam  Neurovascular status intact negative [name]' sign noted digits 2 through 4 straight with alignment good pins in place and crusted tissue left fourth toe no sinus suture no drainage     Assessment:  We well post fusion digits 234 left we will need the right 1 done in the fall     Plan:  H&P pins removed sterile dressing applied x-rays reviewed advised on gradual return to soft shoe wide shoe gear and patient will be seen back prior to getting her other foot done which she needs to wait through the summer 4.  She will continue boot usage with gradual increase in tennis shoes sandals over the next few weeks  X-rays indicate good healing good positional component digits 234 left

## 2020-06-13 DIAGNOSIS — H40013 Open angle with borderline findings, low risk, bilateral: Secondary | ICD-10-CM | POA: Diagnosis not present

## 2020-06-13 DIAGNOSIS — M340 Progressive systemic sclerosis: Secondary | ICD-10-CM | POA: Diagnosis not present

## 2020-06-13 DIAGNOSIS — H5213 Myopia, bilateral: Secondary | ICD-10-CM | POA: Diagnosis not present

## 2020-06-13 DIAGNOSIS — Z79899 Other long term (current) drug therapy: Secondary | ICD-10-CM | POA: Diagnosis not present

## 2020-06-13 DIAGNOSIS — H04123 Dry eye syndrome of bilateral lacrimal glands: Secondary | ICD-10-CM | POA: Diagnosis not present

## 2020-07-14 DIAGNOSIS — J40 Bronchitis, not specified as acute or chronic: Secondary | ICD-10-CM | POA: Diagnosis not present

## 2020-07-15 DIAGNOSIS — Z20822 Contact with and (suspected) exposure to covid-19: Secondary | ICD-10-CM | POA: Diagnosis not present

## 2020-08-05 DIAGNOSIS — Z23 Encounter for immunization: Secondary | ICD-10-CM | POA: Diagnosis not present

## 2020-08-08 DIAGNOSIS — L669 Cicatricial alopecia, unspecified: Secondary | ICD-10-CM | POA: Diagnosis not present

## 2020-08-08 DIAGNOSIS — M349 Systemic sclerosis, unspecified: Secondary | ICD-10-CM | POA: Diagnosis not present

## 2020-08-08 DIAGNOSIS — L219 Seborrheic dermatitis, unspecified: Secondary | ICD-10-CM | POA: Diagnosis not present

## 2020-08-12 ENCOUNTER — Telehealth: Payer: Self-pay | Admitting: Internal Medicine

## 2020-08-12 MED ORDER — PREDNISONE 10 MG PO TABS: ORAL_TABLET | ORAL | 0 refills | Status: DC

## 2020-08-12 NOTE — Telephone Encounter (Signed)
Called and spoke with patient. She verbalized understanding. Will go ahead and place order for the prednisone.   Nothing further needed at time of call.

## 2020-08-12 NOTE — Telephone Encounter (Signed)
Spoke with the pt  She is c/o increased SOB, cough- non prod, and wheezing over the past month  Started around 07/10/20, had virtual visit with PCP- given pred and zpack- some better then 6/25 dx with covid  She is fully vaxxed including 2 boosters She states her symptoms are not quite as bad, but still wheezing in the am and cough with exertion, SOB more than baseline, using rescue about every day and still on symbicort bid I scheduled her in gso with MW on 08/16/20  Dr Melvyn Novas, did you want to give her anything sooner to get her through until then  She said pred has helped before, thanks!

## 2020-08-12 NOTE — Telephone Encounter (Signed)
Prednisone 10 mg take  4 each am x 2 days,   2 each am x 2 days,  1 each am x 2 days and stop  

## 2020-08-16 ENCOUNTER — Ambulatory Visit (INDEPENDENT_AMBULATORY_CARE_PROVIDER_SITE_OTHER): Payer: Medicare Other

## 2020-08-16 ENCOUNTER — Other Ambulatory Visit: Payer: Self-pay

## 2020-08-16 ENCOUNTER — Ambulatory Visit: Payer: Medicare Other | Admitting: Internal Medicine

## 2020-08-16 ENCOUNTER — Encounter: Payer: Self-pay | Admitting: Internal Medicine

## 2020-08-16 DIAGNOSIS — R059 Cough, unspecified: Secondary | ICD-10-CM | POA: Diagnosis not present

## 2020-08-16 DIAGNOSIS — J4530 Mild persistent asthma, uncomplicated: Secondary | ICD-10-CM

## 2020-08-16 DIAGNOSIS — M349 Systemic sclerosis, unspecified: Secondary | ICD-10-CM | POA: Diagnosis not present

## 2020-08-16 DIAGNOSIS — R918 Other nonspecific abnormal finding of lung field: Secondary | ICD-10-CM | POA: Diagnosis not present

## 2020-08-16 MED ORDER — PREDNISONE 10 MG PO TABS: ORAL_TABLET | ORAL | 0 refills | Status: DC

## 2020-08-16 NOTE — Progress Notes (Signed)
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 Asthma     History of Present Illness  06/22/2015 [facility] office visit/ Laura Rowland  @ [redacted] weeks gestation on ppi bid but not ac and saba but no longer ICS  Chief Complaint  Patient presents with   Advice Only    Referred by [name]; Shortness of breath, thinks she may have asthma.   for long as she can remember when cold gets in chest gets severe symptoms of bad cough by codeine goes away eventually recurred in Dec 2016 > UC with cough/ sob / lost voice dx as asthma rx pred/proair and 100% better an no maint then March 2017 same thing  > UC eval rx one treatment and this resolved but started needing saba more but mostly just with exertion maybe once a day and never noct/ or early. Also loses breath talking.  saba helps  At baseline sob walking entire grocery store, never tried the saba first/ avg saba once daily Since onset IUP = slt more sob/ no increase in saba use  rec Pace yourself and walk slower to avoid becoming too short of breath as this is not healthy for oxygen delivery to the baby's oxygen level Only use your albuterol as a rescue medication Change nexium to Take 30- 60 min before your first and last meals of the day  GERD diet     07/21/2015  f/u ov/Laura Rowland re:   IUP 16 weeks / mild asthma/ no saba need since last ov on gerd rx  Chief Complaint  Patient presents with   Follow-up  Has learned to pace herself and no long sob rec No change in recommendations    Nov 29th 2017 c section then 3 days later swelling / hbp / sob > [facility]    Admit date:  12/28/2015 Discharge date: 01/07/2016    HPI: per [name], [name] (patient initially admitted to [facility] hospital then transferred to [facility] ICU) [id number], 1 week s/p repeat CS here with multiple sx. She reports worsening LE edema about 3 days ago after she was discharged from the hospital. She reports onset of SOB last night. She then had onset of dizziness and HA today. She took Percocet and had no relief. She also reports upper abdominal pain bilateral that radiates to her back. Review of chart indicates pregnancy and postpartum course was uncomplicated.   Hospital Course: Discharge Diagnoses:  Active Problems:   Preeclampsia in postpartum period   Acute pulmonary edema (HCC)   Acute respiratory failure with hypoxia (HCC)   Dyspnea   SOB (shortness of breath)   Acute Hypoxic Respiratory Failure - patient admitted to [facility] hospital with hypoxic respiratory fialure due to pulmonary edema in the setting of pre-eclampsia on underlying ILD from scleroderma. She eventually was transferred to [facility] for critical care assistance. She is s/p C section on 11/29. She was aggressively diuresed and her hypoxia resolved and was weaned off to room air. She is net negative 20L and her weight has improved from 260  lbs on admission to 232 lbs on discharge. She was able to ambulate in the hallway on room air maintaining her sats into the 90s without significant dyspnea. CTA on admission negative for PE. ECHO; normal Ef and with diastolic dysfunction. Sputum culture; growing few streptococcus, patient remain afebrile, WBC normal, discussed with CCM would not start antibiotics at this time.  Scleroderma - Continue with Plaquenil, she is followed by Dr. [name]   HTN - she is no longer hypertensive and off of medications    04/18/2016  f/u ov/Laura Rowland re: transiition oc care p admit/  Chronic cough on nexium bid ac in pt with scleroderma Chief Complaint  Patient presents with   Follow-up     Follow up per [facility] for scleroderma, Pt here today c/o increase sob, finding it hard to catch her breath, has a non productive coug, slight wheezing at times, Denies fever  Stayed in bed x 8 weeks p [facility] and now 85% better    Using saba three times a week and no neb need at all  Cough/wheeze worse p exertion / not noct   Has not tried inhaler for cough  Doe = MMRC2 = can't walk a nl pace on a flat grade s sob but does fine slow and flat eg shopping  rec Plan A = Automatic = symbicort 80 Take 2 puffs first thing in am and then another 2 puffs about 12 hours later.  Work on inhaler technique:    Plan B = Backup Only use your albuterol as a rescue medication  Plan C = Crisis - only use your albuterol nebulizer if you first try Plan B and it fails to help > ok to use the nebulizer up to every 4 hours but if start needing it regularly call for immediate appointment  Please schedule a follow up visit in 3 months but call sooner if needed with PFT on return       07/20/2016  f/u ov/Laura Rowland re: worsening asthma off symbicort  Chief Complaint  Patient presents with   Follow-up    PFT's today. Pt states having increased SOB, wheezing and cough- started approx 1 wk ago while at [employer]. She states she has been using her albuterol inhaler 4-5 x per day and neb 2 x daily since having these symptoms.   after symbicort completed  around 1st May  Stopped it thru June 20th and did  [place] =  95% better - then drove to [place] and on first day more doe in heat walking all over [place]  then noct wheeze much better p albuterol  And since then much more saba need assoc with mostly dry coughing fits 07/17/16 eval [name], d dimer  0.6 > restarted pred and 75% improved since then  rec Plan A = Automatic = symbicort 80 (dulera 100 samples) Take 2 puffs first thing in am and then another 2 puffs about 12 hours later.  Work on inhaler technique:     Plan B = Backup Only use your  albuterol as a rescue medication  Plan C = Crisis - only use your albuterol nebulizer if you first try Plan B and it fails to help > ok to use the nebulizer up to every 4 hours but if start needing it regularly call for immediate appointment    05/09/2017 acute extended ov/Laura Rowland re: scleroderma with worse sob /dry cough  maint on symbicort 80 2bid  Chief Complaint  Patient presents with  Acute Visit    Cough and SOB x 1 wk. Cough has not been prod. She states she feels SOB with or without any exertion.   baseline  [place] parking lot >  [place]  Much worse since  05/02/17 and rescue helps/ neb helps more assoc with throat fullness and urge to clear throat but cough is dry and day > noct  On nexium bid ac maint Some better with saba  rec Prednisone 10 mg take  4 each am x 2 days,   2 each am x 2 days,  1 each am x 2 days and stop  If better and stay better > keep appt for August 2019 pfts for scleoderma  If not better, call to do pfts sooner  If better and then get worse, call for double strength symbicort  GERD diet     10/02/2017  f/u ov/Laura Rowland re: scleroderma/ prob cough variant asthma vs uacs Chief Complaint  Patient presents with   Follow-up    WANTS FLU SHOT TODAY;Review PFT: Pt state she is doing pretty good-past few days has had to use her rescue inhaler-one of those happened during the night.   Dyspnea:  MMRC2 = can't walk a nl pace on a flat grade s sob but does fine slow and flat  Cough: no Sleeping: on back on 3 pillows  SABA use: a couple times last week  02: no   rec Good work on your inhaler technique and no change on your medications Work on inhaler technique:   Nexium 40 mg Take 30- 60 min before your first and last meals of the day     10/03/2018  f/u ov/Laura Rowland re: scleroderma on plaquenil per rheum in [place] / cough variant asthma - no recent flares of arthritis/sob  Chief Complaint  Patient presents with   Mild persistent asthma in adult without complication     Medications for asthma have been working well since last visit.  Dyspnea:  Better since increased symb to 160 2bid / able to work out but uses saba first  Cough: no Sleeping: on back one pillow SABA use: none 02: none  rec Only use your albuterol as a rescue medication  Work on inhaler technique:   Yearly f/u    10/05/2019  f/u ov/Laura Rowland re:   scleroderma  With new cp x sev weeks / had 3rd covid moderna  Aug 24th 2021 Chief Complaint  Patient presents with   Follow-up    reports intermittent chest pains at night when lying down. denies increase shortness of breath   Dyspnea:  Still working out and not needing any albuterol  Cough: sometimes cough with work out  Sleeping: bed is flat with two pillow  SABA use: none  02: none  Midline ant cp x sev weeks x minutes to maybe an hour  not radiating and then resolves on its own no diaphor, n or v and never with kick boxing  rec Nexium  Take 30- 60 min before your first and last meals of the day  GERD diet  Make sure you check your oxygen saturations at highest level of activity to be sure it stays over 90% and keep track of it at least once a week, more often if breathing getting worse, and let me know if losing ground.  Please schedule a follow up office visit in 6 weeks, call sooner if needed with pfts        11/18/2019  f/u ov/Laura Rowland re: cp was  same location anteriorly persisted and worse lying down / better "on it's own" and has not recurred - ? If occurred before or after booster  Chief Complaint  Patient presents with   Follow-up    PFT's done today. Breathing is unchanged since her last visit. She went to ED 11/04/2019 after having "crushing pain in my chest" for approx 1 wk- pain is now resolved.    Dyspnea:  Not limited by breathing from desired activities  / back to kick boxing  Cough: worse with aerobic activity but may be improving  Sleeping: flat bed two pillows  SABA use: symb 160 no need for rescue 02: none   Rec No  change symbicort  Make sure you check your oxygen saturations at highest level of activity to be sure it stays over 90% and keep track of it at least once a week, more often if breathing getting worse, and let me know if losing ground.  GERD  diet/ bed blocks  08/16/2020 Acute ov/Laura Rowland re: sob/ cough in pt with scleroderma/ asthma on symb 160  Chief Complaint  Patient presents with   Shortness of Breath    Reports dyspnea improved with recent prednisone  Doing fine s rescue then June 19th rx prednisone/ zpack and temporary cough relief then relief and maint on symbicort Dyspnea:  50 ft  Cough: dry / worse hs  and early in am  Sleeping: bed is flat / 2 pillows under head SABA use: last used rescuee 12 h prior to OV   02: none  Covid status:   vax x 4    No obvious day to day or daytime variability or assoc excess/ purulent sputum or mucus plugs or hemoptysis or cp or chest tightness, subjective wheeze or overt sinus or hb symptoms.     Also denies any obvious fluctuation of symptoms with weather or environmental changes or other aggravating or alleviating factors except as outlined above   No unusual exposure hx or h/o childhood pna/ asthma or knowledge of premature birth.  Current Allergies, Complete Past Medical History, Past Surgical History, Family History, and Social History were reviewed in [employer] record.  ROS  The following are not active complaints unless bolded Hoarseness, sore throat, dysphagia, dental problems, itching, sneezing,  nasal congestion or discharge of excess mucus or purulent secretions, ear ache,   fever, chills, sweats, unintended wt loss or wt gain, classically pleuritic or exertional cp,  orthopnea pnd or arm/hand swelling  or leg swelling, presyncope, palpitations, abdominal pain, anorexia, nausea, vomiting, diarrhea  or change in bowel habits or change in bladder habits, change in stools or change in urine, dysuria, hematuria,  rash,  arthralgias, visual complaints, headache, numbness, weakness or ataxia or problems with walking or coordination,  change in mood or  memory.        Current Meds  Medication Sig   amLODipine (NORVASC) 10 MG tablet Take 10 mg by mouth daily.   budesonide-formoterol (SYMBICORT) 160-4.5 MCG/ACT inhaler Inhale 2 puffs into the lungs 2 (two) times daily.   Cholecalciferol (VITAMIN D) 2000 UNITS tablet Take 2,000 Units by mouth daily.   diclofenac Sodium (VOLTAREN) 1 % GEL Voltaren 1 % topical gel   esomeprazole (NEXIUM) 40 MG capsule Take 30- 60 min before your first and last meals of the day (Patient taking differently: Take 40 mg by mouth 2 (two) times daily before a meal. Take 30- 60 min before your first and last meals of the day)  HYDROcodone-acetaminophen (NORCO/VICODIN) 5-325 MG tablet TAKE 1   2 TABLET BY ORAL ROUTE EVERY 8 HOURS AS NEEDED FOR PAIN AS NEEDED(28 DAY SUPPLY)   HYDROmorphone (DILAUDID) 4 MG tablet Take 1 tablet (4 mg total) by mouth every 6 (six) hours as needed for severe pain.   hydroxychloroquine (PLAQUENIL) 200 MG tablet Take 200 mg by mouth 3 (three) times daily. Pt takes 2 tablets '400mg'$  in the morning and one tablet '200mg'$  at night   ibuprofen (ADVIL,MOTRIN) 800 MG tablet Take 800 mg by mouth every 8 (eight) hours as needed.   ipratropium-albuterol (DUONEB) 0.5-2.5 (3) MG/3ML SOLN Take 3 mLs by nebulization 3 (three) times daily. (Patient taking differently: Take 3 mLs by nebulization 3 (three) times daily as needed.)   mycophenolate (CELLCEPT) 500 MG tablet Take 500 mg by mouth 2 (two) times daily.   pentoxifylline (TRENTAL) 400 MG CR tablet Take 400 mg by mouth 3 (three) times daily.   prazosin (MINIPRESS) 1 MG capsule Take 1 mg by mouth 3 (three) times daily.   predniSONE (DELTASONE) 10 MG tablet Take 4 tabs x 2 days, 2 tabs x 2 days, then 1 tab x 2 days and stop.   Prenatal 27-1 MG TABS Take 1 tablet by mouth daily.   PROAIR HFA 108 (90 Base) MCG/ACT inhaler Inhale 2  puffs into the lungs every 4 (four) hours as needed for wheezing or shortness of breath.   valACYclovir (VALTREX) 500 MG tablet Take 500 mg by mouth daily.              Objective:   Physical Exam  08/16/2020        258 10/05/2019        248  10/03/2018        245  10/02/2017        246 05/09/2017        250   04/18/2016      236   06/22/15 268 lb 6.4 oz (121.745 kg)  06/24/14 250 lb (113.399 kg)  11/03/12 243 lb (110.224 kg)    Vital signs reviewed  08/16/2020  - Note at rest 02 sats  95% on RA   General appearance:    amb bf nad   HEENT : pt wearing mask not removed for exam due to covid -19 concerns.    NECK :  without JVD/Nodes/TM/ nl carotid upstrokes bilaterally   LUNGS: no acc muscle use,  Nl contour chest which is clear to A and P bilaterally without cough on insp or exp maneuvers   CV:  RRR  no s3 or murmur or increase in P2, and no edema   ABD:  soft and nontender with nl inspiratory excursion in the supine position. No bruits or organomegaly appreciated, bowel sounds nl  MS:  Nl gait/ ext warm  unable to straighten fingers/ no calf tenderness, cyanosis or clubbing No obvious joint restrictions   SKIN: warm and dry without lesions    NEURO:  alert, approp, nl sensorium with  no motor or cerebellar deficits apparent.          CXR PA and Lateral:   08/16/2020 :    I personally reviewed images and agree with radiology impression as follows:    Similar appearance of reticulonodular pattern of opacities, although slightly more pronounced than the comparison potentially reflecting either superimposed infection on background of chronic fibrosis versus progression of chronic disease.          Assessment & Plan:

## 2020-08-16 NOTE — Patient Instructions (Addendum)
For cough > delsym 2tsp every 12 hours as needed   Continue nexium  Take 30- 60 min before your first and last meals of the day   GERD (REFLUX)  is an extremely common cause of respiratory symptoms just like yours , many times with no obvious heartburn at all.    It can be treated with medication, but also with lifestyle changes including elevation of the head of your bed (ideally with 6 -8inch blocks under the headboard of your bed),  Smoking cessation, avoidance of late meals, excessive alcohol, and avoid fatty foods, chocolate, peppermint, colas, red wine, and acidic juices such as orange juice.  NO MINT OR MENTHOL PRODUCTS SO NO COUGH DROPS  USE SUGARLESS CANDY INSTEAD (Jolley ranchers or Stover's or Life Savers) or even ice chips will also do - the key is to swallow to prevent all throat clearing. NO OIL BASED VITAMINS - use powdered substitutes.  Avoid fish oil when coughing.   Prednisone 10 mg take  4 each am x 2 days,   2 each am x 2 days,  1 each am x 2 days and stop   Please remember to go to the  x-ray department  for your tests - we will call you with the results when they are available     Please schedule a follow up visit in 3 months but call sooner if needed

## 2020-08-17 ENCOUNTER — Encounter: Payer: Self-pay | Admitting: Internal Medicine

## 2020-08-17 NOTE — Assessment & Plan Note (Signed)
Initial symptom 2006, dx [facility] 2007 - f/u Dr Chilton Greathouse  - HRCT 07/08/13 1. Marked progression of interstitial lung disease. Given the strong craniocaudal gradient, marked progression compared to prior study from 08/03/2004, and presence of honeycombing in the lower lobes of the lungs bilaterally, this pattern is compatible with usual interstitial pneumonia (UIP), presumably a manifestation of the patient's underlying scleroderma. 2. Dilated pulmonic trunk (4 cm in diameter), suggestive of pulmonary arterial hypertension. 3. Dilated esophagus presumably secondary to scleroderma. - Echo 01/28/15 s PH - 06/22/2015  Walked RA x 3 laps @ 185 ft each stopped due to end of study, nl pace, no significant desat or sob.  - symptoms improved on gerd rx 07/21/2015 despite additional month of gestation - CT w/o contrast  Chest  02/10/16  1. Near complete resolution of the extensive patchy areas of ground-glass attenuation seen in both lungs on the 12/30/2015 chest CT, most consistent with nearly resolved pulmonary edema. 2. Trace residual left pleural effusion is decreased. 3. Stable top-normal heart size. Stable chronically dilated main pulmonary artery, likely indicating chronic pulmonary arterial hypertension. 4. Basilar predominant fibrotic interstitial lung disease with honeycombing, mildly progressed since 07/08/2013 high-resolution chest CT study, compatible with usual interstitial pneumonia (UIP) due to scleroderma. - Echo 02/09/16 no PAH  - PFT's  02/09/16  FVC  2.54 (62%) with dloc 35% corrects to 62 % for alv vol  - PFT's  09/03/16  FVC  2.76 (68%)  with DLCO  37 % corrects to 61 % for alv volume    - PFT's  10/02/2017  FVC 2.95    with DLCO  41 % corrects to 60 % for alv volume   - CTa  11/04/19 minimal change ILD  -  10/05/2019   Walked RA  2 laps @ approx 223f each @ fast pace  stopped due to end of study no sob and sast 99% at end - PFT's  11/18/2019  FVC 2.88  (71 % )  with DLCO  16.64  (59%) corrects to 3.77 (89%)  for alv volume     Minimal serial changes on cxr but clinically doing well [facility]   Discussed in detail all the  indications, usual  risks and alternatives  relative to the benefits with patient who agrees to proceed with Rx as outlined.             Each maintenance medication was reviewed in detail including emphasizing most importantly the difference between maintenance and prns and under what circumstances the prns are to be triggered using an action plan format where appropriate.  Total time for H and P, chart review, counseling, reviewing hfa  device(s) and generating customized AVS unique to this acute  office visit / same day charting  > 30 min

## 2020-08-17 NOTE — Progress Notes (Signed)
Subjective:   Patient ID: Laura Rowland, female    DOB: 13-Nov-1975     MRN: [id number]     Brief patient profile:  59  yobf substitute teacher  never smoker never asthma and [place] eval  2007 for sob dx scleroderma and rec rx by rheumatology = shanahan with serial pfts/echo at [facility] while maintaining on plaquenil and nexium referred to pulmonary clinic 06/22/2015 by Dr Derenda Fennel Medicine with ? Asthma     History of Present Illness  06/22/2015 1st [facility] office visit/ Legrand Lasser  @ [redacted] weeks gestation on ppi bid but not ac and saba but no longer ICS  Chief Complaint  Patient presents with   Advice Only    Referred by [name]; Shortness of breath, thinks she may have asthma.   for long as she can remember when cold gets in chest gets severe symptoms of bad cough by codeine goes away eventually recurred in Dec 2016 > UC with cough/ sob / lost voice dx as asthma rx pred/proair and 100% better an no maint then March 2017 same thing  > UC eval rx one treatment and this resolved but started needing saba more but mostly just with exertion maybe once a day and never noct/ or early. Also loses breath talking.  saba helps  At baseline sob walking entire grocery store, never tried the saba first/ avg saba once daily Since onset IUP = slt more sob/ no increase in saba use  rec Pace yourself and walk slower to avoid becoming too short of breath as this is not healthy for oxygen delivery to the baby's oxygen level Only use your albuterol as a rescue medication Change nexium to Take 30- 60 min before your first and last meals of the day  GERD diet     07/21/2015  f/u ov/Yizel Canby re:   IUP 16 weeks / mild asthma/ no saba need since last ov on gerd rx  Chief Complaint  Patient presents with   Follow-up  Has learned to pace herself and no long sob rec No change in recommendations    Nov 29th 2017 c section then 3 days later swelling / hbp / sob > [facility]    Admit date:  12/28/2015 Discharge date: 01/07/2016    HPI: per [name], [name] (patient initially admitted to [facility] hospital then transferred to [facility] ICU) [id number], 1 week s/p repeat CS here with multiple sx. She reports worsening LE edema about 3 days ago after she was discharged from the hospital. She reports onset of SOB last night. She then had onset of dizziness and HA today. She took Percocet and had no relief. She also reports upper abdominal pain bilateral that radiates to her back. Review of chart indicates pregnancy and postpartum course was uncomplicated.   Hospital Course: Discharge Diagnoses:  Active Problems:   Preeclampsia in postpartum period   Acute pulmonary edema (HCC)   Acute respiratory failure with hypoxia (HCC)   Dyspnea   SOB (shortness of breath)   Acute Hypoxic Respiratory Failure - patient admitted to [facility] hospital with hypoxic respiratory fialure due to pulmonary edema in the setting of pre-eclampsia on underlying ILD from scleroderma. She eventually was transferred to [facility] for critical care assistance. She is s/p C section on 11/29. She was aggressively diuresed and her hypoxia resolved and was weaned off to room air. She is net negative 20L and her weight has improved from 260  lbs on admission to 232 lbs on discharge. She was able to ambulate in the hallway on room air maintaining her sats into the 90s without significant dyspnea. CTA on admission negative for PE. ECHO; normal Ef and with diastolic dysfunction. Sputum culture; growing few streptococcus, patient remain afebrile, WBC normal, discussed with CCM would not start antibiotics at this time.  Scleroderma - Continue with Plaquenil, she is followed by Dr. [name]   HTN - she is no longer hypertensive and off of medications    04/18/2016  f/u ov/Ahaan Zobrist re: transiition oc care p admit/  Chronic cough on nexium bid ac in pt with scleroderma Chief Complaint  Patient presents with   Follow-up     Follow up per [facility] for scleroderma, Pt here today c/o increase sob, finding it hard to catch her breath, has a non productive coug, slight wheezing at times, Denies fever  Stayed in bed x 8 weeks p [facility] and now 85% better    Using saba three times a week and no neb need at all  Cough/wheeze worse p exertion / not noct   Has not tried inhaler for cough  Doe = MMRC2 = can't walk a nl pace on a flat grade s sob but does fine slow and flat eg shopping  rec Plan A = Automatic = symbicort 80 Take 2 puffs first thing in am and then another 2 puffs about 12 hours later.  Work on inhaler technique:    Plan B = Backup Only use your albuterol as a rescue medication  Plan C = Crisis - only use your albuterol nebulizer if you first try Plan B and it fails to help > ok to use the nebulizer up to every 4 hours but if start needing it regularly call for immediate appointment  Please schedule a follow up visit in 3 months but call sooner if needed with PFT on return       07/20/2016  f/u ov/Lillyth Spong re: worsening asthma off symbicort  Chief Complaint  Patient presents with   Follow-up    PFT's today. Pt states having increased SOB, wheezing and cough- started approx 1 wk ago while at [employer]. She states she has been using her albuterol inhaler 4-5 x per day and neb 2 x daily since having these symptoms.   after symbicort completed  around 1st May  Stopped it thru June 20th and did  [place] =  95% better - then drove to [place] and on first day more doe in heat walking all over [place]  then noct wheeze much better p albuterol  And since then much more saba need assoc with mostly dry coughing fits 07/17/16 eval [name], d dimer  0.6 > restarted pred and 75% improved since then  rec Plan A = Automatic = symbicort 80 (dulera 100 samples) Take 2 puffs first thing in am and then another 2 puffs about 12 hours later.  Work on inhaler technique:     Plan B = Backup Only use your  albuterol as a rescue medication  Plan C = Crisis - only use your albuterol nebulizer if you first try Plan B and it fails to help > ok to use the nebulizer up to every 4 hours but if start needing it regularly call for immediate appointment    05/09/2017 acute extended ov/Sharad Vaneaton re: scleroderma with worse sob /dry cough  maint on symbicort 80 2bid  Chief Complaint  Patient presents with  Acute Visit    Cough and SOB x 1 wk. Cough has not been prod. She states she feels SOB with or without any exertion.   baseline  gso parking lot >  [place]  Much worse since  05/02/17 and rescue helps/ neb helps more assoc with throat fullness and urge to clear throat but cough is dry and day > noct  On nexium bid ac maint Some better with saba  rec Prednisone 10 mg take  4 each am x 2 days,   2 each am x 2 days,  1 each am x 2 days and stop  If better and stay better > keep appt for August 2019 pfts for scleoderma  If not better, call to do pfts sooner  If better and then get worse, call for double strength symbicort  GERD diet     10/02/2017  f/u ov/Helyne Genther re: scleroderma/ prob cough variant asthma vs uacs Chief Complaint  Patient presents with   Follow-up    WANTS FLU SHOT TODAY;Review PFT: Pt state she is doing pretty good-past few days has had to use her rescue inhaler-one of those happened during the night.   Dyspnea:  MMRC2 = can't walk a nl pace on a flat grade s sob but does fine slow and flat  Cough: no Sleeping: on back on 3 pillows  SABA use: a couple times last week  02: no   rec Good work on your inhaler technique and no change on your medications Work on inhaler technique:   Nexium 40 mg Take 30- 60 min before your first and last meals of the day     10/03/2018  f/u ov/Estes Lehner re: scleroderma on plaquenil per rheum in [place] / cough variant asthma - no recent flares of arthritis/sob  Chief Complaint  Patient presents with   Mild persistent asthma in adult without complication     Medications for asthma have been working well since last visit.  Dyspnea:  Better since increased symb to 160 2bid / able to work out but uses saba first  Cough: no Sleeping: on back one pillow SABA use: none 02: none  rec Only use your albuterol as a rescue medication  Work on inhaler technique:   Yearly f/u    10/05/2019  f/u ov/Jennessa Trigo re:   scleroderma  With new cp x sev weeks / had 3rd covid moderna  Aug 24th 2021 Chief Complaint  Patient presents with   Follow-up    reports intermittent chest pains at night when lying down. denies increase shortness of breath   Dyspnea:  Still working out and not needing any albuterol  Cough: sometimes cough with work out  Sleeping: bed is flat with two pillow  SABA use: none  02: none  Midline ant cp x sev weeks x minutes to maybe an hour  not radiating and then resolves on its own no diaphor, n or v and never with kick boxing  rec Nexium  Take 30- 60 min before your first and last meals of the day  GERD diet  Make sure you check your oxygen saturations at highest level of activity to be sure it stays over 90% and keep track of it at least once a week, more often if breathing getting worse, and let me know if losing ground.  Please schedule a follow up office visit in 6 weeks, call sooner if needed with pfts        11/18/2019  f/u ov/Skylar Priest re: cp was  same location anteriorly persisted and worse lying down / better "on it's own" and has not recurred - ? If occurred before or after booster  Chief Complaint  Patient presents with   Follow-up    PFT's done today. Breathing is unchanged since her last visit. She went to ED 11/04/2019 after having "crushing pain in my chest" for approx 1 wk- pain is now resolved.    Dyspnea:  Not limited by breathing from desired activities  / back to kick boxing  Cough: worse with aerobic activity but may be improving  Sleeping: flat bed two pillows  SABA use: symb 160 no need for rescue 02: none   Rec No  change symbicort  Make sure you check your oxygen saturations at highest level of activity to be sure it stays over 90% and keep track of it at least once a week, more often if breathing getting worse, and let me know if losing ground.  GERD  diet/ bed blocks  08/16/2020 Acute ov/Jhane Lorio re: sob/ cough in pt with scleroderma/ asthma on symb 160  Chief Complaint  Patient presents with   Shortness of Breath    Reports dyspnea improved with recent prednisone  Doing fine s rescue then June 19th onset of fatigue, cough dx covid 07/15/20 rx prednisone/ zpack and temporary cough relief then relief and maint on symbicort 160 and cough came back  Dyspnea:  50 ft  Cough: dry / worse hs  and early in am / no fever  Sleeping: bed is flat / 2 pillows under head SABA use: last used rescue 12 h prior to OV   02: none  Covid status:   vax x 4    No obvious day to day or daytime variability or assoc excess/ purulent sputum or mucus plugs or hemoptysis or cp or chest tightness, subjective wheeze or overt sinus or hb symptoms.    . Also denies any obvious fluctuation of symptoms with weather or environmental changes or other aggravating or alleviating factors except as outlined above   No unusual exposure hx or h/o childhood pna/ asthma or knowledge of premature birth.  Current Allergies, Complete Past Medical History, Past Surgical History, Family History, and Social History were reviewed in [employer] record.  ROS  The following are not active complaints unless bolded Hoarseness, sore throat, dysphagia, dental problems, itching, sneezing,  nasal congestion or discharge of excess mucus or purulent secretions, ear ache,   fever, chills, sweats, unintended wt loss or wt gain, classically pleuritic or exertional cp,  orthopnea pnd or arm/hand swelling  or leg swelling, presyncope, palpitations, abdominal pain, anorexia, nausea, vomiting, diarrhea  or change in bowel habits or change in  bladder habits, change in stools or change in urine, dysuria, hematuria,  rash, arthralgias, visual complaints, headache, numbness, weakness or ataxia or problems with walking or coordination,  change in mood or  memory.        Current Meds  Medication Sig   amLODipine (NORVASC) 10 MG tablet Take 10 mg by mouth daily.   budesonide-formoterol (SYMBICORT) 160-4.5 MCG/ACT inhaler Inhale 2 puffs into the lungs 2 (two) times daily.   Cholecalciferol (VITAMIN D) 2000 UNITS tablet Take 2,000 Units by mouth daily.   diclofenac Sodium (VOLTAREN) 1 % GEL Voltaren 1 % topical gel   esomeprazole (NEXIUM) 40 MG capsule Take 30- 60 min before your first and last meals of the day (Patient taking differently: Take 40 mg by mouth 2 (two) times daily before  a meal. Take 30- 60 min before your first and last meals of the day)   HYDROcodone-acetaminophen (NORCO/VICODIN) 5-325 MG tablet TAKE 1   2 TABLET BY ORAL ROUTE EVERY 8 HOURS AS NEEDED FOR PAIN AS NEEDED(28 DAY SUPPLY)   HYDROmorphone (DILAUDID) 4 MG tablet Take 1 tablet (4 mg total) by mouth every 6 (six) hours as needed for severe pain.   hydroxychloroquine (PLAQUENIL) 200 MG tablet Take 200 mg by mouth 3 (three) times daily. Pt takes 2 tablets '400mg'$  in the morning and one tablet '200mg'$  at night   ibuprofen (ADVIL,MOTRIN) 800 MG tablet Take 800 mg by mouth every 8 (eight) hours as needed.   ipratropium-albuterol (DUONEB) 0.5-2.5 (3) MG/3ML SOLN Take 3 mLs by nebulization 3 (three) times daily. (Patient taking differently: Take 3 mLs by nebulization 3 (three) times daily as needed.)   mycophenolate (CELLCEPT) 500 MG tablet Take 500 mg by mouth 2 (two) times daily.   pentoxifylline (TRENTAL) 400 MG CR tablet Take 400 mg by mouth 3 (three) times daily.   prazosin (MINIPRESS) 1 MG capsule Take 1 mg by mouth 3 (three) times daily.   predniSONE (DELTASONE) 10 MG tablet Take 4 tabs x 2 days, 2 tabs x 2 days, then 1 tab x 2 days and stop.   Prenatal 27-1 MG TABS Take 1  tablet by mouth daily.   PROAIR HFA 108 (90 Base) MCG/ACT inhaler Inhale 2 puffs into the lungs every 4 (four) hours as needed for wheezing or shortness of breath.   valACYclovir (VALTREX) 500 MG tablet Take 500 mg by mouth daily.                              Objective:   Physical Exam  08/16/2020        258 10/05/2019        248  10/03/2018        245  10/02/2017        246 05/09/2017        250   04/18/2016      236   06/22/15 268 lb 6.4 oz (121.745 kg)  06/24/14 250 lb (113.399 kg)  11/03/12 243 lb (110.224 kg)     Vital signs reviewed  08/16/2020  - Note at rest 02 sats  95% on RA   General appearance:    amb bf nad         HEENT : pt wearing mask not removed for exam due to covid -19 concerns.    NECK :  without JVD/Nodes/TM/ nl carotid upstrokes bilaterally   LUNGS: no acc muscle use,  Nl contour chest which is clear to A and P bilaterally with  cough on   exp maneuvers   CV:  RRR  no s3 or murmur or increase in P2, and no edema   ABD:  soft and nontender with nl inspiratory excursion in the supine position. No bruits or organomegaly appreciated, bowel sounds nl  MS:  Nl gait/ ext warm with inability to straighten fingers bilaterally - no, calf tenderness, cyanosis or clubbing No obvious joint restrictions   SKIN: warm and dry without lesions    NEURO:  alert, approp, nl sensorium with  no motor or cerebellar deficits apparent.     CXR PA and Lateral:   08/16/2020 :    I personally reviewed images and agree with radiology impression as follows:   Similar appearance of reticulonodular pattern of opacities,  although slightly more pronounced than the comparison potentially reflecting either superimposed infection on background of chronic fibrosis versus progression of chronic disease.       Assessment & Plan:

## 2020-08-17 NOTE — Assessment & Plan Note (Signed)
PFT's  01/28/15    FEV1 2.02 (60 % ) ratio 65   with DLCO  47 % corrects to 68 % for alv volume - 04/18/2016   try symb 80 2bid  > d/c May 22 2016 and flared off symbicort at [facility] June 2018  - PFT's  07/20/2016  FEV1 1.90 (57 % ) ratio 76  p 9 % improvement from saba p nothing prior to study (while on pred) with DLCO  42 % corrects to 67  % for alv volume   - PFT's  10/02/2017  FEV1 2.15  (65 % ) ratio 73  p no % improvement from saba p symb 80 x 2  prior to study with DLCO  41 % corrects to 60 % for alv volume   - 11/18/2019  After extensive coaching inhaler device,  effectiveness =    90% > continue symbicort 160 2bid   - PFT's  11/18/2019  FEV1 2.16 (66 % ) ratio 0.75  p 7 % improvement from saba   with DLCO  16.64 (59%) corrects to 3.77 (89%)  for alv volume and FV curve classic mild concavity    Mild flare p covid with minimal changes on cxr but persistent cough so rec repeat prednisone x 6 days and max rx for gerd/ avoid narcotics to control cough if possible and replace with delsym/ f/u prn

## 2020-08-17 NOTE — Progress Notes (Signed)
Spoke with pt and notified of results per Dr. Wert. Pt verbalized understanding and denied any questions. 

## 2020-09-06 DIAGNOSIS — Z0000 Encounter for general adult medical examination without abnormal findings: Secondary | ICD-10-CM | POA: Diagnosis not present

## 2020-09-09 ENCOUNTER — Other Ambulatory Visit: Payer: Self-pay | Admitting: Obstetrics and Gynecology

## 2020-09-09 DIAGNOSIS — R7303 Prediabetes: Secondary | ICD-10-CM | POA: Diagnosis not present

## 2020-09-09 DIAGNOSIS — E559 Vitamin D deficiency, unspecified: Secondary | ICD-10-CM | POA: Diagnosis not present

## 2020-09-09 DIAGNOSIS — Z1231 Encounter for screening mammogram for malignant neoplasm of breast: Secondary | ICD-10-CM

## 2020-09-09 DIAGNOSIS — R5383 Other fatigue: Secondary | ICD-10-CM | POA: Diagnosis not present

## 2020-09-20 DIAGNOSIS — I2721 Secondary pulmonary arterial hypertension: Secondary | ICD-10-CM | POA: Diagnosis not present

## 2020-09-20 DIAGNOSIS — I5089 Other heart failure: Secondary | ICD-10-CM | POA: Diagnosis not present

## 2020-09-20 DIAGNOSIS — M3489 Other systemic sclerosis: Secondary | ICD-10-CM | POA: Diagnosis not present

## 2020-09-20 DIAGNOSIS — R0602 Shortness of breath: Secondary | ICD-10-CM | POA: Diagnosis not present

## 2020-09-20 DIAGNOSIS — Z79899 Other long term (current) drug therapy: Secondary | ICD-10-CM | POA: Diagnosis not present

## 2020-09-23 ENCOUNTER — Other Ambulatory Visit: Payer: Self-pay | Admitting: Internal Medicine

## 2020-09-23 DIAGNOSIS — R0600 Dyspnea, unspecified: Secondary | ICD-10-CM

## 2020-09-23 DIAGNOSIS — R7989 Other specified abnormal findings of blood chemistry: Secondary | ICD-10-CM

## 2020-10-13 ENCOUNTER — Ambulatory Visit
Admission: RE | Admit: 2020-10-13 | Discharge: 2020-10-13 | Disposition: A | Discharge status: Home / Self Care | Payer: Medicare Other | Source: Ambulatory Visit | Attending: Obstetrics and Gynecology | Admitting: Obstetrics and Gynecology

## 2020-10-13 ENCOUNTER — Other Ambulatory Visit: Payer: Self-pay

## 2020-10-13 DIAGNOSIS — Z1231 Encounter for screening mammogram for malignant neoplasm of breast: Secondary | ICD-10-CM

## 2020-11-04 ENCOUNTER — Ambulatory Visit
Admission: RE | Admit: 2020-11-04 | Discharge: 2020-11-04 | Disposition: A | Discharge status: Home / Self Care | Payer: Medicare Other | Source: Ambulatory Visit | Attending: Internal Medicine | Admitting: Internal Medicine

## 2020-11-04 DIAGNOSIS — R0600 Dyspnea, unspecified: Secondary | ICD-10-CM | POA: Diagnosis not present

## 2020-11-04 DIAGNOSIS — J479 Bronchiectasis, uncomplicated: Secondary | ICD-10-CM | POA: Diagnosis not present

## 2020-11-04 DIAGNOSIS — R7989 Other specified abnormal findings of blood chemistry: Secondary | ICD-10-CM

## 2020-11-04 DIAGNOSIS — R5383 Other fatigue: Secondary | ICD-10-CM | POA: Diagnosis not present

## 2020-11-04 MED ORDER — IOPAMIDOL (ISOVUE-370) INJECTION 76%
75.0000 mL | Freq: Once | INTRAVENOUS | Status: AC | PRN
  Administered 2020-11-04: 75 mL via INTRAVENOUS

## 2020-11-18 ENCOUNTER — Ambulatory Visit: Payer: Medicare Other | Admitting: Internal Medicine

## 2020-11-21 ENCOUNTER — Ambulatory Visit: Payer: Medicare Other | Admitting: Internal Medicine

## 2020-11-25 ENCOUNTER — Other Ambulatory Visit: Payer: Self-pay

## 2020-11-25 MED ORDER — PROAIR HFA 108 (90 BASE) MCG/ACT IN AERS
2.0000 | INHALATION_SPRAY | RESPIRATORY_TRACT | 11 refills | Status: DC | PRN
Start: 2020-11-25 — End: 2020-11-25

## 2020-11-25 MED ORDER — PROAIR HFA 108 (90 BASE) MCG/ACT IN AERS: 2.0000 | INHALATION_SPRAY | RESPIRATORY_TRACT | 11 refills | Status: DC | PRN

## 2020-12-05 ENCOUNTER — Encounter: Payer: Self-pay | Admitting: Internal Medicine

## 2020-12-05 ENCOUNTER — Other Ambulatory Visit: Payer: Self-pay

## 2020-12-05 ENCOUNTER — Ambulatory Visit (INDEPENDENT_AMBULATORY_CARE_PROVIDER_SITE_OTHER): Payer: Medicare Other | Admitting: Internal Medicine

## 2020-12-05 DIAGNOSIS — M349 Systemic sclerosis, unspecified: Secondary | ICD-10-CM | POA: Diagnosis not present

## 2020-12-05 DIAGNOSIS — J4530 Mild persistent asthma, uncomplicated: Secondary | ICD-10-CM | POA: Diagnosis not present

## 2020-12-05 MED ORDER — IPRATROPIUM-ALBUTEROL 0.5-2.5 (3) MG/3ML IN SOLN: 3.0000 mL | RESPIRATORY_TRACT | 2 refills | Status: AC | PRN

## 2020-12-05 MED ORDER — PROAIR HFA 108 (90 BASE) MCG/ACT IN AERS: 2.0000 | INHALATION_SPRAY | RESPIRATORY_TRACT | 11 refills | Status: DC | PRN

## 2020-12-05 NOTE — Progress Notes (Signed)
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 Asthma     History of Present Illness  06/22/2015 1st [facility] office visit/ Laura Rowland  @ [redacted] weeks gestation on ppi bid but not ac and saba but no longer ICS  Chief Complaint  Patient presents with   Advice Only    Referred by [name]; Shortness of breath, thinks she may have asthma.   for long as she can remember when cold gets in chest gets severe symptoms of bad cough by codeine goes away eventually recurred in Dec 2016 > UC with cough/ sob / lost voice dx as asthma rx pred/proair and 100% better an no maint then March 2017 same thing  > UC eval rx one treatment and this resolved but started needing saba more but mostly just with exertion maybe once a day and never noct/ or early. Also loses breath talking.  saba helps  At baseline sob walking entire grocery store, never tried the saba first/ avg saba once daily Since onset IUP = slt more sob/ no increase in saba use  rec Pace yourself and walk slower to avoid becoming too short of breath as this is not healthy for oxygen delivery to the baby's oxygen level Only use your albuterol as a rescue medication Change nexium to Take 30- 60 min before your first and last meals of the day  GERD diet     07/21/2015  f/u ov/Laura Rowland re:   IUP 16 weeks / mild asthma/ no saba need since last ov on gerd rx  Chief Complaint  Patient presents with   Follow-up  Has learned to pace herself and no long sob rec No change in recommendations    Nov 29th 2017 c section then 3 days later swelling / hbp / sob > [facility]    Admit date:  12/28/2015 Discharge date: 01/07/2016    HPI: per [name], [name] (patient initially admitted to [facility] hospital then transferred to [facility]) [id number], 1 week s/p repeat CS here with multiple sx. She reports worsening LE edema about 3 days ago after she was discharged from the hospital. She reports onset of SOB last night. She then had onset of dizziness and HA today. She took Percocet and had no relief. She also reports upper abdominal pain bilateral that radiates to her back. Review of chart indicates pregnancy and postpartum course was uncomplicated.   Hospital Course: Discharge Diagnoses:  Active Problems:   Preeclampsia in postpartum period   Acute pulmonary edema (HCC)   Acute respiratory failure with hypoxia (HCC)   Dyspnea   SOB (shortness of breath)   Acute Hypoxic Respiratory Failure - patient admitted to [facility] hospital with hypoxic respiratory fialure due to pulmonary edema in the setting of pre-eclampsia on underlying ILD from scleroderma. She eventually was transferred to [facility] for critical care assistance. She is s/p C section on 11/29. She was aggressively diuresed and her hypoxia resolved and was weaned off to room air. She is net negative 20L and her weight has improved from 260 lbs on  admission to 232 lbs on discharge. She was able to ambulate in the hallway on room air maintaining her sats into the 90s without significant dyspnea. CTA on admission negative for PE. ECHO; normal Ef and with diastolic dysfunction. Sputum culture; growing few streptococcus, patient remain afebrile, WBC normal, discussed with CCM would not start antibiotics at this time.  Scleroderma - Continue with Plaquenil, she is followed by Dr. [name]   HTN - she is no longer hypertensive and off of medications    04/18/2016  f/u ov/Laura Rowland re: transiition oc care p admit/  Chronic cough on nexium bid ac in pt with scleroderma Chief Complaint  Patient presents with   Follow-up     Follow up per [facility] for scleroderma, Pt here today c/o increase sob, finding it hard to catch her breath, has a non productive coug, slight wheezing at times, Denies fever  Stayed in bed x 8 weeks p [facility] and now 85% better    Using saba three times a week and no neb need at all  Cough/wheeze worse p exertion / not noct   Has not tried inhaler for cough  Doe = MMRC2 = can't walk a nl pace on a flat grade s sob but does fine slow and flat eg shopping  rec Plan A = Automatic = symbicort 80 Take 2 puffs first thing in am and then another 2 puffs about 12 hours later.  Work on inhaler technique:    Plan B = Backup Only use your albuterol as a rescue medication  Plan C = Crisis - only use your albuterol nebulizer if you first try Plan B and it fails to help > ok to use the nebulizer up to every 4 hours but if start needing it regularly call for immediate appointment  Please schedule a follow up visit in 3 months but call sooner if needed with PFT on return       07/20/2016  f/u ov/Laura Rowland re: worsening asthma off symbicort  Chief Complaint  Patient presents with   Follow-up    PFT's today. Pt states having increased SOB, wheezing and cough- started approx 1 wk ago while at [employer]. She states she has been using her albuterol inhaler 4-5 x per day and neb 2 x daily since having these symptoms.   after symbicort completed  around 1st May  Stopped it thru June 20th and did  [place] =  95% better - then drove to [place] and on first day more doe in heat walking all over [place]  then noct wheeze much better p albuterol  And since then much more saba need assoc with mostly dry coughing fits 07/17/16 eval [name], d dimer  0.6 > restarted pred and 75% improved since then  rec Plan A = Automatic = symbicort 80 (dulera 100 samples) Take 2 puffs first thing in am and then another 2 puffs about 12 hours later.  Work on inhaler technique:     Plan B = Backup Only use your  albuterol as a rescue medication  Plan C = Crisis - only use your albuterol nebulizer if you first try Plan B and it fails to help > ok to use the nebulizer up to every 4 hours but if start needing it regularly call for immediate appointment    10/05/2019  f/u ov/Laura Rowland re:   scleroderma  With new cp x sev weeks / had 3rd covid [facility]  Aug 24th 2021 Chief Complaint  Patient  presents with   Follow-up    reports intermittent chest pains at night when lying down. denies increase shortness of breath   Dyspnea:  Still working out and not needing any albuterol  Cough: sometimes cough with work out  Sleeping: bed is flat with two pillow  SABA use: none  02: none  Midline ant cp x sev weeks x minutes to maybe an hour  not radiating and then resolves on its own no diaphor, n or v and never with kick boxing  rec Nexium  Take 30- 60 min before your first and last meals of the day  GERD diet  Make sure you check your oxygen saturations at highest level of activity to be sure it stays over 90% and keep track of it at least once a week, more often if breathing getting worse, and let me know if losing ground.  Please schedule a follow up office visit in 6 weeks, call sooner if needed with pfts     08/16/2020 Acute ov/Laura Rowland re: sob/ cough in pt with scleroderma/ asthma on symb 160  Chief Complaint  Patient presents with   Shortness of Breath    Reports dyspnea improved with recent prednisone  Doing fine s rescue then June 19th onset of fatigue, cough dx covid 07/15/20 rx prednisone/ zpack and temporary cough relief then relief and maint on symbicort 160 and cough came back  Dyspnea:  50 ft  Cough: dry / worse hs  and early in am / no fever  Sleeping: bed is flat / 2 pillows under head SABA use: last used rescue 12 h prior to OV   02: none  Covid status:   vax x 4  Rec For cough > delsym 2tsp every 12 hours as needed  Continue nexium  Take 30- 60 min before your first and last meals of the day  GERD  diet/ lifestyle  Prednisone 10 mg take  4 each am x 2 days,   2 each am x 2 days,  1 each am x 2 days and stop  Please remember to go to the  x-ray department  f    12/05/2020  f/u ov/Laura Rowland re: scleroderma / cough variant asthma   maint on symb 160/ gerd rx plaquenil and cell cept  Chief Complaint  Patient presents with   Follow-up    3-4 mo f/u. States she has been doing well since last visit.    Dyspnea:  about the same x years = 50 ft / can't consistently do shopping  Cough: better on symbicort  Sleeping: flat bed 2 pillows  SABA use: proair/neb not needing 02: none Covid status:   vax x 4/ and infected June 2022    No obvious day to day or daytime variability or assoc excess/ purulent sputum or mucus plugs or hemoptysis or cp or chest tightness, subjective wheeze or overt sinus or hb symptoms.   Sleeping as above without nocturnal  or early am exacerbation  of respiratory  c/o's or need for noct saba. Also denies any obvious fluctuation of symptoms with weather or environmental changes or other aggravating or alleviating factors except as outlined above   No unusual exposure hx or h/o childhood pna/ asthma or knowledge of premature birth.  Current Allergies, Complete Past Medical History, Past Surgical History, Family History, and Social History were reviewed in [employer] record.  ROS  The following are not active complaints unless bolded Hoarseness, sore throat, dysphagia, dental problems, itching, sneezing,  nasal congestion or discharge of excess mucus or purulent secretions, ear ache,   fever, chills, sweats, unintended wt loss or wt gain, classically pleuritic or exertional cp,  orthopnea pnd or arm/hand swelling  or leg swelling, presyncope, palpitations, abdominal pain, anorexia, nausea, vomiting, diarrhea  or change in bowel habits or change in bladder habits, change in stools or change in urine, dysuria, hematuria,  rash, arthralgias, visual  complaints, headache, numbness, weakness or ataxia or problems with walking or coordination,  change in mood or  memory.        Current Meds  Medication Sig   amLODipine (NORVASC) 10 MG tablet Take 10 mg by mouth daily.   budesonide-formoterol (SYMBICORT) 160-4.5 MCG/ACT inhaler Inhale 2 puffs into the lungs 2 (two) times daily.   Cholecalciferol (VITAMIN D) 2000 UNITS tablet Take 2,000 Units by mouth daily.   diclofenac Sodium (VOLTAREN) 1 % GEL Voltaren 1 % topical gel   esomeprazole (NEXIUM) 40 MG capsule Take 30- 60 min before your first and last meals of the day (Patient taking differently: Take 40 mg by mouth 2 (two) times daily before a meal. Take 30- 60 min before your first and last meals of the day)   hydroxychloroquine (PLAQUENIL) 200 MG tablet Take 200 mg by mouth 3 (three) times daily. Pt takes 2 tablets 400mg  in the morning and one tablet 200mg  at night   ibuprofen (ADVIL,MOTRIN) 800 MG tablet Take 800 mg by mouth every 8 (eight) hours as needed.   ipratropium-albuterol (DUONEB) 0.5-2.5 (3) MG/3ML SOLN Take 3 mLs by nebulization 3 (three) times daily. (Patient taking differently: Take 3 mLs by nebulization 3 (three) times daily as needed.)   mycophenolate (CELLCEPT) 500 MG tablet Take 500 mg by mouth 2 (two) times daily.   pentoxifylline (TRENTAL) 400 MG CR tablet Take 400 mg by mouth 3 (three) times daily.   prazosin (MINIPRESS) 1 MG capsule Take 1 mg by mouth 3 (three) times daily.   Prenatal 27-1 MG TABS Take 1 tablet by mouth daily.   PROAIR HFA 108 (90 Base) MCG/ACT inhaler Inhale 2 puffs into the lungs every 4 (four) hours as needed for wheezing or shortness of breath.   valACYclovir (VALTREX) 500 MG tablet Take 500 mg by mouth daily.                                Objective:   Physical Exam   12/05/2020       254   08/16/2020        258 10/05/2019        248  10/03/2018        245  10/02/2017        246 05/09/2017        250   04/18/2016      236   06/22/15  268 lb 6.4 oz (121.745 kg)  06/24/14 250 lb (113.399 kg)  11/03/12 243 lb (110.224 kg)    Vital signs reviewed  12/05/2020  - Note at rest 02 sats  97% on RA   General appearance:    amb pleasant bf nad    HEENT : pt wearing mask not removed for exam due to covid -19 concerns.    NECK :  without JVD/Nodes/TM/ nl carotid upstrokes bilaterally   LUNGS: no acc muscle use,  Nl contour chest which is clear to A and P bilaterally without cough on insp or exp maneuvers  CV:  RRR  no s3 or murmur or increase in P2, and no edema   ABD:  soft and nontender with nl inspiratory excursion in the supine position. No bruits or organomegaly appreciated, bowel sounds nl  MS:  Nl gait/ ext warm without deformities, calf tenderness, cyanosis or clubbing - can't fully extend fingers     SKIN: warm and dry without lesions    NEURO:  alert, approp, nl sensorium with  no motor or cerebellar deficits apparent.     I personally reviewed images and agree with radiology impression as follows:   Chest CTa  11/04/20  1. Negative examination for pulmonary embolism. 2. Slight interval worsening of bibasilar predominant fibrosis featuring subpleural bronchiolectasis and honeycombing at the lung bases, with numerous associated small pulmonary nodules, generally concentrated along pleura and fissures. Pulmonary nodules are unchanged compared to prior examination and benign. Constellation of findings is consistent with scleroderma related pulmonary fibrosis. 3. Trace bilateral pleural effusions.    Assessment & Plan:

## 2020-12-05 NOTE — Patient Instructions (Addendum)
Plan A = Automatic = Always=    Symbicort 160 Take 2 puffs first thing in am and then another 2 puffs about 12 hours later.     Plan B = Backup (to supplement plan A, not to replace it) Only use your albuterol (PROAIR) inhaler as a rescue medication to be used if you can't catch your breath by resting or doing a relaxed purse lip breathing pattern.  - The less you use it, the better it will work when you need it. - Ok to use the inhaler up to 2 puffs  every 4 hours if you must but call for appointment if use goes up over your usual need - Don't leave home without it !!  (think of it like the spare tire for your car)   Plan C = Crisis (instead of Plan B but only if Plan B stops working) - only use your albuterol nebulizer if you first try Plan B and it fails to help > ok to use the nebulizer up to every 4 hours but if start needing it regularly call for immediate appointment  Please schedule a follow up office visit in 6 months , call sooner if needed

## 2020-12-06 ENCOUNTER — Encounter: Payer: Self-pay | Admitting: Internal Medicine

## 2020-12-06 NOTE — Assessment & Plan Note (Signed)
Initial symptom 2006, dx [facility] 2007 - f/u Dr Chilton Greathouse  - HRCT 07/08/13 1. Marked progression of interstitial lung disease. Given the strong craniocaudal gradient, marked progression compared to prior study from 08/03/2004, and presence of honeycombing in the lower lobes of the lungs bilaterally, this pattern is compatible with usual interstitial pneumonia (UIP), presumably a manifestation of the patient's underlying scleroderma. 2. Dilated pulmonic trunk (4 cm in diameter), suggestive of pulmonary arterial hypertension. 3. Dilated esophagus presumably secondary to scleroderma. - Echo 01/28/15 s PH - 06/22/2015  Walked RA x 3 laps @ 185 ft each stopped due to end of study, nl pace, no significant desat or sob.  - symptoms improved on gerd rx 07/21/2015 despite additional month of gestation - CT w/o contrast  Chest  02/10/16  1. Near complete resolution of the extensive patchy areas of ground-glass attenuation seen in both lungs on the 12/30/2015 chest CT, most consistent with nearly resolved pulmonary edema. 2. Trace residual left pleural effusion is decreased. 3. Stable top-normal heart size. Stable chronically dilated main pulmonary artery, likely indicating chronic pulmonary arterial hypertension. 4. Basilar predominant fibrotic interstitial lung disease with honeycombing, mildly progressed since 07/08/2013 high-resolution chest CT study, compatible with usual interstitial pneumonia (UIP) due to scleroderma. - Echo 02/09/16 no PAH  - PFT's  02/09/16  FVC  2.54 (62%) with dloc 35% corrects to 62 % for alv vol  - PFT's  09/03/16  FVC  2.76 (68%)  with DLCO  37 % corrects to 61 % for alv volume    - PFT's  10/02/2017  FVC 2.95    with DLCO  41 % corrects to 60 % for alv volume   - CTa  11/04/19 minimal change ILD  -  10/05/2019   Walked RA  2 laps @ approx 255ft each @ fast pace  stopped due to end of study no sob and sast 99% at end - PFT's  11/18/2019  FVC 2.88  (71 % )  with DLCO  16.64  (59%) corrects to 3.77 (89%)  for alv volume    - 12/05/2020   Walked on RAx  one  lap(s) =  approx 250 @ slow pace, stopped due to sob  with lowest 02 sats 95%   In absence of evidence of airflow obst or desaturation I suspect she has significant deconditioning at this point so rec paced activity as tol and f/u with rheum as planned with f/u here q 6, sooner if needed   Each maintenance medication was reviewed in detail including emphasizing most importantly the difference between maintenance and prns and under what circumstances the prns are to be triggered using an action plan format where appropriate.  Total time for H and P, chart review, counseling, reviewing hfa device(s) , directly observing portions of ambulatory 02 saturation study/ and generating customized AVS unique to this office visit / same day charting = 20 min

## 2020-12-06 NOTE — Assessment & Plan Note (Signed)
PFT's  01/28/15    FEV1 2.02 (60 % ) ratio 65   with DLCO  47 % corrects to 68 % for alv volume - 04/18/2016   try symb 80 2bid  > d/c May 22 2016 and flared off symbicort at [facility] June 2018  - PFT's  07/20/2016  FEV1 1.90 (57 % ) ratio 76  p 9 % improvement from saba p nothing prior to study (while on pred) with DLCO  42 % corrects to 67  % for alv volume   - PFT's  10/02/2017  FEV1 2.15  (65 % ) ratio 73  p no % improvement from saba p symb 80 x 2  prior to study with DLCO  41 % corrects to 60 % for alv volume   - 11/18/2019  After extensive coaching inhaler device,  effectiveness =    90% > continue symbicort 160 2bid   - PFT's  11/18/2019  FEV1 2.16 (66 % ) ratio 0.75  p 7 % improvement from saba   with DLCO  16.64 (59%) corrects to 3.77 (89%)  for alv volume and FV curve classic mild concavity     All goals of chronic asthma control met including optimal function and elimination of symptoms with minimal need for rescue therapy.  Contingencies discussed in full including contacting this office immediately if not controlling the symptoms using the rule of two's.

## 2021-02-14 ENCOUNTER — Other Ambulatory Visit: Payer: Self-pay | Admitting: Internal Medicine

## 2021-03-21 ENCOUNTER — Other Ambulatory Visit: Payer: Self-pay | Admitting: Internal Medicine

## 2021-03-21 ENCOUNTER — Other Ambulatory Visit (HOSPITAL_COMMUNITY): Payer: Self-pay | Admitting: Internal Medicine

## 2021-03-21 DIAGNOSIS — Z79899 Other long term (current) drug therapy: Secondary | ICD-10-CM | POA: Diagnosis not present

## 2021-03-21 DIAGNOSIS — I5089 Other heart failure: Secondary | ICD-10-CM | POA: Diagnosis not present

## 2021-03-21 DIAGNOSIS — E041 Nontoxic single thyroid nodule: Secondary | ICD-10-CM

## 2021-03-21 DIAGNOSIS — I270 Primary pulmonary hypertension: Secondary | ICD-10-CM | POA: Diagnosis not present

## 2021-03-21 DIAGNOSIS — J849 Interstitial pulmonary disease, unspecified: Secondary | ICD-10-CM | POA: Diagnosis not present

## 2021-03-21 DIAGNOSIS — M3489 Other systemic sclerosis: Secondary | ICD-10-CM | POA: Diagnosis not present

## 2021-03-22 ENCOUNTER — Other Ambulatory Visit (HOSPITAL_COMMUNITY): Payer: Self-pay | Admitting: Radiology

## 2021-03-22 DIAGNOSIS — J849 Interstitial pulmonary disease, unspecified: Secondary | ICD-10-CM

## 2021-03-23 ENCOUNTER — Ambulatory Visit
Admission: RE | Admit: 2021-03-23 | Discharge: 2021-03-23 | Disposition: A | Discharge status: Home / Self Care | Payer: Medicare Other | Source: Ambulatory Visit | Attending: Internal Medicine | Admitting: Internal Medicine

## 2021-03-23 ENCOUNTER — Other Ambulatory Visit: Payer: Self-pay

## 2021-03-23 DIAGNOSIS — E041 Nontoxic single thyroid nodule: Secondary | ICD-10-CM | POA: Diagnosis not present

## 2021-03-23 NOTE — Progress Notes (Signed)
COVID + test result reported to Laura Rowland with respiratory.  ?

## 2021-03-24 DIAGNOSIS — Z7189 Other specified counseling: Secondary | ICD-10-CM | POA: Diagnosis not present

## 2021-03-24 LAB — SARS CORONAVIRUS 2 (TAT 6-24 HRS): SARS Coronavirus 2: POSITIVE — AB

## 2021-03-25 DIAGNOSIS — Z03818 Encounter for observation for suspected exposure to other biological agents ruled out: Secondary | ICD-10-CM | POA: Diagnosis not present

## 2021-03-25 DIAGNOSIS — Z20822 Contact with and (suspected) exposure to covid-19: Secondary | ICD-10-CM | POA: Diagnosis not present

## 2021-03-27 ENCOUNTER — Inpatient Hospital Stay (HOSPITAL_COMMUNITY): Admission: RE | Admit: 2021-03-27 | Payer: Medicare Other | Source: Ambulatory Visit

## 2021-03-27 DIAGNOSIS — Z01818 Encounter for other preprocedural examination: Secondary | ICD-10-CM

## 2021-04-05 ENCOUNTER — Other Ambulatory Visit: Payer: Self-pay

## 2021-04-05 ENCOUNTER — Ambulatory Visit (HOSPITAL_COMMUNITY)
Admission: RE | Admit: 2021-04-05 | Discharge: 2021-04-05 | Disposition: A | Discharge status: Home / Self Care | Payer: Medicare Other | Source: Ambulatory Visit | Attending: Internal Medicine | Admitting: Internal Medicine

## 2021-04-05 DIAGNOSIS — I272 Pulmonary hypertension, unspecified: Secondary | ICD-10-CM | POA: Insufficient documentation

## 2021-04-05 DIAGNOSIS — R079 Chest pain, unspecified: Secondary | ICD-10-CM | POA: Diagnosis not present

## 2021-04-05 DIAGNOSIS — M349 Systemic sclerosis, unspecified: Secondary | ICD-10-CM | POA: Insufficient documentation

## 2021-04-05 DIAGNOSIS — I5089 Other heart failure: Secondary | ICD-10-CM

## 2021-04-05 DIAGNOSIS — E119 Type 2 diabetes mellitus without complications: Secondary | ICD-10-CM | POA: Diagnosis not present

## 2021-04-05 DIAGNOSIS — R0602 Shortness of breath: Secondary | ICD-10-CM | POA: Diagnosis not present

## 2021-04-05 DIAGNOSIS — I5040 Unspecified combined systolic (congestive) and diastolic (congestive) heart failure: Secondary | ICD-10-CM | POA: Insufficient documentation

## 2021-04-05 DIAGNOSIS — R0600 Dyspnea, unspecified: Secondary | ICD-10-CM | POA: Diagnosis not present

## 2021-04-05 LAB — ECHOCARDIOGRAM COMPLETE
Area-P 1/2: 4.06 cm2
Calc EF: 59 %
S' Lateral: 2.8 cm
Single Plane A2C EF: 65.4 %
Single Plane A4C EF: 56.5 %

## 2021-04-05 NOTE — Progress Notes (Signed)
?  Echocardiogram ?2D Echocardiogram has been performed. ? ?Laura Rowland ?04/05/2021, 4:12 PM ?

## 2021-04-06 LAB — SARS CORONAVIRUS 2 (TAT 6-24 HRS): SARS Coronavirus 2: NEGATIVE

## 2021-05-05 ENCOUNTER — Ambulatory Visit (HOSPITAL_COMMUNITY)
Admission: RE | Admit: 2021-05-05 | Discharge: 2021-05-05 | Disposition: A | Discharge status: Home / Self Care | Payer: Medicare Other | Source: Ambulatory Visit | Attending: Internal Medicine | Admitting: Internal Medicine

## 2021-05-05 DIAGNOSIS — J849 Interstitial pulmonary disease, unspecified: Secondary | ICD-10-CM | POA: Diagnosis not present

## 2021-05-05 LAB — PULMONARY FUNCTION TEST
DL/VA % pred: 77 %
DL/VA: 3.21 ml/min/mmHg/L
DLCO unc % pred: 46 %
DLCO unc: 12.96 ml/min/mmHg
FEF 25-75 Pre: 1.35 L/sec
FEF2575-%Pred-Pre: 40 %
FEV1-%Pred-Pre: 62 %
FEV1-Pre: 2.02 L
FEV1FVC-%Pred-Pre: 85 %
FEV6-%Pred-Pre: 72 %
FEV6-Pre: 2.84 L
FEV6FVC-%Pred-Pre: 101 %
FVC-%Pred-Pre: 71 %
FVC-Pre: 2.86 L
Pre FEV1/FVC ratio: 71 %
Pre FEV6/FVC Ratio: 99 %
RV % pred: 92 %
RV: 1.9 L
TLC % pred: 77 %
TLC: 4.82 L

## 2021-05-05 MED ORDER — ALBUTEROL SULFATE (2.5 MG/3ML) 0.083% IN NEBU: 2.5000 mg | INHALATION_SOLUTION | Freq: Once | RESPIRATORY_TRACT | Status: DC

## 2021-05-15 ENCOUNTER — Other Ambulatory Visit: Payer: Self-pay | Admitting: Internal Medicine

## 2021-05-15 DIAGNOSIS — E041 Nontoxic single thyroid nodule: Secondary | ICD-10-CM

## 2021-05-24 ENCOUNTER — Other Ambulatory Visit (HOSPITAL_COMMUNITY)
Admission: RE | Admit: 2021-05-24 | Discharge: 2021-05-24 | Disposition: A | Discharge status: Home / Self Care | Payer: Medicare Other | Source: Ambulatory Visit | Attending: Student | Admitting: Student

## 2021-05-24 ENCOUNTER — Ambulatory Visit
Admission: RE | Admit: 2021-05-24 | Discharge: 2021-05-24 | Disposition: A | Discharge status: Home / Self Care | Payer: Medicare Other | Source: Ambulatory Visit | Attending: Internal Medicine | Admitting: Internal Medicine

## 2021-05-24 DIAGNOSIS — E041 Nontoxic single thyroid nodule: Secondary | ICD-10-CM

## 2021-05-25 LAB — CYTOLOGY - NON PAP

## 2021-05-29 DIAGNOSIS — R051 Acute cough: Secondary | ICD-10-CM | POA: Diagnosis not present

## 2021-06-08 DIAGNOSIS — J8410 Pulmonary fibrosis, unspecified: Secondary | ICD-10-CM | POA: Diagnosis not present

## 2021-06-08 DIAGNOSIS — M349 Systemic sclerosis, unspecified: Secondary | ICD-10-CM | POA: Diagnosis not present

## 2021-06-08 DIAGNOSIS — R059 Cough, unspecified: Secondary | ICD-10-CM | POA: Diagnosis not present

## 2021-06-13 DIAGNOSIS — H04123 Dry eye syndrome of bilateral lacrimal glands: Secondary | ICD-10-CM | POA: Diagnosis not present

## 2021-06-13 DIAGNOSIS — H40013 Open angle with borderline findings, low risk, bilateral: Secondary | ICD-10-CM | POA: Diagnosis not present

## 2021-06-13 DIAGNOSIS — Z79899 Other long term (current) drug therapy: Secondary | ICD-10-CM | POA: Diagnosis not present

## 2021-06-13 DIAGNOSIS — M340 Progressive systemic sclerosis: Secondary | ICD-10-CM | POA: Diagnosis not present

## 2021-06-13 DIAGNOSIS — H5213 Myopia, bilateral: Secondary | ICD-10-CM | POA: Diagnosis not present

## 2021-08-29 DIAGNOSIS — R7303 Prediabetes: Secondary | ICD-10-CM | POA: Diagnosis not present

## 2021-08-29 DIAGNOSIS — Z13220 Encounter for screening for lipoid disorders: Secondary | ICD-10-CM | POA: Diagnosis not present

## 2021-08-29 DIAGNOSIS — Z136 Encounter for screening for cardiovascular disorders: Secondary | ICD-10-CM | POA: Diagnosis not present

## 2021-08-29 DIAGNOSIS — R059 Cough, unspecified: Secondary | ICD-10-CM | POA: Diagnosis not present

## 2021-08-29 DIAGNOSIS — Z20822 Contact with and (suspected) exposure to covid-19: Secondary | ICD-10-CM | POA: Diagnosis not present

## 2021-08-29 DIAGNOSIS — D509 Iron deficiency anemia, unspecified: Secondary | ICD-10-CM | POA: Diagnosis not present

## 2021-08-29 DIAGNOSIS — E559 Vitamin D deficiency, unspecified: Secondary | ICD-10-CM | POA: Diagnosis not present

## 2021-09-05 DIAGNOSIS — R7303 Prediabetes: Secondary | ICD-10-CM | POA: Diagnosis not present

## 2021-09-05 DIAGNOSIS — E559 Vitamin D deficiency, unspecified: Secondary | ICD-10-CM | POA: Diagnosis not present

## 2021-09-05 DIAGNOSIS — E8889 Other specified metabolic disorders: Secondary | ICD-10-CM | POA: Diagnosis not present

## 2021-09-05 DIAGNOSIS — R5383 Other fatigue: Secondary | ICD-10-CM | POA: Diagnosis not present

## 2021-09-07 DIAGNOSIS — Z23 Encounter for immunization: Secondary | ICD-10-CM | POA: Diagnosis not present

## 2021-09-07 DIAGNOSIS — Z0000 Encounter for general adult medical examination without abnormal findings: Secondary | ICD-10-CM | POA: Diagnosis not present

## 2021-09-08 ENCOUNTER — Other Ambulatory Visit: Payer: Self-pay | Admitting: *Deleted

## 2021-09-08 MED ORDER — ALBUTEROL SULFATE HFA 108 (90 BASE) MCG/ACT IN AERS: 2.0000 | INHALATION_SPRAY | Freq: Four times a day (QID) | RESPIRATORY_TRACT | 2 refills | Status: AC | PRN

## 2021-09-08 MED ORDER — ALBUTEROL SULFATE HFA 108 (90 BASE) MCG/ACT IN AERS: 2.0000 | INHALATION_SPRAY | RESPIRATORY_TRACT | 1 refills | Status: DC | PRN

## 2021-09-12 ENCOUNTER — Other Ambulatory Visit: Payer: Self-pay | Admitting: Obstetrics and Gynecology

## 2021-09-12 DIAGNOSIS — R7303 Prediabetes: Secondary | ICD-10-CM | POA: Diagnosis not present

## 2021-09-12 DIAGNOSIS — I700 Atherosclerosis of aorta: Secondary | ICD-10-CM | POA: Diagnosis not present

## 2021-09-12 DIAGNOSIS — J4530 Mild persistent asthma, uncomplicated: Secondary | ICD-10-CM | POA: Diagnosis not present

## 2021-09-12 DIAGNOSIS — Z1231 Encounter for screening mammogram for malignant neoplasm of breast: Secondary | ICD-10-CM

## 2021-09-12 DIAGNOSIS — M349 Systemic sclerosis, unspecified: Secondary | ICD-10-CM | POA: Diagnosis not present

## 2021-09-19 DIAGNOSIS — I5089 Other heart failure: Secondary | ICD-10-CM | POA: Diagnosis not present

## 2021-09-19 DIAGNOSIS — E1169 Type 2 diabetes mellitus with other specified complication: Secondary | ICD-10-CM | POA: Diagnosis not present

## 2021-09-19 DIAGNOSIS — D508 Other iron deficiency anemias: Secondary | ICD-10-CM | POA: Diagnosis not present

## 2021-09-19 DIAGNOSIS — J849 Interstitial pulmonary disease, unspecified: Secondary | ICD-10-CM | POA: Diagnosis not present

## 2021-09-19 DIAGNOSIS — M3489 Other systemic sclerosis: Secondary | ICD-10-CM | POA: Diagnosis not present

## 2021-09-19 DIAGNOSIS — I2721 Secondary pulmonary arterial hypertension: Secondary | ICD-10-CM | POA: Diagnosis not present

## 2021-09-19 DIAGNOSIS — Z79899 Other long term (current) drug therapy: Secondary | ICD-10-CM | POA: Diagnosis not present

## 2021-09-20 DIAGNOSIS — G479 Sleep disorder, unspecified: Secondary | ICD-10-CM | POA: Diagnosis not present

## 2021-09-20 DIAGNOSIS — I7300 Raynaud's syndrome without gangrene: Secondary | ICD-10-CM | POA: Diagnosis not present

## 2021-09-29 DIAGNOSIS — M542 Cervicalgia: Secondary | ICD-10-CM | POA: Diagnosis not present

## 2021-09-29 DIAGNOSIS — Z20822 Contact with and (suspected) exposure to covid-19: Secondary | ICD-10-CM | POA: Diagnosis not present

## 2021-09-29 DIAGNOSIS — R490 Dysphonia: Secondary | ICD-10-CM | POA: Diagnosis not present

## 2021-10-16 ENCOUNTER — Ambulatory Visit: Payer: Medicare Other

## 2021-10-24 DIAGNOSIS — G479 Sleep disorder, unspecified: Secondary | ICD-10-CM | POA: Diagnosis not present

## 2021-10-24 DIAGNOSIS — R490 Dysphonia: Secondary | ICD-10-CM | POA: Diagnosis not present

## 2021-10-24 DIAGNOSIS — K219 Gastro-esophageal reflux disease without esophagitis: Secondary | ICD-10-CM | POA: Diagnosis not present

## 2021-11-10 ENCOUNTER — Ambulatory Visit: Payer: Medicare Other

## 2021-11-14 DIAGNOSIS — B3731 Acute candidiasis of vulva and vagina: Secondary | ICD-10-CM | POA: Diagnosis not present

## 2021-11-20 DIAGNOSIS — L219 Seborrheic dermatitis, unspecified: Secondary | ICD-10-CM | POA: Diagnosis not present

## 2021-11-20 DIAGNOSIS — M349 Systemic sclerosis, unspecified: Secondary | ICD-10-CM | POA: Diagnosis not present

## 2021-11-20 DIAGNOSIS — L668 Other cicatricial alopecia: Secondary | ICD-10-CM | POA: Diagnosis not present

## 2021-11-20 DIAGNOSIS — Z79624 Long term (current) use of inhibitors of nucleotide synthesis: Secondary | ICD-10-CM | POA: Diagnosis not present

## 2021-11-20 DIAGNOSIS — I7300 Raynaud's syndrome without gangrene: Secondary | ICD-10-CM | POA: Diagnosis not present

## 2021-11-20 DIAGNOSIS — L659 Nonscarring hair loss, unspecified: Secondary | ICD-10-CM | POA: Diagnosis not present

## 2021-12-11 DIAGNOSIS — R490 Dysphonia: Secondary | ICD-10-CM | POA: Diagnosis not present

## 2021-12-11 DIAGNOSIS — Z9189 Other specified personal risk factors, not elsewhere classified: Secondary | ICD-10-CM | POA: Diagnosis not present

## 2021-12-11 DIAGNOSIS — R519 Headache, unspecified: Secondary | ICD-10-CM | POA: Diagnosis not present

## 2021-12-11 DIAGNOSIS — G479 Sleep disorder, unspecified: Secondary | ICD-10-CM | POA: Diagnosis not present

## 2021-12-11 DIAGNOSIS — K219 Gastro-esophageal reflux disease without esophagitis: Secondary | ICD-10-CM | POA: Diagnosis not present

## 2021-12-12 DIAGNOSIS — Z79899 Other long term (current) drug therapy: Secondary | ICD-10-CM | POA: Diagnosis not present

## 2021-12-12 DIAGNOSIS — M340 Progressive systemic sclerosis: Secondary | ICD-10-CM | POA: Diagnosis not present

## 2021-12-12 DIAGNOSIS — H04123 Dry eye syndrome of bilateral lacrimal glands: Secondary | ICD-10-CM | POA: Diagnosis not present

## 2021-12-12 DIAGNOSIS — H40013 Open angle with borderline findings, low risk, bilateral: Secondary | ICD-10-CM | POA: Diagnosis not present

## 2021-12-29 ENCOUNTER — Ambulatory Visit
Admission: RE | Admit: 2021-12-29 | Discharge: 2021-12-29 | Disposition: A | Discharge status: Home / Self Care | Payer: Medicare Other | Source: Ambulatory Visit | Attending: Obstetrics and Gynecology | Admitting: Obstetrics and Gynecology

## 2021-12-29 DIAGNOSIS — Z1231 Encounter for screening mammogram for malignant neoplasm of breast: Secondary | ICD-10-CM

## 2022-01-01 ENCOUNTER — Other Ambulatory Visit: Payer: Self-pay | Admitting: Obstetrics and Gynecology

## 2022-01-01 DIAGNOSIS — Z872 Personal history of diseases of the skin and subcutaneous tissue: Secondary | ICD-10-CM

## 2022-01-04 DIAGNOSIS — L98491 Non-pressure chronic ulcer of skin of other sites limited to breakdown of skin: Secondary | ICD-10-CM | POA: Diagnosis not present

## 2022-01-04 DIAGNOSIS — M349 Systemic sclerosis, unspecified: Secondary | ICD-10-CM | POA: Diagnosis not present

## 2022-01-04 DIAGNOSIS — I700 Atherosclerosis of aorta: Secondary | ICD-10-CM | POA: Diagnosis not present

## 2022-01-18 ENCOUNTER — Ambulatory Visit: Admission: RE | Admit: 2022-01-18 | Payer: Medicare Other | Source: Ambulatory Visit

## 2022-01-18 ENCOUNTER — Ambulatory Visit
Admission: RE | Admit: 2022-01-18 | Discharge: 2022-01-18 | Disposition: A | Discharge status: Home / Self Care | Payer: Medicare Other | Source: Ambulatory Visit | Attending: Obstetrics and Gynecology | Admitting: Obstetrics and Gynecology

## 2022-01-18 DIAGNOSIS — Z872 Personal history of diseases of the skin and subcutaneous tissue: Secondary | ICD-10-CM

## 2022-01-18 DIAGNOSIS — R928 Other abnormal and inconclusive findings on diagnostic imaging of breast: Secondary | ICD-10-CM | POA: Diagnosis not present

## 2022-01-30 ENCOUNTER — Encounter (HOSPITAL_BASED_OUTPATIENT_CLINIC_OR_DEPARTMENT_OTHER): Payer: Medicare Other | Admitting: General Surgery

## 2022-02-21 ENCOUNTER — Ambulatory Visit (HOSPITAL_BASED_OUTPATIENT_CLINIC_OR_DEPARTMENT_OTHER): Payer: Medicare Other | Admitting: General Surgery

## 2022-03-02 ENCOUNTER — Other Ambulatory Visit: Payer: Self-pay | Admitting: Otolaryngology

## 2022-03-02 DIAGNOSIS — E041 Nontoxic single thyroid nodule: Secondary | ICD-10-CM

## 2022-03-22 ENCOUNTER — Other Ambulatory Visit (HOSPITAL_COMMUNITY): Payer: Self-pay | Admitting: Internal Medicine

## 2022-03-22 ENCOUNTER — Ambulatory Visit
Admission: RE | Admit: 2022-03-22 | Discharge: 2022-03-22 | Disposition: A | Discharge status: Home / Self Care | Payer: Medicare Other | Source: Ambulatory Visit | Attending: Otolaryngology | Admitting: Otolaryngology

## 2022-03-22 DIAGNOSIS — I2721 Secondary pulmonary arterial hypertension: Secondary | ICD-10-CM

## 2022-03-22 DIAGNOSIS — E041 Nontoxic single thyroid nodule: Secondary | ICD-10-CM

## 2022-03-23 ENCOUNTER — Other Ambulatory Visit: Payer: Self-pay | Admitting: Internal Medicine

## 2022-03-23 DIAGNOSIS — M868X4 Other osteomyelitis, hand: Secondary | ICD-10-CM

## 2022-04-05 ENCOUNTER — Ambulatory Visit (HOSPITAL_COMMUNITY)
Admission: RE | Admit: 2022-04-05 | Discharge: 2022-04-05 | Disposition: A | Discharge status: Home / Self Care | Payer: Medicare Other | Source: Ambulatory Visit | Attending: Internal Medicine | Admitting: Internal Medicine

## 2022-04-05 DIAGNOSIS — I2721 Secondary pulmonary arterial hypertension: Secondary | ICD-10-CM | POA: Insufficient documentation

## 2022-04-05 DIAGNOSIS — I080 Rheumatic disorders of both mitral and aortic valves: Secondary | ICD-10-CM | POA: Diagnosis not present

## 2022-04-05 DIAGNOSIS — Z79899 Other long term (current) drug therapy: Secondary | ICD-10-CM | POA: Diagnosis not present

## 2022-04-05 LAB — ECHOCARDIOGRAM COMPLETE
Area-P 1/2: 3.6 cm2
Calc EF: 62.9 %
P 1/2 time: 414 msec
S' Lateral: 3.7 cm
Single Plane A2C EF: 64.6 %
Single Plane A4C EF: 65 %

## 2022-04-05 NOTE — Progress Notes (Signed)
Echocardiogram 2D Echocardiogram has been performed.  Laura Rowland 04/05/2022, 10:08 AM

## 2022-04-14 ENCOUNTER — Ambulatory Visit
Admission: RE | Admit: 2022-04-14 | Discharge: 2022-04-14 | Disposition: A | Discharge status: Home / Self Care | Payer: Medicare Other | Source: Ambulatory Visit | Attending: Internal Medicine | Admitting: Internal Medicine

## 2022-04-14 DIAGNOSIS — M868X4 Other osteomyelitis, hand: Secondary | ICD-10-CM

## 2022-05-24 DIAGNOSIS — M349 Systemic sclerosis, unspecified: Secondary | ICD-10-CM | POA: Diagnosis not present

## 2022-05-24 DIAGNOSIS — L739 Follicular disorder, unspecified: Secondary | ICD-10-CM | POA: Diagnosis not present

## 2022-05-24 DIAGNOSIS — L219 Seborrheic dermatitis, unspecified: Secondary | ICD-10-CM | POA: Diagnosis not present

## 2022-05-24 DIAGNOSIS — L668 Other cicatricial alopecia: Secondary | ICD-10-CM | POA: Diagnosis not present

## 2022-06-13 DIAGNOSIS — H40013 Open angle with borderline findings, low risk, bilateral: Secondary | ICD-10-CM | POA: Diagnosis not present

## 2022-06-13 DIAGNOSIS — M340 Progressive systemic sclerosis: Secondary | ICD-10-CM | POA: Diagnosis not present

## 2022-06-13 DIAGNOSIS — Z79899 Other long term (current) drug therapy: Secondary | ICD-10-CM | POA: Diagnosis not present

## 2022-06-13 DIAGNOSIS — H04123 Dry eye syndrome of bilateral lacrimal glands: Secondary | ICD-10-CM | POA: Diagnosis not present

## 2022-06-13 DIAGNOSIS — H524 Presbyopia: Secondary | ICD-10-CM | POA: Diagnosis not present

## 2022-09-12 DIAGNOSIS — Z0000 Encounter for general adult medical examination without abnormal findings: Secondary | ICD-10-CM | POA: Diagnosis not present

## 2022-09-19 DIAGNOSIS — I272 Pulmonary hypertension, unspecified: Secondary | ICD-10-CM | POA: Diagnosis not present

## 2022-09-19 DIAGNOSIS — D84821 Immunodeficiency due to drugs: Secondary | ICD-10-CM | POA: Diagnosis not present

## 2022-09-19 DIAGNOSIS — R7303 Prediabetes: Secondary | ICD-10-CM | POA: Diagnosis not present

## 2022-09-19 DIAGNOSIS — I700 Atherosclerosis of aorta: Secondary | ICD-10-CM | POA: Diagnosis not present

## 2022-09-19 DIAGNOSIS — M349 Systemic sclerosis, unspecified: Secondary | ICD-10-CM | POA: Diagnosis not present

## 2022-09-19 DIAGNOSIS — J8410 Pulmonary fibrosis, unspecified: Secondary | ICD-10-CM | POA: Diagnosis not present

## 2022-09-20 DIAGNOSIS — J8489 Other specified interstitial pulmonary diseases: Secondary | ICD-10-CM | POA: Diagnosis not present

## 2022-09-20 DIAGNOSIS — I2721 Secondary pulmonary arterial hypertension: Secondary | ICD-10-CM | POA: Diagnosis not present

## 2022-09-20 DIAGNOSIS — M3489 Other systemic sclerosis: Secondary | ICD-10-CM | POA: Diagnosis not present

## 2022-09-20 DIAGNOSIS — I5089 Other heart failure: Secondary | ICD-10-CM | POA: Diagnosis not present

## 2022-09-20 DIAGNOSIS — Z79899 Other long term (current) drug therapy: Secondary | ICD-10-CM | POA: Diagnosis not present

## 2022-09-27 ENCOUNTER — Other Ambulatory Visit (HOSPITAL_COMMUNITY): Payer: Self-pay | Admitting: Radiology

## 2022-09-27 DIAGNOSIS — R0602 Shortness of breath: Secondary | ICD-10-CM

## 2022-10-01 ENCOUNTER — Ambulatory Visit (HOSPITAL_COMMUNITY)
Admission: RE | Admit: 2022-10-01 | Discharge: 2022-10-01 | Disposition: A | Discharge status: Home / Self Care | Payer: Medicare Other | Source: Ambulatory Visit | Attending: Internal Medicine | Admitting: Internal Medicine

## 2022-10-01 DIAGNOSIS — R0602 Shortness of breath: Secondary | ICD-10-CM | POA: Diagnosis not present

## 2022-10-01 DIAGNOSIS — J45901 Unspecified asthma with (acute) exacerbation: Secondary | ICD-10-CM | POA: Diagnosis not present

## 2022-10-01 DIAGNOSIS — J8410 Pulmonary fibrosis, unspecified: Secondary | ICD-10-CM | POA: Diagnosis not present

## 2022-10-01 DIAGNOSIS — U071 COVID-19: Secondary | ICD-10-CM | POA: Diagnosis not present

## 2022-10-01 LAB — PULMONARY FUNCTION TEST
DL/VA % pred: 78 %
DL/VA: 3.24 ml/min/mmHg/L
DLCO unc % pred: 48 %
DLCO unc: 13.4 ml/min/mmHg
FEF 25-75 Pre: 1.21 L/s
FEF2575-%Pred-Pre: 35 %
FEV1-%Pred-Pre: 50 %
FEV1-Pre: 1.9 L
FEV1FVC-%Pred-Pre: 86 %
FEV6-%Pred-Pre: 59 %
FEV6-Pre: 2.7 L
FEV6FVC-%Pred-Pre: 102 %
FVC-%Pred-Pre: 57 %
FVC-Pre: 2.7 L
Pre FEV1/FVC ratio: 70 %
Pre FEV6/FVC Ratio: 100 %
RV % pred: 87 %
RV: 1.82 L
TLC % pred: 75 %
TLC: 4.73 L

## 2022-10-22 DIAGNOSIS — K648 Other hemorrhoids: Secondary | ICD-10-CM | POA: Diagnosis not present

## 2022-10-22 DIAGNOSIS — K5730 Diverticulosis of large intestine without perforation or abscess without bleeding: Secondary | ICD-10-CM | POA: Diagnosis not present

## 2022-10-22 DIAGNOSIS — Z1211 Encounter for screening for malignant neoplasm of colon: Secondary | ICD-10-CM | POA: Diagnosis not present

## 2022-11-11 ENCOUNTER — Other Ambulatory Visit: Payer: Self-pay | Admitting: Internal Medicine

## 2022-12-17 DIAGNOSIS — M340 Progressive systemic sclerosis: Secondary | ICD-10-CM | POA: Diagnosis not present

## 2022-12-17 DIAGNOSIS — Z79899 Other long term (current) drug therapy: Secondary | ICD-10-CM | POA: Diagnosis not present

## 2022-12-17 DIAGNOSIS — H524 Presbyopia: Secondary | ICD-10-CM | POA: Diagnosis not present

## 2022-12-17 DIAGNOSIS — H40013 Open angle with borderline findings, low risk, bilateral: Secondary | ICD-10-CM | POA: Diagnosis not present

## 2022-12-18 ENCOUNTER — Other Ambulatory Visit: Payer: Self-pay | Admitting: Obstetrics and Gynecology

## 2022-12-18 DIAGNOSIS — Z1231 Encounter for screening mammogram for malignant neoplasm of breast: Secondary | ICD-10-CM

## 2023-01-01 DIAGNOSIS — R509 Fever, unspecified: Secondary | ICD-10-CM | POA: Diagnosis not present

## 2023-01-01 DIAGNOSIS — Z03818 Encounter for observation for suspected exposure to other biological agents ruled out: Secondary | ICD-10-CM | POA: Diagnosis not present

## 2023-01-01 DIAGNOSIS — J069 Acute upper respiratory infection, unspecified: Secondary | ICD-10-CM | POA: Diagnosis not present

## 2023-01-01 DIAGNOSIS — M349 Systemic sclerosis, unspecified: Secondary | ICD-10-CM | POA: Diagnosis not present

## 2023-01-01 DIAGNOSIS — R051 Acute cough: Secondary | ICD-10-CM | POA: Diagnosis not present

## 2023-01-21 ENCOUNTER — Ambulatory Visit
Admission: RE | Admit: 2023-01-21 | Discharge: 2023-01-21 | Disposition: A | Discharge status: Home / Self Care | Payer: Medicare Other | Source: Ambulatory Visit | Attending: Obstetrics and Gynecology | Admitting: Obstetrics and Gynecology

## 2023-01-21 DIAGNOSIS — Z1231 Encounter for screening mammogram for malignant neoplasm of breast: Secondary | ICD-10-CM

## 2023-01-22 DIAGNOSIS — R053 Chronic cough: Secondary | ICD-10-CM | POA: Diagnosis not present

## 2023-02-08 DIAGNOSIS — J849 Interstitial pulmonary disease, unspecified: Secondary | ICD-10-CM | POA: Diagnosis not present

## 2023-02-08 DIAGNOSIS — R053 Chronic cough: Secondary | ICD-10-CM | POA: Diagnosis not present

## 2023-02-08 DIAGNOSIS — R519 Headache, unspecified: Secondary | ICD-10-CM | POA: Diagnosis not present

## 2023-02-08 DIAGNOSIS — R059 Cough, unspecified: Secondary | ICD-10-CM | POA: Diagnosis not present

## 2023-02-08 DIAGNOSIS — R062 Wheezing: Secondary | ICD-10-CM | POA: Diagnosis not present

## 2023-03-19 DIAGNOSIS — M349 Systemic sclerosis, unspecified: Secondary | ICD-10-CM | POA: Diagnosis not present

## 2023-03-19 DIAGNOSIS — Z23 Encounter for immunization: Secondary | ICD-10-CM | POA: Diagnosis not present

## 2023-03-20 DIAGNOSIS — I270 Primary pulmonary hypertension: Secondary | ICD-10-CM | POA: Diagnosis not present

## 2023-03-20 DIAGNOSIS — Z79899 Other long term (current) drug therapy: Secondary | ICD-10-CM | POA: Diagnosis not present

## 2023-03-20 DIAGNOSIS — J849 Interstitial pulmonary disease, unspecified: Secondary | ICD-10-CM | POA: Diagnosis not present

## 2023-03-20 DIAGNOSIS — I5089 Other heart failure: Secondary | ICD-10-CM | POA: Diagnosis not present

## 2023-03-20 DIAGNOSIS — I2721 Secondary pulmonary arterial hypertension: Secondary | ICD-10-CM | POA: Diagnosis not present

## 2023-03-20 DIAGNOSIS — M3489 Other systemic sclerosis: Secondary | ICD-10-CM | POA: Diagnosis not present

## 2023-03-22 ENCOUNTER — Other Ambulatory Visit (HOSPITAL_COMMUNITY): Payer: Self-pay | Admitting: Internal Medicine

## 2023-03-22 ENCOUNTER — Encounter (HOSPITAL_COMMUNITY): Payer: Self-pay | Admitting: Internal Medicine

## 2023-03-22 DIAGNOSIS — I2721 Secondary pulmonary arterial hypertension: Secondary | ICD-10-CM

## 2023-03-28 ENCOUNTER — Ambulatory Visit (HOSPITAL_COMMUNITY)
Admission: RE | Admit: 2023-03-28 | Discharge: 2023-03-28 | Disposition: A | Discharge status: Home / Self Care | Payer: Medicare Other | Source: Ambulatory Visit | Attending: Internal Medicine | Admitting: Internal Medicine

## 2023-03-28 DIAGNOSIS — I2721 Secondary pulmonary arterial hypertension: Secondary | ICD-10-CM | POA: Diagnosis not present

## 2023-03-28 LAB — ECHOCARDIOGRAM COMPLETE
AR max vel: 2.99 cm2
AV Area VTI: 2.76 cm2
AV Area mean vel: 2.86 cm2
AV Mean grad: 6 mmHg
AV Peak grad: 9.1 mmHg
Ao pk vel: 1.51 m/s
Area-P 1/2: 3.13 cm2
S' Lateral: 2.8 cm

## 2023-06-06 DIAGNOSIS — G479 Sleep disorder, unspecified: Secondary | ICD-10-CM | POA: Diagnosis not present

## 2023-06-06 DIAGNOSIS — I700 Atherosclerosis of aorta: Secondary | ICD-10-CM | POA: Diagnosis not present

## 2023-06-20 DIAGNOSIS — I700 Atherosclerosis of aorta: Secondary | ICD-10-CM | POA: Diagnosis not present

## 2023-06-20 DIAGNOSIS — G479 Sleep disorder, unspecified: Secondary | ICD-10-CM | POA: Diagnosis not present

## 2023-07-03 DIAGNOSIS — H524 Presbyopia: Secondary | ICD-10-CM | POA: Diagnosis not present

## 2023-07-03 DIAGNOSIS — Z79899 Other long term (current) drug therapy: Secondary | ICD-10-CM | POA: Diagnosis not present

## 2023-07-03 DIAGNOSIS — H40013 Open angle with borderline findings, low risk, bilateral: Secondary | ICD-10-CM | POA: Diagnosis not present

## 2023-07-03 DIAGNOSIS — M340 Progressive systemic sclerosis: Secondary | ICD-10-CM | POA: Diagnosis not present

## 2023-07-04 DIAGNOSIS — I700 Atherosclerosis of aorta: Secondary | ICD-10-CM | POA: Diagnosis not present

## 2023-07-04 DIAGNOSIS — G479 Sleep disorder, unspecified: Secondary | ICD-10-CM | POA: Diagnosis not present

## 2023-07-22 DIAGNOSIS — I700 Atherosclerosis of aorta: Secondary | ICD-10-CM | POA: Diagnosis not present

## 2023-07-22 DIAGNOSIS — J45901 Unspecified asthma with (acute) exacerbation: Secondary | ICD-10-CM | POA: Diagnosis not present

## 2023-07-22 DIAGNOSIS — J4530 Mild persistent asthma, uncomplicated: Secondary | ICD-10-CM | POA: Diagnosis not present

## 2023-07-22 DIAGNOSIS — G479 Sleep disorder, unspecified: Secondary | ICD-10-CM | POA: Diagnosis not present

## 2023-08-08 DIAGNOSIS — G479 Sleep disorder, unspecified: Secondary | ICD-10-CM | POA: Diagnosis not present

## 2023-08-08 DIAGNOSIS — I700 Atherosclerosis of aorta: Secondary | ICD-10-CM | POA: Diagnosis not present

## 2023-08-22 DIAGNOSIS — J4530 Mild persistent asthma, uncomplicated: Secondary | ICD-10-CM | POA: Diagnosis not present

## 2023-08-22 DIAGNOSIS — G479 Sleep disorder, unspecified: Secondary | ICD-10-CM | POA: Diagnosis not present

## 2023-08-22 DIAGNOSIS — I700 Atherosclerosis of aorta: Secondary | ICD-10-CM | POA: Diagnosis not present

## 2023-08-22 DIAGNOSIS — J45901 Unspecified asthma with (acute) exacerbation: Secondary | ICD-10-CM | POA: Diagnosis not present

## 2023-09-05 DIAGNOSIS — I700 Atherosclerosis of aorta: Secondary | ICD-10-CM | POA: Diagnosis not present

## 2023-09-05 DIAGNOSIS — G479 Sleep disorder, unspecified: Secondary | ICD-10-CM | POA: Diagnosis not present

## 2023-09-16 DIAGNOSIS — Z0000 Encounter for general adult medical examination without abnormal findings: Secondary | ICD-10-CM | POA: Diagnosis not present

## 2023-09-17 DIAGNOSIS — J849 Interstitial pulmonary disease, unspecified: Secondary | ICD-10-CM | POA: Diagnosis not present

## 2023-09-17 DIAGNOSIS — I2721 Secondary pulmonary arterial hypertension: Secondary | ICD-10-CM | POA: Diagnosis not present

## 2023-09-17 DIAGNOSIS — I270 Primary pulmonary hypertension: Secondary | ICD-10-CM | POA: Diagnosis not present

## 2023-09-17 DIAGNOSIS — I5089 Other heart failure: Secondary | ICD-10-CM | POA: Diagnosis not present

## 2023-09-17 DIAGNOSIS — Z79899 Other long term (current) drug therapy: Secondary | ICD-10-CM | POA: Diagnosis not present

## 2023-09-17 DIAGNOSIS — M3489 Other systemic sclerosis: Secondary | ICD-10-CM | POA: Diagnosis not present

## 2023-09-22 DIAGNOSIS — J45901 Unspecified asthma with (acute) exacerbation: Secondary | ICD-10-CM | POA: Diagnosis not present

## 2023-09-22 DIAGNOSIS — J4530 Mild persistent asthma, uncomplicated: Secondary | ICD-10-CM | POA: Diagnosis not present

## 2023-09-26 DIAGNOSIS — I700 Atherosclerosis of aorta: Secondary | ICD-10-CM | POA: Diagnosis not present

## 2023-09-26 DIAGNOSIS — G479 Sleep disorder, unspecified: Secondary | ICD-10-CM | POA: Diagnosis not present

## 2023-09-26 DIAGNOSIS — R7303 Prediabetes: Secondary | ICD-10-CM | POA: Diagnosis not present

## 2023-10-08 ENCOUNTER — Emergency Department (HOSPITAL_COMMUNITY)

## 2023-10-08 ENCOUNTER — Other Ambulatory Visit: Payer: Self-pay

## 2023-10-08 ENCOUNTER — Observation Stay (HOSPITAL_COMMUNITY)
Admission: EM | Admit: 2023-10-08 | Discharge: 2023-10-10 | Disposition: A | Discharge status: Home / Self Care | Source: Ambulatory Visit | Attending: Family Medicine | Admitting: Family Medicine

## 2023-10-08 ENCOUNTER — Encounter (HOSPITAL_COMMUNITY): Payer: Self-pay

## 2023-10-08 DIAGNOSIS — B9789 Other viral agents as the cause of diseases classified elsewhere: Secondary | ICD-10-CM | POA: Insufficient documentation

## 2023-10-08 DIAGNOSIS — I2721 Secondary pulmonary arterial hypertension: Secondary | ICD-10-CM | POA: Diagnosis not present

## 2023-10-08 DIAGNOSIS — B348 Other viral infections of unspecified site: Secondary | ICD-10-CM | POA: Insufficient documentation

## 2023-10-08 DIAGNOSIS — I270 Primary pulmonary hypertension: Secondary | ICD-10-CM | POA: Insufficient documentation

## 2023-10-08 DIAGNOSIS — R0600 Dyspnea, unspecified: Secondary | ICD-10-CM

## 2023-10-08 DIAGNOSIS — R918 Other nonspecific abnormal finding of lung field: Secondary | ICD-10-CM | POA: Diagnosis not present

## 2023-10-08 DIAGNOSIS — M3481 Systemic sclerosis with lung involvement: Secondary | ICD-10-CM | POA: Insufficient documentation

## 2023-10-08 DIAGNOSIS — J40 Bronchitis, not specified as acute or chronic: Principal | ICD-10-CM | POA: Insufficient documentation

## 2023-10-08 DIAGNOSIS — Z7951 Long term (current) use of inhaled steroids: Secondary | ICD-10-CM | POA: Diagnosis not present

## 2023-10-08 DIAGNOSIS — J849 Interstitial pulmonary disease, unspecified: Secondary | ICD-10-CM | POA: Diagnosis not present

## 2023-10-08 DIAGNOSIS — J189 Pneumonia, unspecified organism: Principal | ICD-10-CM

## 2023-10-08 DIAGNOSIS — R0602 Shortness of breath: Secondary | ICD-10-CM | POA: Diagnosis not present

## 2023-10-08 DIAGNOSIS — R9389 Abnormal findings on diagnostic imaging of other specified body structures: Secondary | ICD-10-CM | POA: Diagnosis not present

## 2023-10-08 DIAGNOSIS — J8410 Pulmonary fibrosis, unspecified: Secondary | ICD-10-CM | POA: Diagnosis not present

## 2023-10-08 DIAGNOSIS — M349 Systemic sclerosis, unspecified: Secondary | ICD-10-CM | POA: Diagnosis present

## 2023-10-08 DIAGNOSIS — R059 Cough, unspecified: Secondary | ICD-10-CM | POA: Insufficient documentation

## 2023-10-08 DIAGNOSIS — Z79899 Other long term (current) drug therapy: Secondary | ICD-10-CM | POA: Diagnosis not present

## 2023-10-08 DIAGNOSIS — R062 Wheezing: Secondary | ICD-10-CM | POA: Diagnosis present

## 2023-10-08 DIAGNOSIS — I509 Heart failure, unspecified: Secondary | ICD-10-CM | POA: Insufficient documentation

## 2023-10-08 LAB — TROPONIN I (HIGH SENSITIVITY)
Troponin I (High Sensitivity): 5 ng/L (ref <18)
Troponin I (High Sensitivity): 5 ng/L (ref <18)

## 2023-10-08 LAB — PROCALCITONIN: Procalcitonin: <0.1 ng/mL

## 2023-10-08 LAB — RESP PANEL BY RT-PCR (RSV, FLU A&B, COVID)  RVPGX2
Influenza A by PCR: NEGATIVE
Influenza B by PCR: NEGATIVE
Resp Syncytial Virus by PCR: NEGATIVE
SARS Coronavirus 2 by RT PCR: NEGATIVE

## 2023-10-08 LAB — URINALYSIS, ROUTINE W REFLEX MICROSCOPIC
Bilirubin Urine: NEGATIVE
Glucose, UA: NEGATIVE mg/dL
Hgb urine dipstick: NEGATIVE
Ketones, ur: NEGATIVE mg/dL
Nitrite: NEGATIVE
Protein, ur: NEGATIVE mg/dL
Specific Gravity, Urine: 1.015 (ref 1.005–1.030)
pH: 5 (ref 5.0–8.0)

## 2023-10-08 LAB — COMPREHENSIVE METABOLIC PANEL WITH GFR
ALT: 13 U/L (ref 0–44)
AST: 22 U/L (ref 15–41)
Albumin: 3.4 g/dL — ABNORMAL LOW (ref 3.5–5.0)
Alkaline Phosphatase: 52 U/L (ref 38–126)
Anion gap: 8 (ref 5–15)
BUN: 8 mg/dL (ref 6–20)
CO2: 23 mmol/L (ref 22–32)
Calcium: 8.7 mg/dL — ABNORMAL LOW (ref 8.9–10.3)
Chloride: 106 mmol/L (ref 98–111)
Creatinine, Ser: 0.78 mg/dL (ref 0.44–1.00)
GFR, Estimated: >60 mL/min (ref >60)
Glucose, Bld: 101 mg/dL — ABNORMAL HIGH (ref 70–99)
Potassium: 4.3 mmol/L (ref 3.5–5.1)
Sodium: 137 mmol/L (ref 135–145)
Total Bilirubin: <0.2 mg/dL (ref 0.0–1.2)
Total Protein: 7.1 g/dL (ref 6.5–8.1)

## 2023-10-08 LAB — CBG MONITORING, ED: Glucose-Capillary: 109 mg/dL — ABNORMAL HIGH (ref 70–99)

## 2023-10-08 LAB — CBC
HCT: 38.6 % (ref 36.0–46.0)
Hemoglobin: 12 g/dL (ref 12.0–15.0)
MCH: 25.5 pg — ABNORMAL LOW (ref 26.0–34.0)
MCHC: 31.1 g/dL (ref 30.0–36.0)
MCV: 82 fL (ref 80.0–100.0)
Platelets: 220 K/uL (ref 150–400)
RBC: 4.71 MIL/uL (ref 3.87–5.11)
RDW: 14.6 % (ref 11.5–15.5)
WBC: 5.2 K/uL (ref 4.0–10.5)
nRBC: 0 % (ref 0.0–0.2)

## 2023-10-08 LAB — C-REACTIVE PROTEIN: CRP: 0.6 mg/dL (ref <1.0)

## 2023-10-08 LAB — CK: Total CK: 147 U/L (ref 38–234)

## 2023-10-08 LAB — HCG, SERUM, QUALITATIVE: Preg, Serum: NEGATIVE

## 2023-10-08 LAB — LACTIC ACID, PLASMA: Lactic Acid, Venous: 0.9 mmol/L (ref 0.5–1.9)

## 2023-10-08 LAB — MAGNESIUM: Magnesium: 2.1 mg/dL (ref 1.7–2.4)

## 2023-10-08 LAB — PHOSPHORUS: Phosphorus: 2 mg/dL — ABNORMAL LOW (ref 2.5–4.6)

## 2023-10-08 MED ORDER — ALBUTEROL SULFATE (2.5 MG/3ML) 0.083% IN NEBU
5.0000 mg | INHALATION_SOLUTION | Freq: Once | RESPIRATORY_TRACT | Status: AC
  Administered 2023-10-08: 5 mg via RESPIRATORY_TRACT
  Filled 2023-10-08: qty 6

## 2023-10-08 MED ORDER — PANTOPRAZOLE SODIUM 40 MG PO TBEC
40.0000 mg | DELAYED_RELEASE_TABLET | Freq: Every day | ORAL | Status: DC
Start: 2023-10-09 — End: 2023-10-10
  Administered 2023-10-09 – 2023-10-10 (×2): 40 mg via ORAL
  Filled 2023-10-08: qty 1
  Filled 2023-10-08: qty 2

## 2023-10-08 MED ORDER — HYDROCODONE-ACETAMINOPHEN 5-325 MG PO TABS
1.0000 | ORAL_TABLET | ORAL | Status: DC | PRN
  Filled 2023-10-08: qty 2

## 2023-10-08 MED ORDER — PENTOXIFYLLINE ER 400 MG PO TBCR
400.0000 mg | EXTENDED_RELEASE_TABLET | Freq: Three times a day (TID) | ORAL | Status: DC
  Administered 2023-10-09 – 2023-10-10 (×4): 400 mg via ORAL
  Filled 2023-10-08 (×6): qty 1

## 2023-10-08 MED ORDER — SODIUM CHLORIDE 0.9% FLUSH: 3.0000 mL | INTRAVENOUS | Status: DC | PRN

## 2023-10-08 MED ORDER — NIFEDIPINE ER OSMOTIC RELEASE 30 MG PO TB24: 30.0000 mg | ORAL_TABLET | Freq: Every day | ORAL | Status: DC

## 2023-10-08 MED ORDER — ONDANSETRON HCL 4 MG PO TABS: 4.0000 mg | ORAL_TABLET | Freq: Four times a day (QID) | ORAL | Status: DC | PRN

## 2023-10-08 MED ORDER — IPRATROPIUM-ALBUTEROL 0.5-2.5 (3) MG/3ML IN SOLN
3.0000 mL | RESPIRATORY_TRACT | Status: DC | PRN
  Administered 2023-10-09: 3 mL via RESPIRATORY_TRACT
  Filled 2023-10-08: qty 3

## 2023-10-08 MED ORDER — SODIUM CHLORIDE 0.9 % IV SOLN: 250.0000 mL | INTRAVENOUS | Status: AC | PRN

## 2023-10-08 MED ORDER — FENTANYL CITRATE PF 50 MCG/ML IJ SOSY: 12.5000 ug | PREFILLED_SYRINGE | INTRAMUSCULAR | Status: DC | PRN

## 2023-10-08 MED ORDER — ALBUTEROL SULFATE (2.5 MG/3ML) 0.083% IN NEBU
2.5000 mg | INHALATION_SOLUTION | RESPIRATORY_TRACT | Status: DC
  Administered 2023-10-08 (×2): 2.5 mg via RESPIRATORY_TRACT
  Filled 2023-10-08 (×2): qty 3

## 2023-10-08 MED ORDER — FLUTICASONE FUROATE-VILANTEROL 200-25 MCG/ACT IN AEPB
1.0000 | INHALATION_SPRAY | Freq: Every day | RESPIRATORY_TRACT | Status: DC
  Administered 2023-10-09 – 2023-10-10 (×2): 1 via RESPIRATORY_TRACT
  Filled 2023-10-08: qty 28

## 2023-10-08 MED ORDER — ACETAMINOPHEN 650 MG RE SUPP: 650.0000 mg | Freq: Four times a day (QID) | RECTAL | Status: DC | PRN

## 2023-10-08 MED ORDER — SODIUM CHLORIDE 0.9 % IV SOLN
500.0000 mg | Freq: Once | INTRAVENOUS | Status: AC
  Administered 2023-10-08: 500 mg via INTRAVENOUS
  Filled 2023-10-08: qty 5

## 2023-10-08 MED ORDER — ONDANSETRON HCL 4 MG/2ML IJ SOLN: 4.0000 mg | Freq: Four times a day (QID) | INTRAMUSCULAR | Status: DC | PRN

## 2023-10-08 MED ORDER — POTASSIUM PHOSPHATES 15 MMOLE/5ML IV SOLN
15.0000 mmol | Freq: Once | INTRAVENOUS | Status: AC
  Administered 2023-10-09: 15 mmol via INTRAVENOUS
  Filled 2023-10-08: qty 5

## 2023-10-08 MED ORDER — ACETAMINOPHEN 325 MG PO TABS: 650.0000 mg | ORAL_TABLET | Freq: Four times a day (QID) | ORAL | Status: DC | PRN

## 2023-10-08 MED ORDER — SODIUM CHLORIDE 0.9 % IV SOLN: 2.0000 g | INTRAVENOUS | Status: DC

## 2023-10-08 MED ORDER — CEFTRIAXONE SODIUM 1 G IJ SOLR
1.0000 g | Freq: Once | INTRAMUSCULAR | Status: AC
  Administered 2023-10-08: 1 g via INTRAVENOUS
  Filled 2023-10-08: qty 10

## 2023-10-08 MED ORDER — SODIUM CHLORIDE 0.9% FLUSH
3.0000 mL | Freq: Two times a day (BID) | INTRAVENOUS | Status: DC
  Administered 2023-10-09 – 2023-10-10 (×3): 3 mL via INTRAVENOUS

## 2023-10-08 MED ORDER — PRAZOSIN HCL 1 MG PO CAPS
1.0000 mg | ORAL_CAPSULE | Freq: Three times a day (TID) | ORAL | Status: DC
  Administered 2023-10-09 – 2023-10-10 (×4): 1 mg via ORAL
  Filled 2023-10-08 (×6): qty 1

## 2023-10-08 MED ORDER — IOHEXOL 350 MG/ML SOLN
75.0000 mL | Freq: Once | INTRAVENOUS | Status: AC | PRN
  Administered 2023-10-08: 75 mL via INTRAVENOUS

## 2023-10-08 MED ORDER — SODIUM CHLORIDE 0.9 % IV SOLN: 500.0000 mg | INTRAVENOUS | Status: DC

## 2023-10-08 MED ORDER — METHYLPREDNISOLONE SODIUM SUCC 125 MG IJ SOLR
125.0000 mg | Freq: Once | INTRAMUSCULAR | Status: AC
  Administered 2023-10-08: 125 mg via INTRAVENOUS
  Filled 2023-10-08: qty 2

## 2023-10-08 NOTE — ED Provider Notes (Signed)
 [place] [facility]MENT AT [facility] Provider Note   CSN: [id number] Arrival date & time: 10/08/23  0945     Patient presents with: Shortness of Breath   Laura Rowland is a 48 y.o. female.   HPI 48 year old female with a history of pulmonary fibrosis, interstitial lung disease, and scleroderma presents with shortness of breath.  Typically has shortness of breath with exertion only but over the last 3 days has been having constant shortness of breath, including at rest.  She has a cough that feels wet to her.  No fevers.  She has chest pain when she coughs but otherwise no chest pain.  No leg swelling.  She had an albuterol  treatment in triage several hours ago and felt transiently better but now is getting worse again.  She is currently on a second treatment.  Prior to Admission medications   Medication Sig Start Date End Date Taking? Authorizing Provider  albuterol  (VENTOLIN  HFA) 108 (90 Base) MCG/ACT inhaler Inhale 2 puffs into the lungs every 6 (six) hours as needed for wheezing or shortness of breath. 09/08/21   Darlean Ozell NOVAK, MD  amLODipine (NORVASC) 10 MG tablet Take 10 mg by mouth daily. 05/20/19   [provider]  budesonide -formoterol  (SYMBICORT ) 160-4.5 MCG/ACT inhaler Inhale 2 puffs into the lungs 2 (two) times daily. 10/03/18   Darlean Ozell NOVAK, MD  Cholecalciferol (VITAMIN D) 2000 UNITS tablet Take 2,000 Units by mouth daily.    [provider]  diclofenac Sodium (VOLTAREN) 1 % GEL Voltaren 1 % topical gel    [provider]  esomeprazole  (NEXIUM ) 40 MG capsule Take 30- 60 min before your first and last meals of the day Patient taking differently: Take 40 mg by mouth 2 (two) times daily before a meal. Take 30- 60 min before your first and last meals of the day 06/22/15   Darlean Ozell NOVAK, MD  hydroxychloroquine  (PLAQUENIL ) 200 MG tablet Take 200 mg by mouth 3 (three) times daily. Pt takes 2 tablets 400mg  in the morning and one tablet  200mg  at night    [provider]  ibuprofen  (ADVIL ,MOTRIN ) 800 MG tablet Take 800 mg by mouth every 8 (eight) hours as needed.    [provider]  ipratropium-albuterol  (DUONEB) 0.5-2.5 (3) MG/3ML SOLN Take 3 mLs by nebulization every 4 (four) hours as needed. 12/05/20   Darlean Ozell NOVAK, MD  mycophenolate  (CELLCEPT ) 500 MG tablet Take 500 mg by mouth 2 (two) times daily. 12/02/19   [provider]  NIFEdipine  (ADALAT  CC) 30 MG 24 hr tablet Take by mouth. 04/23/19 04/22/20  [provider]  pentoxifylline  (TRENTAL ) 400 MG CR tablet Take 400 mg by mouth 3 (three) times daily. 05/20/19   [provider]  prazosin  (MINIPRESS ) 1 MG capsule Take 1 mg by mouth 3 (three) times daily. 05/20/19   [provider]  Prenatal 27-1 MG TABS Take 1 tablet by mouth daily. 05/15/19   [provider]  valACYclovir (VALTREX) 500 MG tablet Take 500 mg by mouth daily.    [provider]    Allergies: Patient has no known allergies.    Review of Systems  Constitutional:  Negative for fever.  Respiratory:  Positive for cough and shortness of breath.   Cardiovascular:  Positive for chest pain. Negative for leg swelling.    Updated Vital Signs BP (!) 144/87 (BP Location: Right Arm)   Pulse 70   Temp 98.1 F (36.7 C) (Oral)   Resp  20   Ht 6' (1.829 m)   Wt 115 kg   SpO2 90%   BMI 34.38 kg/m   Physical Exam Vitals and nursing note reviewed.  Constitutional:      Appearance: She is well-developed. She is not diaphoretic.     Comments: Currently on a breathing treatment.  HENT:     Head: Normocephalic and atraumatic.  Cardiovascular:     Rate and Rhythm: Normal rate and regular rhythm.     Heart sounds: Normal heart sounds.  Pulmonary:     Effort: Pulmonary effort is normal. No accessory muscle usage or respiratory distress.     Breath sounds: Examination of the right-lower field reveals rales. Examination of the left-lower field reveals  rales. Rales present.  Abdominal:     Palpations: Abdomen is soft.     Tenderness: There is no abdominal tenderness.  Skin:    General: Skin is warm and dry.  Neurological:     Mental Status: She is alert.     (all labs ordered are listed, but only abnormal results are displayed) Labs Reviewed  COMPREHENSIVE METABOLIC PANEL WITH GFR - Abnormal; Notable for the following components:      Result Value   Glucose, Bld 101 (*)    Calcium  8.7 (*)    Albumin 3.4 (*)    All other components within normal limits  CBC - Abnormal; Notable for the following components:   MCH 25.5 (*)    All other components within normal limits  URINALYSIS, ROUTINE W REFLEX MICROSCOPIC - Abnormal; Notable for the following components:   APPearance HAZY (*)    Leukocytes,Ua TRACE (*)    Bacteria, UA RARE (*)    All other components within normal limits  CBG MONITORING, ED - Abnormal; Notable for the following components:   Glucose-Capillary 109 (*)    All other components within normal limits  RESP PANEL BY RT-PCR (RSV, FLU A&B, COVID)  RVPGX2  CULTURE, BLOOD (ROUTINE X 2)  CULTURE, BLOOD (ROUTINE X 2)  HCG, SERUM, QUALITATIVE  TROPONIN I (HIGH SENSITIVITY)  TROPONIN I (HIGH SENSITIVITY)    EKG: EKG Interpretation Date/Time:  Tuesday October 08 2023 09:55:00 EDT Ventricular Rate:  91 PR Interval:  146 QRS Duration:  102 QT Interval:  396 QTC Calculation: 487 R Axis:   58  Text Interpretation: Normal sinus rhythm Possible Left atrial enlargement Septal infarct , age undetermined no significant change since 2021 Confirmed by Freddi Hamilton [phone]) on 10/08/2023 4:58:57 PM  Radiology: [facility] Chest 2 View Result Date: 10/08/2023 CLINICAL DATA:  Three day history of shortness of breath EXAM: CHEST - 2 VIEW COMPARISON:  Chest radiograph dated 02/08/2023 FINDINGS: Unchanged asymmetric elevation of the right hemidiaphragm. Normal lung volumes. Increased conspicuity patchy reticular opacities throughout  the right lung and left lower lung. Blunting of right costophrenic angle. No pneumothorax. The heart size and mediastinal contours are within normal limits. No acute osseous abnormality. IMPRESSION: 1. Increased conspicuity of patchy reticular opacities throughout the right lung and left lower lung, which may reflect worsening fibrosis. Superimposed infection/inflammation is not excluded. 2. Blunting of the right costophrenic angle, which may represent a small pleural effusion. Electronically Signed   By: Limin  Xu M.D.   On: 10/08/2023 10:51     Procedures   Medications Ordered in the ED  albuterol  (PROVENTIL ) (2.5 MG/3ML) 0.083% nebulizer solution 2.5 mg (2.5 mg Nebulization Given 10/08/23 1702)  cefTRIAXone  (ROCEPHIN ) 1 g in sodium chloride  0.9 % 100 mL IVPB (1 g  Intravenous New Bag/Given 10/08/23 1823)  azithromycin  (ZITHROMAX ) 500 mg in sodium chloride  0.9 % 250 mL IVPB (has no administration in time range)  albuterol  (PROVENTIL ) (2.5 MG/3ML) 0.083% nebulizer solution 5 mg (5 mg Nebulization Given 10/08/23 1043)  methylPREDNISolone  sodium succinate (SOLU-MEDROL ) 125 mg/2 mL injection 125 mg (125 mg Intravenous Given 10/08/23 1816)                                    Medical Decision Making Amount and/or Complexity of Data Reviewed External Data Reviewed: notes. Labs: ordered.    Details: Normal WBC and troponin Radiology: ordered and independent interpretation performed.    Details: Abnormal findings, mostly chronic but could have some new pneumonia ECG/medicine tests: ordered and independent interpretation performed.    Details: Sinus rhythm  Risk Prescription drug management. Decision regarding hospitalization.   Patient is feeling better after breathing treatments but still short of breath.  Has some borderline O2 sats.  X-ray is concerning that she might have an underlying pneumonia on top of her chronic pulmonary findings.  Clinically this seems to be the case as well.  Will give  IV antibiotics, she is currently immunocompromise taking CellCept .  Will need admission, discussed with Dr. Silvester.  She is requesting a CTA for further clarification and to rule out PE, which has been ordered.     Final diagnoses:  Community acquired pneumonia, unspecified laterality    ED Discharge Orders     None          Freddi Hamilton, MD 10/08/23 [phone]

## 2023-10-08 NOTE — ED Provider Triage Note (Signed)
 Emergency Medicine Provider Triage Evaluation Note  [name] , a 48 y.o. female  was evaluated in triage.  Pt complains of shortness of breath.  Has a history of pulmonary fibrosis and scleroderma uses inhalers at home.  She used her inhaler without improvement.  No associated leg pain or swelling.  No chest pain..  Review of Systems  Positive: Wheezing, shortness of breath Negative: Fevers, congestion  Physical Exam  BP (!) 147/89 (BP Location: Right Arm)   Pulse 91   Temp 97.9 F (36.6 C)   Resp (!) 32   Ht 6' (1.829 m)   Wt 115 kg   SpO2 (!) 64%   BMI 34.38 kg/m  Gen:   Awake, no distress    Resp:  Normal effort   MSK:   Moves extremities without difficulty   Other:     Medical Decision Making  Medically screening exam initiated at 10:23 AM.  Appropriate orders placed.  [name] was informed that the remainder of the evaluation will be completed by another provider, this initial triage assessment does not replace that evaluation, and the importance of remaining in the ED until their evaluation is complete.      Lenor Hollering, MD 10/08/23 1023

## 2023-10-08 NOTE — Assessment & Plan Note (Signed)
 Continue nifedipine  and prazosin  appreciate pulmonology consult

## 2023-10-08 NOTE — ED Triage Notes (Signed)
 Pt denies pain, denies nausea or vomiting. Pt does have dizziness.

## 2023-10-08 NOTE — Assessment & Plan Note (Signed)
 CellCept  and Plaquenil  for tonight until infectious etiology ruled out

## 2023-10-08 NOTE — ED Notes (Signed)
 Unable to get an accurate reading due to pt's previous condition. Tried different methods for spo2 and it did not work.

## 2023-10-08 NOTE — H&P (Signed)
 Laura Rowland FMW:[id number] DOB: [birth year] DOA: 10/08/2023     PCP: Cleotilde Planas, MD    Pulmonology Dr. Darlean Patient arrived to ER on 10/08/23 at 0945 Referred by Attending Freddi Hamilton, MD   Patient coming from:    home Lives  With family     Chief Complaint:   Chief Complaint  Patient presents with   Shortness of Breath    HPI: Laura Rowland is a 48 y.o. female with medical history significant of scleroderma, interstitial lung disease pulmonary hypertension, history of heart failure  Presented with worsening shortness of breath and cough Patient was sent by urgent care for shortness of breath for the past 3 days has been getting progressively worse no chest pain no nausea no vomiting has been having some cough productive of clear sputum patient has underlying scleroderma and pulmonary fibrosis with interstitial lung disease and has some slight shortness of breath at baseline but has been getting worse no associated fevers no leg edema Albuterol  helped her but only for a little bit Patient is immunocompromise she takes CellCept  and Plaquenil      Denies significant ETOH intake   Does not smoke      Regarding pertinent Chronic problems:      HTN on Norvasc  Scleroderma on Plaquenil  CellCept , [place]   Pulmonary hypertension on nifedipine    last echo  Recent Results (from the past 56199 hours)  ECHOCARDIOGRAM COMPLETE   Collection Time: 03/28/23 10:34 AM  Result Value   S' Lateral 2.80   AV Area VTI 2.76   AV Mean grad 6.0   AV Area mean vel 2.86   Area-P 1/2 3.13   AR max vel 2.99   AV Peak grad 9.1   Ao pk vel 1.51   Est EF 60 - 65%   Narrative      ECHOCARDIOGRAM REPORT        IMPRESSIONS    1. Left ventricular ejection fraction, by estimation, is 60 to 65%. The left ventricle has normal function. The left ventricle has no regional wall motion abnormalities. Left ventricular diastolic parameters were normal.  2. Right ventricular systolic  function is normal. The right ventricular size is normal. Tricuspid regurgitation signal is inadequate for assessing PA pressure.  3. The mitral valve is normal in structure. No evidence of mitral valve regurgitation.  4. The aortic valve was not well visualized. Aortic valve regurgitation is not visualized.  5. The inferior vena cava is normal in size with greater than 50% respiratory variability, suggesting right atrial pressure of 3 mmHg.           While in ER:         Lab Orders         Resp panel by RT-PCR (RSV, Flu A&B, Covid) Anterior Nasal Swab         Culture, blood (routine x 2)         Comprehensive metabolic panel         CBC         Urinalysis, Routine w reflex microscopic -Urine, Clean Catch         hCG, serum, qualitative         CBG monitoring, ED     CXR -increased patchy reticular opacities  CTA chest -  no PE, increased nodularity likely progression of scleroderma But no evidence of pneumonia  Following Medications were ordered in ER: Medications  albuterol  (PROVENTIL ) (2.5 MG/3ML) 0.083% nebulizer solution 2.5 mg (2.5 mg  Nebulization Given 10/08/23 1959)  azithromycin  (ZITHROMAX ) 500 mg in sodium chloride  0.9 % 250 mL IVPB (500 mg Intravenous New Bag/Given 10/08/23 1931)  albuterol  (PROVENTIL ) (2.5 MG/3ML) 0.083% nebulizer solution 5 mg (5 mg Nebulization Given 10/08/23 1043)  methylPREDNISolone  sodium succinate (SOLU-MEDROL ) 125 mg/2 mL injection 125 mg (125 mg Intravenous Given 10/08/23 1816)  cefTRIAXone  (ROCEPHIN ) 1 g in sodium chloride  0.9 % 100 mL IVPB (0 g Intravenous Stopped 10/08/23 1929)  iohexol  (OMNIPAQUE ) 350 MG/ML injection 75 mL (75 mLs Intravenous Contrast Given 10/08/23 1927)        ED Triage Vitals  Encounter Vitals Group     BP 10/08/23 0949 (!) 147/89     Girls Systolic BP Percentile --      Girls Diastolic BP Percentile --      Boys Systolic BP Percentile --      Boys Diastolic BP Percentile --      Pulse Rate 10/08/23 0949 91      Resp 10/08/23 0949 17     Temp 10/08/23 0949 97.9 F (36.6 C)     Temp Source 10/08/23 1712 Oral     SpO2 10/08/23 0949 90 %     Weight 10/08/23 0957 253 lb 8.5 oz (115 kg)     Height 10/08/23 0957 6' (1.829 m)     Head Circumference --      Peak Flow --      Pain Score 10/08/23 0957 0     Pain Loc --      Pain Education --      Exclude from Growth Chart --   UFJK(75)@     _________________________________________ Significant initial  Findings: Abnormal Labs Reviewed  COMPREHENSIVE METABOLIC PANEL WITH GFR - Abnormal; Notable for the following components:      Result Value   Glucose, Bld 101 (*)    Calcium  8.7 (*)    Albumin 3.4 (*)    All other components within normal limits  CBC - Abnormal; Notable for the following components:   MCH 25.5 (*)    All other components within normal limits  URINALYSIS, ROUTINE W REFLEX MICROSCOPIC - Abnormal; Notable for the following components:   APPearance HAZY (*)    Leukocytes,Ua TRACE (*)    Bacteria, UA RARE (*)    All other components within normal limits  CBG MONITORING, ED - Abnormal; Notable for the following components:   Glucose-Capillary 109 (*)    All other components within normal limits    _________________________ Troponin  ordered Cardiac Panel (last 3 results) Recent Labs    10/08/23 1124 10/08/23 1729  TROPONINIHS 5 5     ECG: Ordered Personally reviewed and interpreted by me showing: HR : 91 Rhythm: Normal sinus rhythm Possible Left atrial enlargement Septal infarct , age undetermined no significant change since 2021 QTC 487    The recent clinical data is shown below. Vitals:   10/08/23 1548 10/08/23 1712 10/08/23 1824 10/08/23 1858  BP: 131/83  (!) 144/87 (!) 142/83  Pulse: 70   76  Resp: 17  20 (!) 25  Temp: 98.2 F (36.8 C) 98.4 F (36.9 C) 98.1 F (36.7 C) 97.7 F (36.5 C)  TempSrc:  Oral Oral Oral  SpO2:    100%  Weight:      Height:        WBC     Component Value Date/Time   WBC 5.2  10/08/2023 1123   LYMPHSABS 1.8 01/04/2016 0205   MONOABS 0.5 01/04/2016 0205  EOSABS 0.2 01/04/2016 0205   BASOSABS 0.0 01/04/2016 0205     Procalcitonin   Ordered      UA   no evidence of UTI    Urine analysis:    Component Value Date/Time   COLORURINE YELLOW 10/08/2023 1052   APPEARANCEUR HAZY (A) 10/08/2023 1052   LABSPEC 1.015 10/08/2023 1052   PHURINE 5.0 10/08/2023 1052   GLUCOSEU NEGATIVE 10/08/2023 1052   HGBUR NEGATIVE 10/08/2023 1052   BILIRUBINUR NEGATIVE 10/08/2023 1052   KETONESUR NEGATIVE 10/08/2023 1052   PROTEINUR NEGATIVE 10/08/2023 1052   NITRITE NEGATIVE 10/08/2023 1052   LEUKOCYTESUR TRACE (A) 10/08/2023 1052    Results for orders placed or performed during the hospital encounter of 10/08/23  Resp panel by RT-PCR (RSV, Flu A&B, Covid) Anterior Nasal Swab     Status: None   Collection Time: 10/08/23 10:22 AM   Specimen: Anterior Nasal Swab  Result Value Ref Range Status   SARS Coronavirus 2 by RT PCR NEGATIVE NEGATIVE Final   Influenza A by PCR NEGATIVE NEGATIVE Final   Influenza B by PCR NEGATIVE NEGATIVE Final         Resp Syncytial Virus by PCR NEGATIVE NEGATIVE Final          ABX started Antibiotics Given (last 72 hours)     Date/Time Action Medication Dose Rate   10/08/23 1823 New Bag/Given   cefTRIAXone  (ROCEPHIN ) 1 g in sodium chloride  0.9 % 100 mL IVPB 1 g 200 mL/hr   10/08/23 1931 New Bag/Given   azithromycin  (ZITHROMAX ) 500 mg in sodium chloride  0.9 % 250 mL IVPB 500 mg 250 mL/hr         _______________________________________________ Recent Labs  Lab 10/08/23 1123  NA 137  K 4.3  CO2 23  GLUCOSE 101*  BUN 8  CREATININE 0.78  CALCIUM  8.7*    Cr  stable,   Lab Results  Component Value Date   CREATININE 0.78 10/08/2023   CREATININE 0.87 11/04/2019   CREATININE 0.98 01/07/2016    Recent Labs  Lab 10/08/23 1123  AST 22  ALT 13  ALKPHOS 52  BILITOT <0.2  PROT 7.1  ALBUMIN 3.4*   Lab Results  Component Value  Date   CALCIUM  8.7 (L) 10/08/2023   PHOS 4.9 (H) 01/02/2016    Plt: Lab Results  Component Value Date   PLT 220 10/08/2023    Recent Labs  Lab 10/08/23 1123  WBC 5.2  HGB 12.0  HCT 38.6  MCV 82.0  PLT 220    HG/HCT   stable,      Component Value Date/Time   HGB 12.0 10/08/2023 1123   HCT 38.6 10/08/2023 1123   MCV 82.0 10/08/2023 1123     _______________________________________________ Hospitalist was called for admission for   Community acquired pneumonia vs bronchitis    The following Work up has been ordered so far:  Orders Placed This Encounter  Procedures   Resp panel by RT-PCR (RSV, Flu A&B, Covid) Anterior Nasal Swab   Culture, blood (routine x 2)   DG Chest 2 View   CT Angio Chest PE W and/or Wo Contrast   Comprehensive metabolic panel   CBC   Urinalysis, Routine w reflex microscopic -Urine, Clean Catch   hCG, serum, qualitative   Diet NPO time specified   Document Height and Actual Weight   Document Height and Actual Weight   If O2 Sat <94% administer O2 at 2 liters/minute via nasal cannula   Consult to hospitalist  CBG monitoring, ED   EKG 12-Lead   ED EKG   ED EKG     OTHER Significant initial  Findings:  labs showing:     DM  labs:  HbA1C: No results for input(s): HGBA1C in the last 8760 hours.     CBG (last 3)  Recent Labs    10/08/23 1012  GLUCAP 109*          Cultures:    Component Value Date/Time   SDES SPUTUM 12/29/2015 2220   SPECREQUEST Normal 12/29/2015 2220   CULT FEW STREPTOCOCCUS GROUP F 12/29/2015 2220   REPTSTATUS 01/01/2016 FINAL 12/29/2015 2220     Radiological Exams on Admission: CT Angio Chest PE W and/or Wo Contrast Result Date: 10/08/2023 CLINICAL DATA:  Shortness of breath for several days EXAM: CT ANGIOGRAPHY CHEST WITH CONTRAST TECHNIQUE: Multidetector CT imaging of the chest was performed using the standard protocol during bolus administration of intravenous contrast. Multiplanar CT image  reconstructions and MIPs were obtained to evaluate the vascular anatomy. RADIATION DOSE REDUCTION: This exam was performed according to the departmental dose-optimization program which includes automated exposure control, adjustment of the mA and/or kV according to patient size and/or use of iterative reconstruction technique. CONTRAST:  75mL OMNIPAQUE  IOHEXOL  350 MG/ML SOLN COMPARISON:  Chest x-ray from earlier in the same day. FINDINGS: Cardiovascular: Thoracic aorta shows no aneurysmal dilatation or dissection. The pulmonary artery shows a normal branching pattern bilaterally. No intraluminal filling defect to suggest pulmonary embolism is seen. Heart is not significantly enlarged in size. No significant coronary calcifications are noted. The pulmonary arterial trunk is mildly prominent which may represent some pulmonary arterial hypertension. Mediastinum/Nodes: Thoracic inlet is within normal limits. No hilar or mediastinal adenopathy is noted. The esophagus as visualized is within normal limits. Lungs/Pleura: Lungs are well aerated bilaterally. Subpleural fibrotic changes are again identified bilaterally worse in the bases. Scattered parenchymal nodules are noted particularly within the fibrotic changes which overall are stable in appearance. There are some nodules which demonstrates some enlargement when compared with the prior exam. Particularly in the bases. A 10 mm nodule is noted on image number 67 of series 7 slightly increased from the prior exam at which time it measured 8 mm. 8 mm nodule in the left lower lobe posteriorly is noted on image number 78 of series 7 which has increased in the interval from the prior exam at which time it measured 7 mm. Some more confluence of some of the smaller nodules is noted when compared with the prior exam suggesting enlargement although likely related to the interval confluence. Upper Abdomen: Visualized upper abdomen shows evidence of cholelithiasis. No complicating  factors are noted. Musculoskeletal: No chest wall abnormality. No acute or significant osseous findings. Review of the MIP images confirms the above findings. IMPRESSION: No evidence of pulmonary embolism. Some findings suggestive of pulmonary arterial hypertension are noted. Diffuse fibrotic changes particularly in the bases bilaterally. Associated nodularity is seen which is slightly increased in the interval from 2022 felt to be progression of scleroderma related fibrosis and nodular change. Given the increase in size from the prior exam, follow-up examination in 6 months is recommended to assess for stability. Cholelithiasis Electronically Signed   By: Oneil Devonshire M.D.   On: 10/08/2023 19:44   DG Chest 2 View Result Date: 10/08/2023 CLINICAL DATA:  Three day history of shortness of breath EXAM: CHEST - 2 VIEW COMPARISON:  Chest radiograph dated 02/08/2023 FINDINGS: Unchanged asymmetric elevation of the right hemidiaphragm. Normal lung  volumes. Increased conspicuity patchy reticular opacities throughout the right lung and left lower lung. Blunting of right costophrenic angle. No pneumothorax. The heart size and mediastinal contours are within normal limits. No acute osseous abnormality. IMPRESSION: 1. Increased conspicuity of patchy reticular opacities throughout the right lung and left lower lung, which may reflect worsening fibrosis. Superimposed infection/inflammation is not excluded. 2. Blunting of the right costophrenic angle, which may represent a small pleural effusion. Electronically Signed   By: Limin  Xu M.D.   On: 10/08/2023 10:51   _______________________________________________________________________________________________________ Latest  Blood pressure (!) 142/83, pulse 76, temperature 97.7 F (36.5 C), temperature source Oral, resp. rate (!) 25, height 6' (1.829 m), weight 115 kg, SpO2 100%.   Vitals  labs and radiology finding personally reviewed  Review of Systems:   non-productive  cough,   Pertinent positives include:   chills, fatigue,  shortness of breath at rest. dyspnea on exertion,  Constitutional:  No weight loss, night sweats, Fevers,weight loss  HEENT:  No headaches, Difficulty swallowing,Tooth/dental problems,Sore throat,  No sneezing, itching, ear ache, nasal congestion, post nasal drip,  Cardio-vascular:  No chest pain, Orthopnea, PND, anasarca, dizziness, palpitations.no Bilateral lower extremity swelling  GI:  No heartburn, indigestion, abdominal pain, nausea, vomiting, diarrhea, change in bowel habits, loss of appetite, melena, blood in stool, hematemesis Resp:  no No excess mucus, no productive cough, NoNo coughing up of blood.No change in color of mucus.No wheezing. Skin:  no rash or lesions. No jaundice GU:  no dysuria, change in color of urine, no urgency or frequency. No straining to urinate.  No flank pain.  Musculoskeletal:  No joint pain or no joint swelling. No decreased range of motion. No back pain.  Psych:  No change in mood or affect. No depression or anxiety. No memory loss.  Neuro: no localizing neurological complaints, no tingling, no weakness, no double vision, no gait abnormality, no slurred speech, no confusion  All systems reviewed and apart from HOPI all are negative _______________________________________________________________________________________________ Past Medical History:   Past Medical History:  Diagnosis Date   Anemia    Scleredema (HCC)       Past Surgical History:  Procedure Laterality Date   BREAST BIOPSY Right    BREAST CYST EXCISION Left 1996   x2   CESAREAN SECTION     CESAREAN SECTION N/A 12/21/2015   Procedure: CESAREAN SECTION;  Surgeon: Rexene Hoit, MD;  Location: [facility] BIRTHING SUITES;  Service: Obstetrics;  Laterality: N/A;   CYST REMOVAL HAND  2008   DILATION AND EVACUATION N/A 06/28/2014   Procedure: DILATATION AND EVACUATION;  Surgeon: Rexene Hoit, MD;  Location: [facility] ORS;  Service: Gynecology;   Laterality: N/A;   WISDOM TOOTH EXTRACTION      Social History:  Ambulatory   independently      reports that she has never smoked. She has never used smokeless tobacco. She reports that she does not drink alcohol and does not use drugs.   Family History:   Family History  Problem Relation Age of Onset   Diabetes Father    Breast cancer Neg Hx    ______________________________________________________________________________________________ Allergies: No Known Allergies   Prior to Admission medications   Medication Sig Start Date End Date Taking? Authorizing Provider  albuterol  (VENTOLIN  HFA) 108 (90 Base) MCG/ACT inhaler Inhale 2 puffs into the lungs every 6 (six) hours as needed for wheezing or shortness of breath. 09/08/21   Darlean Ozell NOVAK, MD  amLODipine (NORVASC) 10 MG tablet Take 10 mg by  mouth daily. 05/20/19   [provider]  budesonide -formoterol  (SYMBICORT ) 160-4.5 MCG/ACT inhaler Inhale 2 puffs into the lungs 2 (two) times daily. 10/03/18   Darlean Ozell NOVAK, MD  Cholecalciferol (VITAMIN D) 2000 UNITS tablet Take 2,000 Units by mouth daily.    [provider]  diclofenac Sodium (VOLTAREN) 1 % GEL Voltaren 1 % topical gel    [provider]  esomeprazole  (NEXIUM ) 40 MG capsule Take 30- 60 min before your first and last meals of the day Patient taking differently: Take 40 mg by mouth 2 (two) times daily before a meal. Take 30- 60 min before your first and last meals of the day 06/22/15   Darlean Ozell NOVAK, MD  hydroxychloroquine  (PLAQUENIL ) 200 MG tablet Take 200 mg by mouth 3 (three) times daily. Pt takes 2 tablets 400mg  in the morning and one tablet 200mg  at night    [provider]  ibuprofen  (ADVIL ,MOTRIN ) 800 MG tablet Take 800 mg by mouth every 8 (eight) hours as needed.    [provider]  ipratropium-albuterol  (DUONEB) 0.5-2.5 (3) MG/3ML SOLN Take 3 mLs by nebulization every 4 (four) hours as needed. 12/05/20   Darlean Ozell NOVAK,  MD  mycophenolate  (CELLCEPT ) 500 MG tablet Take 500 mg by mouth 2 (two) times daily. 12/02/19   [provider]  NIFEdipine  (ADALAT  CC) 30 MG 24 hr tablet Take by mouth. 04/23/19 04/22/20  [provider]  pentoxifylline  (TRENTAL ) 400 MG CR tablet Take 400 mg by mouth 3 (three) times daily. 05/20/19   [provider]  prazosin  (MINIPRESS ) 1 MG capsule Take 1 mg by mouth 3 (three) times daily. 05/20/19   [provider]  Prenatal 27-1 MG TABS Take 1 tablet by mouth daily. 05/15/19   [provider]  valACYclovir (VALTREX) 500 MG tablet Take 500 mg by mouth daily.    [provider]    ___________________________________________________________________________________________________ Physical Exam:    10/08/2023    6:58 PM 10/08/2023    6:24 PM 10/08/2023    3:48 PM  Vitals with BMI  Systolic 142 144 868  Diastolic 83 87 83  Pulse 76  70     1. General:  in No  Acute distress   Chronically ill   -appearing 2. Psychological: Alert and   Oriented 3. Head/ENT:    Dry Mucous Membranes                          Head Non traumatic, neck supple                         Poor Dentition 4. SKIN:  decreased Skin turgor,  Skin clean noted scleroderma lesions on the hands    5. Heart: Regular rate and rhythm no  Murmur, no Rub or gallop 6. Lungs:  no wheezes fine crackles   7. Abdomen: Soft,  non-tender, Non distended  bowel sounds present 8. Lower extremities: no clubbing, cyanosis, no  edema 9. Neurologically Grossly intact, moving all 4 extremities equally  10. MSK: Normal range of motion    Chart has been reviewed  ______________________________________________________________________________________________  Assessment/Plan  48 y.o. female with medical history significant of scleroderma, interstitial lung disease pulmonary hypertension, history of heart failure  Admitted for  Community acquired pneumonia vs bronchitis    Present on  Admission:  Bronchitis  Dyspnea  Pulmonary arterial hypertension (HCC)  Scleroderma involving lung (HCC)     Bronchitis  Versus atypical pneumonia.  Patient with history of interstitial lung disease scleroderma and pulmonary hypertension appreciate pulmonology consult they recommend for now continue with Rocephin  and azithromycin  Order respiratory panel blood cultures Fungitell N.p.o. postmidnight in case needs bronchoscopy in the morning.  Continue inhalers as needed Patient had 1 dose of steroids in the emergency department will hold off for now  Dyspnea Most likely secondary to pulmonary hypertension versus scleroderma but for completion will obtain echogram CTA negative for PE  Pulmonary arterial hypertension (HCC) Continue nifedipine  and prazosin  appreciate pulmonology consult  Scleroderma involving lung (HCC) CellCept  and Plaquenil  for tonight until infectious etiology ruled out    Other plan as per orders.  DVT prophylaxis:  SCD     Code Status:    Code Status: Prior FULL CODE   as per patient   I had personally discussed CODE STATUS with patient  ACP   none    Family Communication:   Family not at  Bedside    Diet  Diet Orders (From admission, onward)     Start     Ordered   10/09/23 0001  Diet NPO time specified  Diet effective midnight        10/08/23 2034   10/08/23 1000  Diet NPO time specified  Diet effective now        10/08/23 0959          Heart healthy diet for now n.p.o. postmidnight  Disposition Plan:       To home once workup is complete and patient is stable   Following barriers for discharge:                             Seen by pulmonology                              able to transition to PO antibiotics                             Will need to be able to tolerate PO                                  Consult Orders  (From admission, onward)           Start     Ordered   10/08/23 1802  Consult to hospitalist  Paged by  Fabian   Once       Provider:  (Not yet assigned)  Question Answer Comment  Place call to: [facility]   Reason for Consult Admit      10/08/23 1801                                Consults called: Pulmonology    Admission status:  ED Disposition     ED Disposition  Admit   Condition  --   Comment  Hospital Area: [facility] [[id number]]  Level of Care: Telemetry Medical [[id number]]  Covid Evaluation: Asymptomatic - no recent exposure (last 10 days) testing not required  Diagnosis: Bronchitis [[id number]]  Admitting Physician: Caledonia Zou [[id number]]  Attending Physician: Katty Fretwell [[id number]]  For patients discharging to extended facilities (i.e. SNF, AL, group homes or LTAC) initiate:: Discharge to  SNF/Facility Placement COVID-19 Lab Testing Protocol           Obs   Need for IV antibiotics, IV fluids,,     Level of care     tele  For  24H     Tywon Niday 10/08/2023, 8:42 PM    [facility]     after 2 AM please page floor coverage   If 7AM-7PM, please contact the day team taking care of the patient using [email or web address]

## 2023-10-08 NOTE — Assessment & Plan Note (Addendum)
 Most likely secondary to pulmonary hypertension versus scleroderma but for completion will obtain echogram CTA negative for PE

## 2023-10-08 NOTE — Assessment & Plan Note (Signed)
 Versus atypical pneumonia.  Patient with history of interstitial lung disease scleroderma and pulmonary hypertension appreciate pulmonology consult they recommend for now continue with Rocephin  and azithromycin  Order respiratory panel blood cultures Fungitell N.p.o. postmidnight in case needs bronchoscopy in the morning.  Continue inhalers as needed Patient had 1 dose of steroids in the emergency department will hold off for now

## 2023-10-08 NOTE — Subjective & Objective (Signed)
 Patient was sent by urgent care for shortness of breath for the past 3 days has been getting progressively worse no chest pain no nausea no vomiting has been having some cough productive of clear sputum patient has underlying scleroderma and pulmonary fibrosis with interstitial lung disease and has some slight shortness of breath at baseline but has been getting worse no associated fevers no leg edema Albuterol  helped her but only for a little bit Patient is immunocompromise she takes CellCept  and Plaquenil 

## 2023-10-08 NOTE — ED Notes (Signed)
 [facility] contacted to change out the sharps bin.

## 2023-10-08 NOTE — ED Triage Notes (Signed)
 Pt sent from [facility] for further evaluation of sob x3 days. Speaking in full sentences,

## 2023-10-09 ENCOUNTER — Encounter (HOSPITAL_COMMUNITY): Payer: Self-pay | Admitting: Internal Medicine

## 2023-10-09 ENCOUNTER — Other Ambulatory Visit: Payer: Self-pay

## 2023-10-09 ENCOUNTER — Telehealth: Payer: Self-pay | Admitting: Internal Medicine

## 2023-10-09 ENCOUNTER — Observation Stay (HOSPITAL_BASED_OUTPATIENT_CLINIC_OR_DEPARTMENT_OTHER)

## 2023-10-09 DIAGNOSIS — R918 Other nonspecific abnormal finding of lung field: Secondary | ICD-10-CM

## 2023-10-09 DIAGNOSIS — M359 Systemic involvement of connective tissue, unspecified: Secondary | ICD-10-CM

## 2023-10-09 DIAGNOSIS — Z7951 Long term (current) use of inhaled steroids: Secondary | ICD-10-CM | POA: Diagnosis not present

## 2023-10-09 DIAGNOSIS — B348 Other viral infections of unspecified site: Secondary | ICD-10-CM | POA: Diagnosis not present

## 2023-10-09 DIAGNOSIS — I270 Primary pulmonary hypertension: Secondary | ICD-10-CM | POA: Diagnosis not present

## 2023-10-09 DIAGNOSIS — J40 Bronchitis, not specified as acute or chronic: Secondary | ICD-10-CM | POA: Diagnosis not present

## 2023-10-09 DIAGNOSIS — R0600 Dyspnea, unspecified: Secondary | ICD-10-CM | POA: Diagnosis not present

## 2023-10-09 DIAGNOSIS — M3481 Systemic sclerosis with lung involvement: Secondary | ICD-10-CM | POA: Diagnosis not present

## 2023-10-09 DIAGNOSIS — R0609 Other forms of dyspnea: Secondary | ICD-10-CM | POA: Diagnosis not present

## 2023-10-09 DIAGNOSIS — J849 Interstitial pulmonary disease, unspecified: Secondary | ICD-10-CM | POA: Diagnosis not present

## 2023-10-09 LAB — COMPREHENSIVE METABOLIC PANEL WITH GFR
ALT: 12 U/L (ref 0–44)
AST: 21 U/L (ref 15–41)
Albumin: 3.6 g/dL (ref 3.5–5.0)
Alkaline Phosphatase: 63 U/L (ref 38–126)
Anion gap: 10 (ref 5–15)
BUN: 6 mg/dL (ref 6–20)
CO2: 24 mmol/L (ref 22–32)
Calcium: 8.9 mg/dL (ref 8.9–10.3)
Chloride: 104 mmol/L (ref 98–111)
Creatinine, Ser: 0.7 mg/dL (ref 0.44–1.00)
GFR, Estimated: >60 mL/min (ref >60)
Glucose, Bld: 133 mg/dL — ABNORMAL HIGH (ref 70–99)
Potassium: 3.8 mmol/L (ref 3.5–5.1)
Sodium: 138 mmol/L (ref 135–145)
Total Bilirubin: 0.5 mg/dL (ref 0.0–1.2)
Total Protein: 7.6 g/dL (ref 6.5–8.1)

## 2023-10-09 LAB — RESPIRATORY PANEL BY PCR

## 2023-10-09 LAB — CBC
HCT: 41.3 % (ref 36.0–46.0)
Hemoglobin: 13 g/dL (ref 12.0–15.0)
MCH: 25.2 pg — ABNORMAL LOW (ref 26.0–34.0)
MCHC: 31.5 g/dL (ref 30.0–36.0)
MCV: 80 fL (ref 80.0–100.0)
Platelets: 241 K/uL (ref 150–400)
RBC: 5.16 MIL/uL — ABNORMAL HIGH (ref 3.87–5.11)
RDW: 14.5 % (ref 11.5–15.5)
WBC: 5.1 K/uL (ref 4.0–10.5)
nRBC: 0 % (ref 0.0–0.2)

## 2023-10-09 LAB — MAGNESIUM: Magnesium: 1.9 mg/dL (ref 1.7–2.4)

## 2023-10-09 LAB — ECHOCARDIOGRAM COMPLETE
Area-P 1/2: 3.56 cm2
Calc EF: 68.1 %
Height: 72 in
S' Lateral: 2.8 cm
Single Plane A2C EF: 65.3 %
Single Plane A4C EF: 68.3 %
Weight: 3686.09 [oz_av]

## 2023-10-09 LAB — STREP PNEUMONIAE URINARY ANTIGEN: Strep Pneumo Urinary Antigen: NEGATIVE

## 2023-10-09 LAB — PREALBUMIN: Prealbumin: 20 mg/dL (ref 18–38)

## 2023-10-09 LAB — PHOSPHORUS: Phosphorus: 2.4 mg/dL — ABNORMAL LOW (ref 2.5–4.6)

## 2023-10-09 LAB — LACTIC ACID, PLASMA: Lactic Acid, Venous: 1.3 mmol/L (ref 0.5–1.9)

## 2023-10-09 LAB — HIV ANTIBODY (ROUTINE TESTING W REFLEX): HIV Screen 4th Generation wRfx: NONREACTIVE

## 2023-10-09 MED ORDER — POTASSIUM PHOSPHATES 15 MMOLE/5ML IV SOLN
30.0000 mmol | Freq: Once | INTRAVENOUS | Status: AC
  Administered 2023-10-09: 30 mmol via INTRAVENOUS
  Filled 2023-10-09: qty 10

## 2023-10-09 MED ORDER — AZELASTINE HCL 0.1 % NA SOLN
1.0000 | Freq: Two times a day (BID) | NASAL | Status: DC
  Administered 2023-10-09 – 2023-10-10 (×2): 1 via NASAL
  Filled 2023-10-09: qty 30

## 2023-10-09 MED ORDER — SALINE SPRAY 0.65 % NA SOLN: 1.0000 | NASAL | Status: DC | PRN

## 2023-10-09 MED ORDER — ORAL CARE MOUTH RINSE: 15.0000 mL | OROMUCOSAL | Status: DC | PRN

## 2023-10-09 MED ORDER — AZITHROMYCIN 500 MG PO TABS: 500.0000 mg | ORAL_TABLET | Freq: Every day | ORAL | Status: DC

## 2023-10-09 MED ORDER — PHENOL 1.4 % MT LIQD
1.0000 | OROMUCOSAL | Status: DC | PRN
  Administered 2023-10-09: 1 via OROMUCOSAL
  Filled 2023-10-09: qty 177

## 2023-10-09 MED ORDER — MAGNESIUM SULFATE 2 GM/50ML IV SOLN
2.0000 g | Freq: Once | INTRAVENOUS | Status: AC
  Administered 2023-10-09: 2 g via INTRAVENOUS
  Filled 2023-10-09: qty 50

## 2023-10-09 MED ORDER — HYDROCOD POLI-CHLORPHE POLI ER 10-8 MG/5ML PO SUER
5.0000 mL | Freq: Three times a day (TID) | ORAL | Status: DC
  Administered 2023-10-09 – 2023-10-10 (×3): 5 mL via ORAL
  Filled 2023-10-09 (×3): qty 5

## 2023-10-09 MED ORDER — MYCOPHENOLATE MOFETIL 250 MG PO CAPS
500.0000 mg | ORAL_CAPSULE | Freq: Two times a day (BID) | ORAL | Status: DC
  Administered 2023-10-09 – 2023-10-10 (×2): 500 mg via ORAL
  Filled 2023-10-09 (×2): qty 2

## 2023-10-09 MED ORDER — HYDROXYCHLOROQUINE SULFATE 200 MG PO TABS
200.0000 mg | ORAL_TABLET | Freq: Two times a day (BID) | ORAL | Status: DC
  Administered 2023-10-09 – 2023-10-10 (×2): 200 mg via ORAL
  Filled 2023-10-09 (×2): qty 1

## 2023-10-09 MED ORDER — BENZOCAINE-MENTHOL 6-10 MG MT LOZG: 1.0000 | LOZENGE | OROMUCOSAL | Status: DC | PRN

## 2023-10-09 MED ORDER — AZITHROMYCIN 500 MG PO TABS
250.0000 mg | ORAL_TABLET | Freq: Every day | ORAL | Status: DC
  Administered 2023-10-09 – 2023-10-10 (×2): 250 mg via ORAL
  Filled 2023-10-09 (×2): qty 1

## 2023-10-09 NOTE — Consult Note (Addendum)
 NAME:  Laura Rowland, MRN:  [id number], DOB:  Sep 28, 1975, LOS: 0 ADMISSION DATE:  10/08/2023, CONSULTATION DATE:  10/09/2023 REFERRING MD:  Silvester  CHIEF COMPLAINT:  Dyspnea, chest pain  History of Present Illness:  Laura Rowland is a 48 yo female with PMH of scleroderma, ILD 2/2 fibrosis, pulmonary hypertension, and immunocompromised status on CellCept  and Plaquenil .  Patient presented with worsening dyspnea and chest pain for 3 days.  She also endorses acute worsening of her chronic cough for the past 4 days.  No fevers.  Endorses brief rhinorrhea, some congestion.  Endorses fatigue for the past few days, worse than normal.  Denies leg swelling.  Patient was using daily Symbicort  maintenance inhaler.  The patient had 4 severe shortness of breath episodes requiring albuterol  inhaler use in the past 3 days.  The patient states she would use albuterol  inhaler for 2 puffs, wait 4 hours, then use it again and add DuoNeb treatment if still dyspneic.  She states this treatment helped mildly and temporarily, but dyspnea kept returning, which brought her to [facility] yesterday where ED evaluation was recommended.  Patient endorses consistently using her maintenance inhaler and immunosuppressive regimen, she is not missing any doses.  Patient states that at baseline she has no respiratory distress or dyspnea at rest.  With exertion (i.e. walking up steps), the patient experiences moderate dyspnea at baseline.  Patient has a chronic dry cough which has been going on for years.   Pertinent  Medical History  Scleroderma with Raynaud's phenomenon ILD   Significant Hospital Events: Including procedures, antibiotic start and stop dates in addition to other pertinent events   RPP 9/16 positive for rhino/enterovirus, s/p 1 dose Solumedrol 125 mg   Interim History / Subjective:  CTA chest slight interval worsening of interstitial fibrosis since 2022.  Chest pain has improved since admission with DuoNeb  treatments.  Patient endorses that chest pain occurs only after coughing spells, which occur when she exerts herself.  She states that breathing has improved after breathing treatments.   Objective    Blood pressure (!) 144/81, pulse 81, temperature 98 F (36.7 C), temperature source Oral, resp. rate 18, height 6' (1.829 m), weight 104.5 kg, SpO2 99%.        Intake/Output Summary (Last 24 hours) at 10/09/2023 1206 Last data filed at 10/09/2023 1107 Gross per 24 hour  Intake 845 ml  Output 0 ml  Net 845 ml   Filed Weights   10/08/23 0957 10/08/23 2357  Weight: 115 kg 104.5 kg   Examination: General: Age-appropriate, resting comfortably in bed, NAD, alert and at baseline. HEENT: No scleral icterus or injections.  No rhinorrhea.  Clear posterior oropharynx. Cardiovascular: Regular rate and rhythm. Normal S1/S2. No murmurs, rubs, or gallops appreciated. 2+ radial pulses. Pulmonary: Scattered fine crackles throughout all lung fields.  No wheezes or rhonchi.  No increased WOB, no accessory muscle usage on room air. Abdominal: No tenderness to deep or light palpation. No rebound or guarding, nondistended. No HSM. Skin: Scleroderma changes over bilateral hands with thickened, tense skin limiting ROM. Extremities: Clubbing of nails on bilateral hands.  No peripheral edema bilaterally. Capillary refill 2-3 seconds.   Assessment and Plan   Scleroderma Interstitial fibrosis Possible interval progression of fibrosis from prior CT 2022. - s/p 1 dose SoluMedrol - Patient has been seeing Dr. Fairy Angelucci with [facility], will call to discuss prednisone  course in setting of scleroderma - Outpatient follow up with [facility]  Enlarged pulmonary artery  on CT scan Noted as possibility on CT scan but unclear if true finding, will rule out with further workup. - Obtain echocardiogram - BNP tomorrow AM - Patient would need right heart cath if echo and BNP suggest pulmonary  hypertension or right heart failure  Rhinovirus URI Dyspnea, cough Improving with treatment, stable without respiratory distress at rest.  Will optimize supportive care, difficult to stop viral cough. - Continue incentive spirometry - Continue DuoNebs Q4h PRN - Tussionex 5 mL Q8h, hold if drowsy - Chloraseptic lozenge PRN - Would recommend ambulatory pulse oximetry prior to discharge, ear probe or forehead probe - Discontinue CTX given procal <1, no PNA on imaging - Transition from azithromycin  IV to PO course x5 days total for antiinflammatory properties   Labs   CBC: Recent Labs  Lab 10/08/23 1123 10/09/23 0021  WBC 5.2 5.1  HGB 12.0 13.0  HCT 38.6 41.3  MCV 82.0 80.0  PLT 220 241    Basic Metabolic Panel: Recent Labs  Lab 10/08/23 1123 10/08/23 2219 10/09/23 0021  NA 137  --  138  K 4.3  --  3.8  CL 106  --  104  CO2 23  --  24  GLUCOSE 101*  --  133*  BUN 8  --  6  CREATININE 0.78  --  0.70  CALCIUM  8.7*  --  8.9  MG  --  2.1 1.9  PHOS  --  2.0* 2.4*   GFR: Estimated Creatinine Clearance: 118.9 mL/min (by C-G formula based on SCr of 0.7 mg/dL). Recent Labs  Lab 10/08/23 1123 10/08/23 2219 10/09/23 0021  PROCALCITON  --  <0.10  --   WBC 5.2  --  5.1  LATICACIDVEN  --  0.9 1.3    Liver Function Tests: Recent Labs  Lab 10/08/23 1123 10/09/23 0021  AST 22 21  ALT 13 12  ALKPHOS 52 63  BILITOT <0.2 0.5  PROT 7.1 7.6  ALBUMIN 3.4* 3.6   No results for input(s): LIPASE, AMYLASE in the last 168 hours. No results for input(s): AMMONIA in the last 168 hours.  ABG No results found for: PHART, PCO2ART, PO2ART, HCO3, TCO2, ACIDBASEDEF, O2SAT   Coagulation Profile: No results for input(s): INR, PROTIME in the last 168 hours.  Cardiac Enzymes: Recent Labs  Lab 10/08/23 2219  CKTOTAL 147    HbA1C: No results found for: HGBA1C  CBG: Recent Labs  Lab 10/08/23 1012  GLUCAP 109*    Past Medical History:  She,   has a past medical history of Anemia and Scleredema (HCC).   Surgical History:   Past Surgical History:  Procedure Laterality Date   BREAST BIOPSY Right    BREAST CYST EXCISION Left 1996   x2   CESAREAN SECTION     CESAREAN SECTION N/A 12/21/2015   Procedure: CESAREAN SECTION;  Surgeon: Rexene Hoit, MD;  Location: [facility] BIRTHING SUIT[facility];  Service: Obstetrics;  Laterality: N/A;   CYST REMOVAL HAND  2008   DILATION AND EVACUATION N/A 06/28/2014   Procedure: DILATATION AND EVACUATION;  Surgeon: Rexene Hoit, MD;  Location: [facility] ORS;  Service: Gynecology;  Laterality: N/A;   WISDOM TOOTH EXTRACTION       Social History:   reports that she has never smoked. She has never used smokeless tobacco. She reports that she does not drink alcohol and does not use drugs.   Family History:  Her family history includes Diabetes in her father. There is no history of Breast cancer.   Allergies  No Known Allergies   Home Medications  Prior to Admission medications   Medication Sig Start Date End Date Taking? Authorizing Provider  albuterol  (VENTOLIN  HFA) 108 (90 Base) MCG/ACT inhaler Inhale 2 puffs into the lungs every 6 (six) hours as needed for wheezing or shortness of breath. 09/08/21  Yes Darlean Ozell NOVAK, MD  amLODipine (NORVASC) 10 MG tablet Take 10 mg by mouth daily. 05/20/19  Yes [provider]  budesonide -formoterol  (SYMBICORT ) 160-4.5 MCG/ACT inhaler Inhale 2 puffs into the lungs 2 (two) times daily. 10/03/18  Yes Darlean Ozell NOVAK, MD  cetirizine (ZYRTEC ALLERGY) 10 MG tablet Take 10 mg by mouth daily. 09/29/21  Yes [provider]  Cholecalciferol (VITAMIN D) 2000 UNITS tablet Take 2,000 Units by mouth every evening.   Yes [provider]  diclofenac Sodium (VOLTAREN) 1 % GEL Apply 2 g topically daily as needed (for pain).   Yes [provider]  esomeprazole  (NEXIUM ) 40 MG capsule Take 30- 60 min before your first and last meals of the day Patient taking differently: Take  40 mg by mouth 2 (two) times daily before a meal. Take 30- 60 min before your first and last meals of the day 06/22/15  Yes Darlean Ozell NOVAK, MD  hydroxychloroquine  (PLAQUENIL ) 200 MG tablet Take 200 mg by mouth 2 (two) times daily.   Yes [provider]  ipratropium-albuterol  (DUONEB) 0.5-2.5 (3) MG/3ML SOLN Take 3 mLs by nebulization every 4 (four) hours as needed. Patient taking differently: Take 3 mLs by nebulization every 4 (four) hours as needed (for shortness of breath). 12/05/20  Yes Darlean Ozell NOVAK, MD  mycophenolate  (CELLCEPT ) 500 MG tablet Take 500 mg by mouth 2 (two) times daily. 12/02/19  Yes [provider]  pentoxifylline  (TRENTAL ) 400 MG CR tablet Take 400 mg by mouth 2 (two) times daily. 05/20/19  Yes [provider]  phentermine (ADIPEX-P) 37.5 MG tablet Take 37.5 mg by mouth daily before breakfast. 11/20/21  Yes [provider]  prazosin  (MINIPRESS ) 1 MG capsule Take 1 mg by mouth 2 (two) times daily. 05/20/19  Yes [provider]  Prenatal 27-1 MG TABS Take 1 tablet by mouth daily. 05/15/19  Yes [provider]  valACYclovir (VALTREX) 500 MG tablet Take 500 mg by mouth daily.   Yes [provider]  ibuprofen  (ADVIL ,MOTRIN ) 800 MG tablet Take 800 mg by mouth every 8 (eight) hours as needed.    [provider]  NIFEdipine  (ADALAT  CC) 30 MG 24 hr tablet Take by mouth. 04/23/19 04/22/20  [provider]     Harm Jou Toma, MD PGY-2, [facility] 10/09/23, 12:06 PM

## 2023-10-09 NOTE — Telephone Encounter (Signed)
 Scleroderma ild Used to see Dr Darlean Now in hospital with Rhinovirus Needs to be established with myself - she is requesting  Needs  - both ILD and hospital discharge followupo - first avail PFT (oprdred already) -30 min time slot in 4-8 weeks with MR /APP  - 30 min time slot 12-16 weeks with MR - so she needs 2 appots

## 2023-10-09 NOTE — Telephone Encounter (Signed)
Patient scheduled for both appointments.

## 2023-10-09 NOTE — Progress Notes (Signed)
  Echocardiogram 2D Echocardiogram has been performed.  Norleen ORN [facility] 10/09/2023, 2:34 PM

## 2023-10-09 NOTE — Progress Notes (Deleted)
 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  Patient presented with worsening dyspnea and chest pain for 3 days.  She also endorses acute worsening of her chronic cough for the past 4 days.  No fevers.  Endorses brief rhinorrhea, some congestion.  Endorses fatigue for the past few days, worse than normal.  Denies leg swelling.  Patient was using daily Symbicort  maintenance inhaler.  The patient had 4 severe shortness of breath episodes requiring albuterol  inhaler use in the past 3 days.  The patient states she would use albuterol  inhaler for 2 puffs, wait 4 hours, then use it again and add DuoNeb treatment if still dyspneic.  She states this treatment helped mildly and temporarily, but dyspnea kept returning, which brought her to [facility] yesterday where ED evaluation was recommended.  Patient endorses consistently using her maintenance inhaler and immunosuppressive regimen, she is not missing any doses.  Patient states that at baseline she has no respiratory distress or dyspnea at rest.  With exertion (i.e. walking up steps), the patient experiences moderate dyspnea at baseline.  Patient has a chronic dry cough which has been going on for years.   Pertinent  Medical History  Scleroderma with Raynaud's phenomenon ILD   Significant Hospital Events: Including procedures, antibiotic start and stop dates in addition to other pertinent events   RPP 9/16 positive for rhino/enterovirus, s/p 1 dose Solumedrol 125 mg   Interim History / Subjective:  CTA chest slight interval worsening of interstitial fibrosis since 2022.  Chest pain has improved since admission with DuoNeb  treatments.  Patient endorses that chest pain occurs only after coughing spells, which occur when she exerts herself.  She states that breathing has improved after breathing treatments.   Objective    Blood pressure (!) 144/81, pulse 81, temperature 98 F (36.7 C), temperature source Oral, resp. rate 18, height 6' (1.829 m), weight 104.5 kg, SpO2 99%.        Intake/Output Summary (Last 24 hours) at 10/09/2023 0814 Last data filed at 10/09/2023 0505 Gross per 24 hour  Intake 350 ml  Output 0 ml  Net 350 ml   Filed Weights   10/08/23 0957 10/08/23 2357  Weight: 115 kg 104.5 kg   Examination: General: Age-appropriate, resting comfortably in bed, NAD, alert and at baseline. HEENT: No scleral icterus or injections.  No rhinorrhea.  Clear posterior oropharynx. Cardiovascular: Regular rate and rhythm. Normal S1/S2. No murmurs, rubs, or gallops appreciated. 2+ radial pulses. Pulmonary: Scattered fine crackles throughout all lung fields.  No wheezes or rhonchi.  No increased WOB, no accessory muscle usage on room air. Abdominal: No tenderness to deep or light palpation. No rebound or guarding, nondistended. No HSM. Skin: Scleroderma changes over bilateral hands with thickened, tense skin limiting ROM. Extremities: Clubbing of nails on bilateral hands.  No peripheral edema bilaterally. Capillary refill 2-3 seconds.   Assessment and Plan   Scleroderma Interstitial fibrosis Possible interval progression of fibrosis from prior CT 2022. - s/p 1 dose SoluMedrol - Patient has been seeing Dr. Fairy Angelucci with [facility], will call to discuss prednisone  course in setting of scleroderma - Outpatient follow up with [facility]  Enlarged pulmonary artery  on CT scan Noted as possibility on CT scan but unclear if true finding, will rule out with further workup. - Obtain echocardiogram - BNP tomorrow AM - Patient would need right heart cath if echo and BNP suggest pulmonary  hypertension or right heart failure  Rhinovirus URI Dyspnea, cough Improving with treatment, stable without respiratory distress at rest.  Will optimize supportive care, difficult to stop viral cough. - Continue incentive spirometry - Continue DuoNebs Q4h PRN - Tussionex 5 mL Q8h, hold if drowsy - Chloraseptic lozenge PRN - Would recommend ambulatory pulse oximetry prior to discharge, ear probe or forehead probe - Discontinue CTX given procal <1, no PNA on imaging - Transition from azithromycin  IV to PO course x5 days total for antiinflammatory properties   Labs   CBC: Recent Labs  Lab 10/08/23 1123 10/09/23 0021  WBC 5.2 5.1  HGB 12.0 13.0  HCT 38.6 41.3  MCV 82.0 80.0  PLT 220 241    Basic Metabolic Panel: Recent Labs  Lab 10/08/23 1123 10/08/23 2219 10/09/23 0021  NA 137  --  138  K 4.3  --  3.8  CL 106  --  104  CO2 23  --  24  GLUCOSE 101*  --  133*  BUN 8  --  6  CREATININE 0.78  --  0.70  CALCIUM  8.7*  --  8.9  MG  --  2.1 1.9  PHOS  --  2.0* 2.4*   GFR: Estimated Creatinine Clearance: 118.9 mL/min (by C-G formula based on SCr of 0.7 mg/dL). Recent Labs  Lab 10/08/23 1123 10/08/23 2219 10/09/23 0021  PROCALCITON  --  <0.10  --   WBC 5.2  --  5.1  LATICACIDVEN  --  0.9 1.3    Liver Function Tests: Recent Labs  Lab 10/08/23 1123 10/09/23 0021  AST 22 21  ALT 13 12  ALKPHOS 52 63  BILITOT <0.2 0.5  PROT 7.1 7.6  ALBUMIN 3.4* 3.6   No results for input(s): LIPASE, AMYLASE in the last 168 hours. No results for input(s): AMMONIA in the last 168 hours.  ABG No results found for: PHART, PCO2ART, PO2ART, HCO3, TCO2, ACIDBASEDEF, O2SAT   Coagulation Profile: No results for input(s): INR, PROTIME in the last 168 hours.  Cardiac Enzymes: Recent Labs  Lab 10/08/23 2219  CKTOTAL 147    HbA1C: No results found for: HGBA1C  CBG: Recent Labs  Lab 10/08/23 1012  GLUCAP 109*    Past Medical History:  She,   has a past medical history of Anemia and Scleredema (HCC).   Surgical History:   Past Surgical History:  Procedure Laterality Date   BREAST BIOPSY Right    BREAST CYST EXCISION Left 1996   x2   CESAREAN SECTION     CESAREAN SECTION N/A 12/21/2015   Procedure: CESAREAN SECTION;  Surgeon: Rexene Hoit, MD;  Location: [facility] BIRTHING SUIT[facility];  Service: Obstetrics;  Laterality: N/A;   CYST REMOVAL HAND  2008   DILATION AND EVACUATION N/A 06/28/2014   Procedure: DILATATION AND EVACUATION;  Surgeon: Rexene Hoit, MD;  Location: [facility];  Service: Gynecology;  Laterality: N/A;   WISDOM TOOTH EXTRACTION       Social History:   reports that she has never smoked. She has never used smokeless tobacco. She reports that she does not drink alcohol and does not use drugs.   Family History:  Her family history includes Diabetes in her father. There is no history of Breast cancer.   Allergies  No Known Allergies   Home Medications  Prior to Admission medications   Medication Sig Start Date End Date Taking? Authorizing Provider  albuterol  (VENTOLIN  HFA) 108 (90 Base) MCG/ACT inhaler Inhale 2 puffs into the lungs every 6 (six) hours as needed for wheezing or shortness of breath. 09/08/21  Yes Darlean Ozell NOVAK, MD  amLODipine (NORVASC) 10 MG tablet Take 10 mg by mouth daily. 05/20/19  Yes [provider]  budesonide -formoterol  (SYMBICORT ) 160-4.5 MCG/ACT inhaler Inhale 2 puffs into the lungs 2 (two) times daily. 10/03/18  Yes Darlean Ozell NOVAK, MD  cetirizine (ZYRTEC ALLERGY) 10 MG tablet Take 10 mg by mouth daily. 09/29/21  Yes [provider]  Cholecalciferol (VITAMIN D) 2000 UNITS tablet Take 2,000 Units by mouth every evening.   Yes [provider]  diclofenac Sodium (VOLTAREN) 1 % GEL Apply 2 g topically daily as needed (for pain).   Yes [provider]  esomeprazole  (NEXIUM ) 40 MG capsule Take 30- 60 min before your first and last meals of the day Patient taking differently: Take  40 mg by mouth 2 (two) times daily before a meal. Take 30- 60 min before your first and last meals of the day 06/22/15  Yes Darlean Ozell NOVAK, MD  hydroxychloroquine  (PLAQUENIL ) 200 MG tablet Take 200 mg by mouth 2 (two) times daily.   Yes [provider]  ipratropium-albuterol  (DUONEB) 0.5-2.5 (3) MG/3ML SOLN Take 3 mLs by nebulization every 4 (four) hours as needed. Patient taking differently: Take 3 mLs by nebulization every 4 (four) hours as needed (for shortness of breath). 12/05/20  Yes Darlean Ozell NOVAK, MD  mycophenolate  (CELLCEPT ) 500 MG tablet Take 500 mg by mouth 2 (two) times daily. 12/02/19  Yes [provider]  pentoxifylline  (TRENTAL ) 400 MG CR tablet Take 400 mg by mouth 2 (two) times daily. 05/20/19  Yes [provider]  phentermine (ADIPEX-P) 37.5 MG tablet Take 37.5 mg by mouth daily before breakfast. 11/20/21  Yes [provider]  prazosin  (MINIPRESS ) 1 MG capsule Take 1 mg by mouth 2 (two) times daily. 05/20/19  Yes [provider]  Prenatal 27-1 MG TABS Take 1 tablet by mouth daily. 05/15/19  Yes [provider]  valACYclovir (VALTREX) 500 MG tablet Take 500 mg by mouth daily.   Yes [provider]  ibuprofen  (ADVIL ,MOTRIN ) 800 MG tablet Take 800 mg by mouth every 8 (eight) hours as needed.    [provider]  NIFEdipine  (ADALAT  CC) 30 MG 24 hr tablet Take by mouth. 04/23/19 04/22/20  [provider]     Carmella Kees Toma, MD PGY-2, [facility] 10/09/23, 8:14 AM

## 2023-10-09 NOTE — Progress Notes (Signed)
 New Admission Note:  Arrival Method: Stretcher Mental Orientation: Alert and oriented x 4 Telemetry: Box 10 Assessment: Completed Skin: Warm and dry IV: NSL Pain: Denies Tubes: N/A Safety Measures: Safety Fall Prevention Plan initiated.  Admission: Completed 5 M  Orientation: Patient has been orientated to the room, unit and the staff. Welcome booklet given.  Family: None  Orders have been reviewed and implemented. Will continue to monitor the patient. Call light has been placed within reach and bed alarm has been activated.   Durwood Dee BSN, RN  Phone Number: [phone]

## 2023-10-09 NOTE — Plan of Care (Signed)
  Problem: Activity: Goal: Ability to tolerate increased activity will improve Outcome: Completed/Met

## 2023-10-09 NOTE — TOC CM/SW Note (Signed)
 Transition of Care [facility]) - Inpatient Brief Assessment   Patient Details  Name: Laura Rowland MRN: [id number] Date of Birth: 1975/07/31  Transition of Care [facility]) CM/SW Contact:    Tom-Johnson, Harvest Muskrat, RN Phone Number: 10/09/2023, 12:27 PM   Patient presented to the ED from Urgent Care with worsening Shortness Of Breath, Congestion, Fatigue and Cough with Chest pains. Found to have Rhinovirus, patient was given a dose of Solumedrol. Patient has hx of Scleroderma, Interstitial Lung Disease  2/2 Fibrosis, Pulmonary Hypertension. Patient is Immunocompromised, on CellCept  and Plaquenil . Patient follows with outpatient Rheumatologist.  Patient currently on Oral Zithromax , Inhalers and Neb tx. Pulmonology following.  CM spoke with patient at bedside about needs for post hospital transition. From home with husband and two daughters. Both parents and one brother supportive. Currently unemployed, on disability. Independent with care and able to drive self. Does not have DME's at home.  PCP is Cleotilde Planas, MD and uses [facility] on [address]   No ICM needs or recommendations noted at this time.  Patient not Medically ready for discharge.  CM will continue to follow as patient progresses with care towards discharge.                Transition of Care Asessment: Insurance and Status: Insurance coverage has been reviewed Patient has primary care physician: Yes Home environment has been reviewed: Yes Prior level of function:: Independent Prior/Current Home Services: No current home services Social Drivers of Health Review: SDOH reviewed no interventions necessary Readmission risk has been reviewed: Yes Transition of care needs: no transition of care needs at this time

## 2023-10-09 NOTE — Care Management Obs Status (Signed)
 MEDICARE OBSERVATION STATUS NOTIFICATION   Patient Details  Name: Laura Rowland MRN: [id number] Date of Birth: 06-14-1975   Medicare Observation Status Notification Given:  Yes    Tom-Johnson, Harvest Muskrat, RN 10/09/2023, 2:34 PM

## 2023-10-09 NOTE — Plan of Care (Signed)
   Problem: Education: Goal: Knowledge of General Education information will improve Description Including pain rating scale, medication(s)/side effects and non-pharmacologic comfort measures Outcome: Progressing

## 2023-10-09 NOTE — Progress Notes (Signed)
 PROGRESS NOTE    [name]  FMW:[id number] DOB: 05/30/1975 DOA: 10/08/2023 PCP: Cleotilde Planas, MD   Brief Narrative:  Laura Rowland is a 48 y.o. female with medical history significant of scleroderma, interstitial lung disease pulmonary hypertension, history of heart failure Presented from urgent care with worsening shortness of breath and cough for last 3 days with clear sputum.. Patient is immunocompromise she takes CellCept  and Plaquenil   Assessment & Plan:   Principal Problem:   Bronchitis Active Problems:   Scleroderma involving lung (HCC)   Dyspnea   Pulmonary arterial hypertension (HCC)   Rhinovirus infection   ILD (interstitial lung disease) (HCC)  Viral URI/scleroderma/interstitial fibrosis: Came in with respiratory symptoms, chest x-ray with increased conspicuity of patchy reticular opacities throughout bilateral lung which reflects worsening fibrosis but superimposed infection/inflammation is not excluded.  D-dimer elevated, CTA negative for PE however shows diffuse fibrotic changes.  Patient received Solu-Medrol  as well as IV antibiotics for possible pneumonia in the ED.  However she has now tested positive for rhinovirus.  But due to immunocompromised and weak lungs, she is at risk of bacterial superinfection so it is not unreasonable to continue antibiotics.  Pulmonary has seen her, they have narrowed antibiotics to only azithromycin .  They are planning to talk to patient's rheumatologist about steroids.  Defer management to pulmonology and appreciate their help.  Hypophosphatemia: Replenished.  DVT prophylaxis: SCDs Start: 10/08/23 2353   Code Status: Full Code  Family Communication:  None present at bedside.  Plan of care discussed with patient in length and he/she verbalized understanding and agreed with it.  Status is: Observation The patient will require care spanning > 2 midnights and should be moved to inpatient because: Needs inpatient management and will be  discharged when cleared by pulmonology.   Estimated body mass index is 31.25 kg/m as calculated from the following:   Height as of this encounter: 6' (1.829 m).   Weight as of this encounter: 104.5 kg.    Nutritional Assessment: Body mass index is 31.25 kg/m.SABRA Seen by dietician.  I agree with the assessment and plan as outlined below: Nutrition Status:        . Skin Assessment: I have examined the patient's skin and I agree with the wound assessment as performed by the wound care RN as outlined below:    Consultants:  Pulmonology  Procedures:  None  Antimicrobials:  Anti-infectives (From admission, onward)    Start     Dose/Rate Route Frequency Ordered Stop   10/09/23 2000  azithromycin  (ZITHROMAX ) 500 mg in sodium chloride  0.9 % 250 mL IVPB  Status:  Discontinued        500 mg 250 mL/hr over 60 Minutes Intravenous Every 24 hours 10/08/23 2353 10/09/23 1143   10/09/23 2000  azithromycin  (ZITHROMAX ) tablet 500 mg  Status:  Discontinued        500 mg Oral Daily 10/09/23 1143 10/09/23 1212   10/09/23 1800  cefTRIAXone  (ROCEPHIN ) 2 g in sodium chloride  0.9 % 100 mL IVPB  Status:  Discontinued        2 g 200 mL/hr over 30 Minutes Intravenous Every 24 hours 10/08/23 2353 10/09/23 1143   10/09/23 1300  azithromycin  (ZITHROMAX ) tablet 250 mg        250 mg Oral Daily 10/09/23 1212 10/13/23 0959   10/08/23 1715  cefTRIAXone  (ROCEPHIN ) 1 g in sodium chloride  0.9 % 100 mL IVPB        1 g 200 mL/hr over 30 Minutes  Intravenous  Once 10/08/23 1709 10/08/23 1929   10/08/23 1715  azithromycin  (ZITHROMAX ) 500 mg in sodium chloride  0.9 % 250 mL IVPB        500 mg 250 mL/hr over 60 Minutes Intravenous  Once 10/08/23 1709 10/08/23 2204         Subjective: Patient seen and examined, she says that she is feeling better but not back to baseline.  She says that when she is ambulating in the room, at times she has serious and excessive bout of coughing when she feels very short of  breath.  She does not feel ready to go home yet.  Objective: Vitals:   10/08/23 2355 10/08/23 2357 10/09/23 0500 10/09/23 0811  BP:  (!) 150/92 134/69 (!) 144/81  Pulse: 84 72 76 81  Resp: 18 18 18 18   Temp:  97.6 F (36.4 C) 98.2 F (36.8 C) 98 F (36.7 C)  TempSrc:  Oral Oral Oral  SpO2: 98% 100% 100% 99%  Weight:  104.5 kg    Height:  6' (1.829 m)      Intake/Output Summary (Last 24 hours) at 10/09/2023 1357 Last data filed at 10/09/2023 1107 Gross per 24 hour  Intake 845 ml  Output 0 ml  Net 845 ml   Filed Weights   10/08/23 0957 10/08/23 2357  Weight: 115 kg 104.5 kg    Examination:  General exam: Appears calm and comfortable  Respiratory system: Clear to auscultation. Respiratory effort normal. Cardiovascular system: S1 & S2 heard, RRR. No JVD, murmurs, rubs, gallops or clicks. No pedal edema. Gastrointestinal system: Abdomen is nondistended, soft and nontender. No organomegaly or masses felt. Normal bowel sounds heard. Central nervous system: Alert and oriented. No focal neurological deficits. Extremities: Symmetric 5 x 5 power. Skin: No rashes, lesions or ulcers Psychiatry: Judgement and insight appear normal. Mood & affect appropriate.    Data Reviewed: I have personally reviewed following labs and imaging studies  CBC: Recent Labs  Lab 10/08/23 1123 10/09/23 0021  WBC 5.2 5.1  HGB 12.0 13.0  HCT 38.6 41.3  MCV 82.0 80.0  PLT 220 241   Basic Metabolic Panel: Recent Labs  Lab 10/08/23 1123 10/08/23 2219 10/09/23 0021  NA 137  --  138  K 4.3  --  3.8  CL 106  --  104  CO2 23  --  24  GLUCOSE 101*  --  133*  BUN 8  --  6  CREATININE 0.78  --  0.70  CALCIUM  8.7*  --  8.9  MG  --  2.1 1.9  PHOS  --  2.0* 2.4*   GFR: Estimated Creatinine Clearance: 118.9 mL/min (by C-G formula based on SCr of 0.7 mg/dL). Liver Function Tests: Recent Labs  Lab 10/08/23 1123 10/09/23 0021  AST 22 21  ALT 13 12  ALKPHOS 52 63  BILITOT <0.2 0.5  PROT 7.1  7.6  ALBUMIN 3.4* 3.6   No results for input(s): LIPASE, AMYLASE in the last 168 hours. No results for input(s): AMMONIA in the last 168 hours. Coagulation Profile: No results for input(s): INR, PROTIME in the last 168 hours. Cardiac Enzymes: Recent Labs  Lab 10/08/23 2219  CKTOTAL 147   BNP (last 3 results) No results for input(s): PROBNP in the last 8760 hours. HbA1C: No results for input(s): HGBA1C in the last 72 hours. CBG: Recent Labs  Lab 10/08/23 1012  GLUCAP 109*   Lipid Profile: No results for input(s): CHOL, HDL, LDLCALC, TRIG, CHOLHDL, LDLDIRECT in  the last 72 hours. Thyroid  Function Tests: No results for input(s): TSH, T4TOTAL, FREET4, T3FREE, THYROIDAB in the last 72 hours. Anemia Panel: No results for input(s): VITAMINB12, FOLATE, FERRITIN, TIBC, IRON, RETICCTPCT in the last 72 hours. Sepsis Labs: Recent Labs  Lab 10/08/23 2219 10/09/23 0021  PROCALCITON <0.10  --   LATICACIDVEN 0.9 1.3    Recent Results (from the past 240 hours)  Resp panel by RT-PCR (RSV, Flu A&B, Covid) Anterior Nasal Swab     Status: None   Collection Time: 10/08/23 10:22 AM   Specimen: Anterior Nasal Swab  Result Value Ref Range Status   SARS Coronavirus 2 by RT PCR NEGATIVE NEGATIVE Final   Influenza A by PCR NEGATIVE NEGATIVE Final   Influenza B by PCR NEGATIVE NEGATIVE Final    Comment: (NOTE) The Xpert Xpress SARS-CoV-2/FLU/RSV plus assay is intended as an aid in the diagnosis of influenza from Nasopharyngeal swab specimens and should not be used as a sole basis for treatment. Nasal washings and aspirates are unacceptable for Xpert Xpress SARS-CoV-2/FLU/RSV testing.  Fact Sheet for Patients: BloggerCourse.com  Fact Sheet for Healthcare Providers: SeriousBroker.it  This test is not yet approved or cleared by the [place]  FDA and has been authorized for detection and/or  diagnosis of SARS-CoV-2 by FDA under an Emergency Use Authorization (EUA). This EUA will remain in effect (meaning this test can be used) for the duration of the COVID-19 declaration under Section 564(b)(1) of the Act, 21 U.S.C. section 360bbb-3(b)(1), unless the authorization is terminated or revoked.     Resp Syncytial Virus by PCR NEGATIVE NEGATIVE Final    Comment: (NOTE) Fact Sheet for Patients: BloggerCourse.com  Fact Sheet for Healthcare Providers: SeriousBroker.it  This test is not yet approved or cleared by the [place]  FDA and has been authorized for detection and/or diagnosis of SARS-CoV-2 by FDA under an Emergency Use Authorization (EUA). This EUA will remain in effect (meaning this test can be used) for the duration of the COVID-19 declaration under Section 564(b)(1) of the Act, 21 U.S.C. section 360bbb-3(b)(1), unless the authorization is terminated or revoked.  Performed at [facility], [address], [place], [place] [address]   Culture, blood (routine x 2)     Status: None (Preliminary result)   Collection Time: 10/08/23  6:14 PM   Specimen: BLOOD  Result Value Ref Range Status   Specimen Description BLOOD LEFT ANTECUBITAL  Final   Special Requests   Final    BOTTLES DRAWN AEROBIC AND ANAEROBIC Blood Culture results may not be optimal due to an inadequate volume of blood received in culture bottles   Culture   Final    NO GROWTH < 24 HOURS Performed at [facility], [address] [place], [place] [address]    Report Status PENDING  Incomplete  Culture, blood (routine x 2)     Status: None (Preliminary result)   Collection Time: 10/08/23  6:16 PM   Specimen: BLOOD RIGHT WRIST  Result Value Ref Range Status   Specimen Description BLOOD RIGHT WRIST  Final   Special Requests   Final    BOTTLES DRAWN AEROBIC AND ANAEROBIC Blood Culture results may not be optimal due to an inadequate volume  of blood received in culture bottles   Culture   Final    NO GROWTH < 24 HOURS Performed at [facility], [address] [place], [place] [address]    Report Status PENDING  Incomplete  Respiratory (~20 pathogens)  panel by PCR     Status: Abnormal   Collection Time: 10/08/23  8:26 PM   Specimen: Nasopharyngeal Swab; Respiratory  Result Value Ref Range Status   Adenovirus NOT DETECTED NOT DETECTED Final   Coronavirus 229E NOT DETECTED NOT DETECTED Final    Comment: (NOTE) The Coronavirus on the Respiratory Panel, DOES NOT test for the novel  Coronavirus (2019 nCoV)    Coronavirus HKU1 NOT DETECTED NOT DETECTED Final   Coronavirus NL63 NOT DETECTED NOT DETECTED Final   Coronavirus OC43 NOT DETECTED NOT DETECTED Final   Metapneumovirus NOT DETECTED NOT DETECTED Final   Rhinovirus / Enterovirus DETECTED (A) NOT DETECTED Final   Influenza A NOT DETECTED NOT DETECTED Final   Influenza B NOT DETECTED NOT DETECTED Final   Parainfluenza Virus 1 NOT DETECTED NOT DETECTED Final   Parainfluenza Virus 2 NOT DETECTED NOT DETECTED Final   Parainfluenza Virus 3 NOT DETECTED NOT DETECTED Final   Parainfluenza Virus 4 NOT DETECTED NOT DETECTED Final   Respiratory Syncytial Virus NOT DETECTED NOT DETECTED Final   Bordetella pertussis NOT DETECTED NOT DETECTED Final   Bordetella Parapertussis NOT DETECTED NOT DETECTED Final   Chlamydophila pneumoniae NOT DETECTED NOT DETECTED Final   Mycoplasma pneumoniae NOT DETECTED NOT DETECTED Final    Comment: Performed at [facility], [address], [place], [place] [address]     Radiology Studies: CT Angio Chest PE W and/or Wo Contrast Result Date: 10/08/2023 CLINICAL DATA:  Shortness of breath for several days EXAM: CT ANGIOGRAPHY CHEST WITH CONTRAST TECHNIQUE: Multidetector CT imaging of the chest was performed using the standard protocol during bolus administration of intravenous contrast. Multiplanar CT image reconstructions and MIPs were  obtained to evaluate the vascular anatomy. RADIATION DOSE REDUCTION: This exam was performed according to the departmental dose-optimization program which includes automated exposure control, adjustment of the mA and/or kV according to patient size and/or use of iterative reconstruction technique. CONTRAST:  75mL OMNIPAQUE  IOHEXOL  350 MG/ML SOLN COMPARISON:  Chest x-ray from earlier in the same day. FINDINGS: Cardiovascular: Thoracic aorta shows no aneurysmal dilatation or dissection. The pulmonary artery shows a normal branching pattern bilaterally. No intraluminal filling defect to suggest pulmonary embolism is seen. Heart is not significantly enlarged in size. No significant coronary calcifications are noted. The pulmonary arterial trunk is mildly prominent which may represent some pulmonary arterial hypertension. Mediastinum/Nodes: Thoracic inlet is within normal limits. No hilar or mediastinal adenopathy is noted. The esophagus as visualized is within normal limits. Lungs/Pleura: Lungs are well aerated bilaterally. Subpleural fibrotic changes are again identified bilaterally worse in the bases. Scattered parenchymal nodules are noted particularly within the fibrotic changes which overall are stable in appearance. There are some nodules which demonstrates some enlargement when compared with the prior exam. Particularly in the bases. A 10 mm nodule is noted on image number 67 of series 7 slightly increased from the prior exam at which time it measured 8 mm. 8 mm nodule in the left lower lobe posteriorly is noted on image number 78 of series 7 which has increased in the interval from the prior exam at which time it measured 7 mm. Some more confluence of some of the smaller nodules is noted when compared with the prior exam suggesting enlargement although likely related to the interval confluence. Upper Abdomen: Visualized upper abdomen shows evidence of cholelithiasis. No complicating factors are noted.  Musculoskeletal: No chest wall abnormality. No acute or significant osseous findings. Review of the MIP images confirms the  above findings. IMPRESSION: No evidence of pulmonary embolism. Some findings suggestive of pulmonary arterial hypertension are noted. Diffuse fibrotic changes particularly in the bases bilaterally. Associated nodularity is seen which is slightly increased in the interval from 2022 felt to be progression of scleroderma related fibrosis and nodular change. Given the increase in size from the prior exam, follow-up examination in 6 months is recommended to assess for stability. Cholelithiasis Electronically Signed   By: Oneil Devonshire M.D.   On: 10/08/2023 19:44   DG Chest 2 View Result Date: 10/08/2023 CLINICAL DATA:  Three day history of shortness of breath EXAM: CHEST - 2 VIEW COMPARISON:  Chest radiograph dated 02/08/2023 FINDINGS: Unchanged asymmetric elevation of the right hemidiaphragm. Normal lung volumes. Increased conspicuity patchy reticular opacities throughout the right lung and left lower lung. Blunting of right costophrenic angle. No pneumothorax. The heart size and mediastinal contours are within normal limits. No acute osseous abnormality. IMPRESSION: 1. Increased conspicuity of patchy reticular opacities throughout the right lung and left lower lung, which may reflect worsening fibrosis. Superimposed infection/inflammation is not excluded. 2. Blunting of the right costophrenic angle, which may represent a small pleural effusion. Electronically Signed   By: Limin  Xu M.D.   On: 10/08/2023 10:51    Scheduled Meds:  azelastine   1 spray Each Nare BID   azithromycin   250 mg Oral Daily   chlorpheniramine-HYDROcodone   5 mL Oral Q8H   fluticasone  furoate-vilanterol  1 puff Inhalation Daily   pantoprazole   40 mg Oral Daily   pentoxifylline   400 mg Oral TID   prazosin   1 mg Oral TID   sodium chloride  flush  3 mL Intravenous Q12H   Continuous Infusions:  sodium chloride       magnesium  sulfate bolus IVPB 2 g (10/09/23 1312)   potassium PHOSPHATE  IVPB (in mmol) 30 mmol (10/09/23 1009)     LOS: 0 days   Fredia Skeeter, MD [facility]  10/09/2023, 1:57 PM   *Please note that this is a verbal dictation therefore any spelling or grammatical errors are due to the Dragon Medical One system interpretation.  Please page via Amion and do not message via secure chat for urgent patient care matters. Secure chat can be used for non urgent patient care matters.  How to contact the [facility] Attending or Consulting provider 7A - 7P or covering provider during after hours 7P -7A, for this patient?  Check the care team in [facility] and look for a) attending/consulting [facility] provider listed and b) the [facility] team listed. Page or secure chat 7A-7P. Log into [email or web address] and use [facility] universal password to access. If you do not have the password, please contact the hospital operator. Locate the [facility] provider you are looking for under [facility] and page to a number that you can be directly reached. If you still have difficulty reaching the provider, please page the [facility] (Director on Call) for the Hospitalists listed on amion for assistance.

## 2023-10-10 DIAGNOSIS — R918 Other nonspecific abnormal finding of lung field: Secondary | ICD-10-CM | POA: Diagnosis not present

## 2023-10-10 DIAGNOSIS — J849 Interstitial pulmonary disease, unspecified: Secondary | ICD-10-CM | POA: Diagnosis not present

## 2023-10-10 DIAGNOSIS — J40 Bronchitis, not specified as acute or chronic: Secondary | ICD-10-CM | POA: Diagnosis not present

## 2023-10-10 LAB — CBC WITH DIFFERENTIAL/PLATELET
Abs Immature Granulocytes: 0.02 K/uL (ref 0.00–0.07)
Basophils Absolute: 0 K/uL (ref 0.0–0.1)
Basophils Relative: 0 %
Eosinophils Absolute: 0.1 K/uL (ref 0.0–0.5)
Eosinophils Relative: 2 %
HCT: 37.2 % (ref 36.0–46.0)
Hemoglobin: 11.6 g/dL — ABNORMAL LOW (ref 12.0–15.0)
Immature Granulocytes: 0 %
Lymphocytes Relative: 43 %
Lymphs Abs: 2.5 K/uL (ref 0.7–4.0)
MCH: 25.1 pg — ABNORMAL LOW (ref 26.0–34.0)
MCHC: 31.2 g/dL (ref 30.0–36.0)
MCV: 80.3 fL (ref 80.0–100.0)
Monocytes Absolute: 0.5 K/uL (ref 0.1–1.0)
Monocytes Relative: 8 %
Neutro Abs: 2.7 K/uL (ref 1.7–7.7)
Neutrophils Relative %: 47 %
Platelets: 207 K/uL (ref 150–400)
RBC: 4.63 MIL/uL (ref 3.87–5.11)
RDW: 14.6 % (ref 11.5–15.5)
WBC: 5.9 K/uL (ref 4.0–10.5)
nRBC: 0 % (ref 0.0–0.2)

## 2023-10-10 LAB — LEGIONELLA PNEUMOPHILA SEROGP 1 UR AG: L. pneumophila Serogp 1 Ur Ag: NEGATIVE

## 2023-10-10 LAB — BASIC METABOLIC PANEL WITH GFR
Anion gap: 10 (ref 5–15)
BUN: 8 mg/dL (ref 6–20)
CO2: 26 mmol/L (ref 22–32)
Calcium: 8.7 mg/dL — ABNORMAL LOW (ref 8.9–10.3)
Chloride: 101 mmol/L (ref 98–111)
Creatinine, Ser: 0.7 mg/dL (ref 0.44–1.00)
GFR, Estimated: >60 mL/min (ref >60)
Glucose, Bld: 102 mg/dL — ABNORMAL HIGH (ref 70–99)
Potassium: 4.2 mmol/L (ref 3.5–5.1)
Sodium: 137 mmol/L (ref 135–145)

## 2023-10-10 LAB — BRAIN NATRIURETIC PEPTIDE: B Natriuretic Peptide: 44 pg/mL (ref 0.0–100.0)

## 2023-10-10 MED ORDER — AZITHROMYCIN 250 MG PO TABS: 250.0000 mg | ORAL_TABLET | Freq: Every day | ORAL | 0 refills | Status: AC

## 2023-10-10 NOTE — TOC Transition Note (Signed)
 Transition of Care [facility] - Discharge Note   Patient Details  Name: Laura Rowland MRN: [id number] Date of Birth: 07-20-1975  Transition of Care [facility] CM/SW Contact:  Tom-Johnson, Michi Herrmann Daphne, RN Phone Number: 10/10/2023, 12:07 PM   Clinical Narrative:     Patient is scheduled for discharge today.  Outpatient f/u, hospital f/u and discharge instructions on AVS. Patient requested for a cab, Therapist, nutritional and Release of Liability form explained to patient with understanding verbalized, form signed and placed in patient's chart. Cab voucher given to RN. No further ICM needs noted.     Final next level of care: Home/Self Care Barriers to Discharge: Barriers Resolved   Patient Goals and CMS Choice Patient states their goals for this hospitalization and ongoing recovery are:: To return home CMS Medicare.gov Compare Post Acute Care list provided to:: Patient Choice offered to / list presented to : Patient      Discharge Placement                Patient to be transferred to facility by: [facility]      Discharge Plan and Services Additional resources added to the After Visit Summary for                  DME Arranged: N/A DME Agency: NA       HH Arranged: NA HH Agency: NA        Social Drivers of Health (SDOH) Interventions SDOH Screenings   Tobacco Use: Low Risk  (10/09/2023)     Readmission Risk Interventions     No data to display

## 2023-10-10 NOTE — Consult Note (Signed)
 NAME:  Laura Rowland, MRN:  [id number], DOB:  [birth year], LOS: 0 ADMISSION DATE:  10/08/2023, CONSULTATION DATE:  10/09/2023 REFERRING MD:  Sil[place]  CHIEF COMPLAINT:  Dyspnea, chest pain  History of Present Illness:  Laura Rowland is a 48 yo female with PMH of scleroderma, ILD 2/2 fibrosis, pulmonary hypertension, and immunocompromised status on CellCept  and Plaquenil .  Patient presented with worsening dyspnea and chest pain for 3 days.  She also endorses acute worsening of her chronic cough for the past 4 days.  No fevers.  Endorses brief rhinorrhea, some congestion.  Endorses fatigue for the past few days, worse than normal.  Denies leg swelling.  Patient was using daily Symbicort  maintenance inhaler.  The patient had 4 severe shortness of breath episodes requiring albuterol  inhaler use in the past 3 days.  The patient states she would use albuterol  inhaler for 2 puffs, wait 4 hours, then use it again and add DuoNeb treatment if still dyspneic.  She states this treatment helped mildly and temporarily, but dyspnea kept returning, which brought her to [facility] yesterday where ED evaluation was recommended.  Patient endorses consistently using her maintenance inhaler and immunosuppressive regimen, she is not missing any doses.  Patient states that at baseline she has no respiratory distress or dyspnea at rest.  With exertion (i.e. walking up steps), the patient experiences moderate dyspnea at baseline.  Patient has a chronic dry cough which has been going on for years.   Pertinent  Medical History  Scleroderma with Raynaud's phenomenon ILD   Significant Hospital Events: Including procedures, antibiotic start and stop dates in addition to other pertinent events   RPP 9/16 positive for rhino/enterovirus, s/p 1 dose Solumedrol 125 mg 9/17 - CTA chest slight interval worsening of interstitial fibrosis since 2022.  Chest pain has improved since admission with DuoNeb treatments.  Patient endorses that  chest pain occurs only after coughing spells, which occur when she exerts herself.  She states that breathing has improved after breathing treatments.   Interim History / Subjective:   9!8: cougjh much beter after symptomatic support. Her primary rehumatolgist cleared her for short course steroids but she feels much better  Objective    Blood pressure 138/81, pulse (!) 48, temperature 98 F (36.7 C), temperature source Oral, resp. rate 19, height 6' (1.829 m), weight 104.5 kg, SpO2 94%.        Intake/Output Summary (Last 24 hours) at 10/10/2023 0946 Last data filed at 10/10/2023 0857 Gross per 24 hour  Intake 1433.76 ml  Output 0 ml  Net 1433.76 ml   Filed Weights   10/08/23 0957 10/08/23 2357  Weight: 115 kg 104.5 kg   Examination: General: No distress. Lkooks well Neuro: Alert and Oriented x 3. GCS 15. Speech normal Psych: Pleasant Resp:  Barrel Chest - no.  Wheeze - no, Crackles - no, No overt respiratory distress CVS: Normal heart sounds. Murmurs - no Ext: Stigmata of Connective Tissue Disease - SCLERODERMA HEENT: Normal upper airway. PEERL +. No post nasal drip  Assessment and Plan   Scleroderma Interstitial fibrosis - Possible interval progression of fibrosis from prior CT 2022. - s/p 1 dose SoluMedrol  Plan  - repeat spiro and dlco -> see DR geronimo in ILD clinic (message sent)  - will need anti-fibrotic +/- clinical trial   Enlarged pulmonary artery on CT scan  - no evidence of PAH on ECHO or BNP  Plan - track PFTs and symptoms in opd ILD clinic  Rhinovirus URI Dyspnea,  cough  - cough improved  - no focal abnormalities  Plan  - Continue incentive spirometry - Continue DuoNebs Q4h PRN - Tussionex 5 mL Q8h, hold if drowsy - Chloraseptic lozenge PRN - Would recommend ambulatory pulse oximetry prior to discharge, ear probe or forehead probe - finish z pak - if cough worsens, she call and we can call in a 5 d prenisone   OK to dc from pccm  perspective     SIGNATURE    Dr. Dorethia Cave, M.D., F.C.C.P,  Pulmonary and Critical Care Medicine Staff Physician, [facility] Director - Interstitial Lung Disease  Program  Pulmonary Fibrosis [facility] at [facility] [place], [place], [address]   Pager: [phone], If no answer  -> Check AMION or Try [phone] Telephone (clinical office): [phone] Telephone (research): [phone]  9:51 AM 10/10/2023

## 2023-10-10 NOTE — Discharge Summary (Signed)
 Physician Discharge Summary  Laura Rowland FMW:[id number] DOB: 28-Dec-1975 DOA: 10/08/2023  PCP: Cleotilde Planas, MD  Admit date: 10/08/2023 Discharge date: 10/10/2023    Admitted From: Home Disposition: Home  Recommendations for Outpatient Follow-up:  Follow up with PCP in 1-2 weeks Please obtain BMP/CBC in one week Follow-up with your pulmonologist Dr. Geronimo, their office will call you with appointment time. Please follow up with your PCP on the following pending results: Unresulted Labs (From admission, onward)     Start     Ordered   10/10/23 0500  Allergens w/Total IgE Area 2  Tomorrow morning,   R       Question:  Specimen collection method  Answer:  Lab=Lab collect   10/09/23 1211   10/08/23 2353  Expectorated Sputum Assessment w Gram Stain, Rflx to Resp Cult  (COPD / Pneumonia / Cellulitis / Lower Extremity Wound (Diabetic Foot Infection))  Once,   R       Question Answer Comment  Patient immune status Immunocompromised   Release to patient Immediate      10/08/23 2353   10/08/23 2353  Legionella Pneumophila Serogp 1 Ur Ag  (COPD / Pneumonia / Cellulitis / Lower Extremity Wound (Diabetic Foot Infection))  Once,   R        10/08/23 2353   10/08/23 2027  Fungitell Beta-D-Glucan  Once,   R        10/08/23 2026   10/08/23 2026  Expectorated Sputum Assessment w Gram Stain, Rflx to Resp Cult  Once,   R       Question Answer Comment  Patient immune status Immunocompromised   Release to patient Immediate      10/08/23 2025              Home Health: None Equipment/Devices: None  Discharge Condition: Stable CODE STATUS: Full code Diet recommendation:  Diet Order             Diet Heart Room service appropriate? Yes; Fluid consistency: Thin  Diet effective now                   Subjective: Seen and examined, other than cough which is improving, patient has no complaints.  No shortness of breath.  She is comfortable going home today.  Brief/Interim Summary:  Laura Rowland is a 48 y.o. female with medical history significant of scleroderma, interstitial lung disease pulmonary hypertension, history of heart failure Presented from urgent care with worsening shortness of breath and cough for last 3 days with clear sputum. Patient is immunocompromise she takes CellCept  and Plaquenil .   Viral URI/scleroderma/interstitial fibrosis: Came in with respiratory symptoms, chest x-ray with increased conspicuity of patchy reticular opacities throughout bilateral lung which reflects worsening fibrosis but superimposed infection/inflammation is not excluded.  D-dimer elevated, CTA negative for PE however shows diffuse fibrotic changes.  Patient received Solu-Medrol  as well as IV antibiotics for possible pneumonia in the ED.  However she tested positive for rhinovirus.  But due to immunocompromised and weak lungs, she is at risk of bacterial superinfection so it is not unreasonable to continue antibiotics.  Pulmonary saw her, they needed antibiotics to azithromycin .  They did not start her on any steroids.  They discussed with patient's rheumatologist as well.  Patient is improved today, seen and cleared by pulmonology and they recommended completing Z-Pak course.  They will schedule follow-up in the clinic for repeat spiro and dlco -> see DR geronimo in [facility] clinic (message sent)  -  will need anti-fibrotic +/- clinical trial no evidence of PAH on ECHO or BNP  if cough worsens, she can call pulmonology office and we can call in a 5 d prenisone    Discharge Diagnoses:  Principal Problem:   Bronchitis Active Problems:   Scleroderma involving lung (HCC)   Dyspnea   Pulmonary arterial hypertension (HCC)   Rhinovirus infection   ILD (interstitial lung disease) (HCC)    Discharge Instructions   Allergies as of 10/10/2023   No Known Allergies      Medication List     TAKE these medications    albuterol  108 (90 Base) MCG/ACT inhaler Commonly known as: VENTOLIN   HFA Inhale 2 puffs into the lungs every 6 (six) hours as needed for wheezing or shortness of breath.   amLODipine 10 MG tablet Commonly known as: NORVASC Take 10 mg by mouth daily.   azithromycin  250 MG tablet Commonly known as: ZITHROMAX  Take 1 tablet (250 mg total) by mouth daily for 4 days. Start taking on: October 11, 2023   budesonide -formoterol  160-4.5 MCG/ACT inhaler Commonly known as: Symbicort  Inhale 2 puffs into the lungs 2 (two) times daily.   diclofenac Sodium 1 % Gel Commonly known as: VOLTAREN Apply 2 g topically daily as needed (for pain).   esomeprazole  40 MG capsule Commonly known as: NexIUM  Take 30- 60 min before your first and last meals of the day What changed:  how much to take how to take this when to take this   hydroxychloroquine  200 MG tablet Commonly known as: PLAQUENIL  Take 200 mg by mouth 2 (two) times daily.   ibuprofen  800 MG tablet Commonly known as: ADVIL  Take 800 mg by mouth every 8 (eight) hours as needed.   ipratropium-albuterol  0.5-2.5 (3) MG/3ML Soln Commonly known as: DUONEB Take 3 mLs by nebulization every 4 (four) hours as needed. What changed: reasons to take this   mycophenolate  500 MG tablet Commonly known as: CELLCEPT  Take 500 mg by mouth 2 (two) times daily.   NIFEdipine  30 MG 24 hr tablet Commonly known as: ADALAT  CC Take by mouth.   pentoxifylline  400 MG CR tablet Commonly known as: TRENTAL  Take 400 mg by mouth 2 (two) times daily.   phentermine 37.5 MG tablet Commonly known as: ADIPEX-P Take 37.5 mg by mouth daily before breakfast.   prazosin  1 MG capsule Commonly known as: MINIPRESS  Take 1 mg by mouth 2 (two) times daily.   Prenatal 27-1 MG Tabs Take 1 tablet by mouth daily.   valACYclovir 500 MG tablet Commonly known as: VALTREX Take 500 mg by mouth daily.   Vitamin D 50 MCG (2000 UT) tablet Take 2,000 Units by mouth every evening.   ZyrTEC Allergy 10 MG tablet Generic drug: cetirizine Take 10  mg by mouth daily.        Follow-up Information     Cleotilde Planas, MD Follow up in 1 week(s).   Specialty: Family Medicine Contact information: [address] [place] [address] [phone]                No Known Allergies  Consultations: Pulmonology   Procedures/Studies: ECHOCARDIOGRAM COMPLETE Result Date: 10/09/2023    ECHOCARDIOGRAM REPORT   Patient Name:   Laura Rowland Date of Exam: 10/09/2023 Medical Rec #:  [id number]      Height:       72.0 in Accession #:    [id number]     Weight:       230.4 lb Date of  Birth:  1975/11/29      BSA:          2.263 m Patient Age:    48 years       BP:           144/81 mmHg Patient Gender: F              HR:           82 bpm. Exam Location:  Inpatient Procedure: 2D Echo (Both Spectral and Color Flow Doppler were utilized during            procedure). Indications:    Dyspnea  History:        Patient has prior history of Echocardiogram examinations.                 Signs/Symptoms:Dyspnea.  Sonographer:    Norleen Amour Referring Phys: ANASTASSIA DOUTOVA IMPRESSIONS  1. Left ventricular ejection fraction, by estimation, is 60 to 65%. The left ventricle has normal function. The left ventricle has no regional wall motion abnormalities. Left ventricular diastolic parameters were normal.  2. Right ventricular systolic function is normal. The right ventricular size is normal.  3. The mitral valve is normal in structure. No evidence of mitral valve regurgitation. No evidence of mitral stenosis.  4. The aortic valve is tricuspid. Aortic valve regurgitation is trivial. No aortic stenosis is present.  5. The inferior vena cava is normal in size with <50% respiratory variability, suggesting right atrial pressure of 8 mmHg. FINDINGS  Left Ventricle: Left ventricular ejection fraction, by estimation, is 60 to 65%. The left ventricle has normal function. The left ventricle has no regional wall motion abnormalities. The left ventricular internal cavity  size was normal in size. There is  no left ventricular hypertrophy. Left ventricular diastolic parameters were normal. Normal left ventricular filling pressure. Right Ventricle: The right ventricular size is normal. No increase in right ventricular wall thickness. Right ventricular systolic function is normal. Left Atrium: Left atrial size was normal in size. Right Atrium: Right atrial size was normal in size. Pericardium: There is no evidence of pericardial effusion. Mitral Valve: The mitral valve is normal in structure. No evidence of mitral valve regurgitation. No evidence of mitral valve stenosis. Tricuspid Valve: The tricuspid valve is normal in structure. Tricuspid valve regurgitation is not demonstrated. No evidence of tricuspid stenosis. Aortic Valve: The aortic valve is tricuspid. Aortic valve regurgitation is trivial. No aortic stenosis is present. Pulmonic Valve: The pulmonic valve was normal in structure. Pulmonic valve regurgitation is not visualized. No evidence of pulmonic stenosis. Aorta: The aortic root is normal in size and structure. Venous: The inferior vena cava is normal in size with less than 50% respiratory variability, suggesting right atrial pressure of 8 mmHg. IAS/Shunts: No atrial level shunt detected by color flow Doppler.  LEFT VENTRICLE PLAX 2D LVIDd:         4.40 cm      Diastology LVIDs:         2.80 cm      LV e' medial:    10.80 cm/s LV PW:         0.90 cm      LV E/e' medial:  8.8 LV IVS:        1.00 cm      LV e' lateral:   13.80 cm/s LVOT diam:     2.20 cm      LV E/e' lateral: 6.8 LV SV:  92 LV SV Index:   41 LVOT Area:     3.80 cm  LV Volumes (MOD) LV vol d, MOD A2C: 120.0 ml LV vol d, MOD A4C: 91.3 ml LV vol s, MOD A2C: 41.7 ml LV vol s, MOD A4C: 28.9 ml LV SV MOD A2C:     78.3 ml LV SV MOD A4C:     91.3 ml LV SV MOD BP:      74.3 ml RIGHT VENTRICLE             IVC RV Basal diam:  3.80 cm     IVC diam: 2.00 cm RV S prime:     14.10 cm/s TAPSE (M-mode): 2.5 cm LEFT  ATRIUM             Index        RIGHT ATRIUM           Index LA diam:        3.30 cm 1.46 cm/m   RA Area:     13.70 cm LA Vol (A2C):   44.1 ml 19.49 ml/m  RA Volume:   33.20 ml  14.67 ml/m LA Vol (A4C):   61.3 ml 27.09 ml/m LA Biplane Vol: 56.2 ml 24.84 ml/m  AORTIC VALVE             PULMONIC VALVE LVOT Vmax:   149.00 cm/s PV Vmax:       0.96 m/s LVOT Vmean:  95.900 cm/s PV Peak grad:  3.7 mmHg LVOT VTI:    0.242 m  AORTA Ao Root diam: 2.90 cm Ao Asc diam:  2.70 cm MITRAL VALVE MV Area (PHT): 3.56 cm    SHUNTS MV Decel Time: 213 msec    Systemic VTI:  0.24 m MV E velocity: 94.50 cm/s  Systemic Diam: 2.20 cm MV A velocity: 77.90 cm/s MV E/A ratio:  1.21 Annabella Scarce MD Electronically signed by Annabella Scarce MD Signature Date/Time: 10/09/2023/4:14:19 PM    Final    CT Angio Chest PE W and/or Wo Contrast Result Date: 10/08/2023 CLINICAL DATA:  Shortness of breath for several days EXAM: CT ANGIOGRAPHY CHEST WITH CONTRAST TECHNIQUE: Multidetector CT imaging of the chest was performed using the standard protocol during bolus administration of intravenous contrast. Multiplanar CT image reconstructions and MIPs were obtained to evaluate the vascular anatomy. RADIATION DOSE REDUCTION: This exam was performed according to the departmental dose-optimization program which includes automated exposure control, adjustment of the mA and/or kV according to patient size and/or use of iterative reconstruction technique. CONTRAST:  75mL OMNIPAQUE  IOHEXOL  350 MG/ML SOLN COMPARISON:  Chest x-ray from earlier in the same day. FINDINGS: Cardiovascular: Thoracic aorta shows no aneurysmal dilatation or dissection. The pulmonary artery shows a normal branching pattern bilaterally. No intraluminal filling defect to suggest pulmonary embolism is seen. Heart is not significantly enlarged in size. No significant coronary calcifications are noted. The pulmonary arterial trunk is mildly prominent which may represent some pulmonary  arterial hypertension. Mediastinum/Nodes: Thoracic inlet is within normal limits. No hilar or mediastinal adenopathy is noted. The esophagus as visualized is within normal limits. Lungs/Pleura: Lungs are well aerated bilaterally. Subpleural fibrotic changes are again identified bilaterally worse in the bases. Scattered parenchymal nodules are noted particularly within the fibrotic changes which overall are stable in appearance. There are some nodules which demonstrates some enlargement when compared with the prior exam. Particularly in the bases. A 10 mm nodule is noted on image number 67 of series 7 slightly increased from the prior  exam at which time it measured 8 mm. 8 mm nodule in the left lower lobe posteriorly is noted on image number 78 of series 7 which has increased in the interval from the prior exam at which time it measured 7 mm. Some more confluence of some of the smaller nodules is noted when compared with the prior exam suggesting enlargement although likely related to the interval confluence. Upper Abdomen: Visualized upper abdomen shows evidence of cholelithiasis. No complicating factors are noted. Musculoskeletal: No chest wall abnormality. No acute or significant osseous findings. Review of the MIP images confirms the above findings. IMPRESSION: No evidence of pulmonary embolism. Some findings suggestive of pulmonary arterial hypertension are noted. Diffuse fibrotic changes particularly in the bases bilaterally. Associated nodularity is seen which is slightly increased in the interval from 2022 felt to be progression of scleroderma related fibrosis and nodular change. Given the increase in size from the prior exam, follow-up examination in 6 months is recommended to assess for stability. Cholelithiasis Electronically Signed   By: Oneil Devonshire M.D.   On: 10/08/2023 19:44   DG Chest 2 View Result Date: 10/08/2023 CLINICAL DATA:  Three day history of shortness of breath EXAM: CHEST - 2 VIEW  COMPARISON:  Chest radiograph dated 02/08/2023 FINDINGS: Unchanged asymmetric elevation of the right hemidiaphragm. Normal lung volumes. Increased conspicuity patchy reticular opacities throughout the right lung and left lower lung. Blunting of right costophrenic angle. No pneumothorax. The heart size and mediastinal contours are within normal limits. No acute osseous abnormality. IMPRESSION: 1. Increased conspicuity of patchy reticular opacities throughout the right lung and left lower lung, which may reflect worsening fibrosis. Superimposed infection/inflammation is not excluded. 2. Blunting of the right costophrenic angle, which may represent a small pleural effusion. Electronically Signed   By: Limin  Xu M.D.   On: 10/08/2023 10:51     Discharge Exam: Vitals:   10/10/23 0502 10/10/23 0847  BP: 123/70 138/81  Pulse: 72 (!) 48  Resp: 17 19  Temp: 97.9 F (36.6 C) 98 F (36.7 C)  SpO2: 100% 94%   Vitals:   10/09/23 1706 10/09/23 2131 10/10/23 0502 10/10/23 0847  BP: (!) 146/74 (!) 142/80 123/70 138/81  Pulse: 82 95 72 (!) 48  Resp: 18 19 17 19   Temp: 97.9 F (36.6 C) (!) 97.5 F (36.4 C) 97.9 F (36.6 C) 98 F (36.7 C)  TempSrc: Oral Oral Oral Oral  SpO2:  98% 100% 94%  Weight:      Height:        General: Pt is alert, awake, not in acute distress Cardiovascular: RRR, S1/S2 +, no rubs, no gallops Respiratory:  no wheezing, mild rhonchi Abdominal: Soft, NT, ND, bowel sounds + Extremities: no edema, no cyanosis    The results of significant diagnostics from this hospitalization (including imaging, microbiology, ancillary and laboratory) are listed below for reference.     Microbiology: Recent Results (from the past 240 hours)  Resp panel by RT-PCR (RSV, Flu A&B, Covid) Anterior Nasal Swab     Status: None   Collection Time: 10/08/23 10:22 AM   Specimen: Anterior Nasal Swab  Result Value Ref Range Status   SARS Coronavirus 2 by RT PCR NEGATIVE NEGATIVE Final   Influenza  A by PCR NEGATIVE NEGATIVE Final   Influenza B by PCR NEGATIVE NEGATIVE Final    Comment: (NOTE) The Xpert Xpress SARS-CoV-2/FLU/RSV plus assay is intended as an aid in the diagnosis of influenza from Nasopharyngeal swab specimens and should not be  used as a sole basis for treatment. Nasal washings and aspirates are unacceptable for Xpert Xpress SARS-CoV-2/FLU/RSV testing.  Fact Sheet for Patients: BloggerCourse.com  Fact Sheet for Healthcare Providers: SeriousBroker.it  This test is not yet approved or cleared by the [place]  FDA and has been authorized for detection and/or diagnosis of SARS-CoV-2 by FDA under an Emergency Use Authorization (EUA). This EUA will remain in effect (meaning this test can be used) for the duration of the COVID-19 declaration under Section 564(b)(1) of the Act, 21 U.S.C. section 360bbb-3(b)(1), unless the authorization is terminated or revoked.     Resp Syncytial Virus by PCR NEGATIVE NEGATIVE Final    Comment: (NOTE) Fact Sheet for Patients: BloggerCourse.com  Fact Sheet for Healthcare Providers: SeriousBroker.it  This test is not yet approved or cleared by the [place]  FDA and has been authorized for detection and/or diagnosis of SARS-CoV-2 by FDA under an Emergency Use Authorization (EUA). This EUA will remain in effect (meaning this test can be used) for the duration of the COVID-19 declaration under Section 564(b)(1) of the Act, 21 U.S.C. section 360bbb-3(b)(1), unless the authorization is terminated or revoked.  Performed at [facility], [address] [place], [place] [address]   Culture, blood (routine x 2)     Status: None (Preliminary result)   Collection Time: 10/08/23  6:14 PM   Specimen: BLOOD  Result Value Ref Range Status   Specimen Description BLOOD LEFT ANTECUBITAL  Final   Special Requests   Final    BOTTLES  DRAWN AEROBIC AND ANAEROBIC Blood Culture results may not be optimal due to an inadequate volume of blood received in culture bottles   Culture   Final    NO GROWTH 2 DAYS Performed at [facility], [address], [place], [place] [address]    Report Status PENDING  Incomplete  Culture, blood (routine x 2)     Status: None (Preliminary result)   Collection Time: 10/08/23  6:16 PM   Specimen: BLOOD RIGHT WRIST  Result Value Ref Range Status   Specimen Description BLOOD RIGHT WRIST  Final   Special Requests   Final    BOTTLES DRAWN AEROBIC AND ANAEROBIC Blood Culture results may not be optimal due to an inadequate volume of blood received in culture bottles   Culture   Final    NO GROWTH 2 DAYS Performed at [facility], [address] [place], [place] [address]    Report Status PENDING  Incomplete  Respiratory (~20 pathogens) panel by PCR     Status: Abnormal   Collection Time: 10/08/23  8:26 PM   Specimen: Nasopharyngeal Swab; Respiratory  Result Value Ref Range Status   Adenovirus NOT DETECTED NOT DETECTED Final   Coronavirus 229E NOT DETECTED NOT DETECTED Final    Comment: (NOTE) The Coronavirus on the Respiratory Panel, DOES NOT test for the novel  Coronavirus (2019 nCoV)    Coronavirus HKU1 NOT DETECTED NOT DETECTED Final   Coronavirus NL63 NOT DETECTED NOT DETECTED Final   Coronavirus OC43 NOT DETECTED NOT DETECTED Final   Metapneumovirus NOT DETECTED NOT DETECTED Final   Rhinovirus / Enterovirus DETECTED (A) NOT DETECTED Final   Influenza A NOT DETECTED NOT DETECTED Final   Influenza B NOT DETECTED NOT DETECTED Final   Parainfluenza Virus 1 NOT DETECTED NOT DETECTED Final   Parainfluenza Virus 2 NOT DETECTED NOT DETECTED Final   Parainfluenza Virus 3 NOT DETECTED NOT DETECTED Final   Parainfluenza Virus 4  NOT DETECTED NOT DETECTED Final   Respiratory Syncytial Virus NOT DETECTED NOT DETECTED Final   Bordetella pertussis NOT DETECTED NOT DETECTED Final    Bordetella Parapertussis NOT DETECTED NOT DETECTED Final   Chlamydophila pneumoniae NOT DETECTED NOT DETECTED Final   Mycoplasma pneumoniae NOT DETECTED NOT DETECTED Final    Comment: Performed at [facility], [address], [place], [place] [address]     Labs: BNP (last 3 results) Recent Labs    10/10/23 0359  BNP 44.0   Basic Metabolic Panel: Recent Labs  Lab 10/08/23 1123 10/08/23 2219 10/09/23 0021 10/10/23 0359  NA 137  --  138 137  K 4.3  --  3.8 4.2  CL 106  --  104 101  CO2 23  --  24 26  GLUCOSE 101*  --  133* 102*  BUN 8  --  6 8  CREATININE 0.78  --  0.70 0.70  CALCIUM  8.7*  --  8.9 8.7*  MG  --  2.1 1.9  --   PHOS  --  2.0* 2.4*  --    Liver Function Tests: Recent Labs  Lab 10/08/23 1123 10/09/23 0021  AST 22 21  ALT 13 12  ALKPHOS 52 63  BILITOT <0.2 0.5  PROT 7.1 7.6  ALBUMIN 3.4* 3.6   No results for input(s): LIPASE, AMYLASE in the last 168 hours. No results for input(s): AMMONIA in the last 168 hours. CBC: Recent Labs  Lab 10/08/23 1123 10/09/23 0021 10/10/23 0359  WBC 5.2 5.1 5.9  NEUTROABS  --   --  2.7  HGB 12.0 13.0 11.6*  HCT 38.6 41.3 37.2  MCV 82.0 80.0 80.3  PLT 220 241 207   Cardiac Enzymes: Recent Labs  Lab 10/08/23 2219  CKTOTAL 147   BNP: Invalid input(s): POCBNP CBG: Recent Labs  Lab 10/08/23 1012  GLUCAP 109*   D-Dimer No results for input(s): DDIMER in the last 72 hours. Hgb A1c No results for input(s): HGBA1C in the last 72 hours. Lipid Profile No results for input(s): CHOL, HDL, LDLCALC, TRIG, CHOLHDL, LDLDIRECT in the last 72 hours. Thyroid  function studies No results for input(s): TSH, T4TOTAL, T3FREE, THYROIDAB in the last 72 hours.  Invalid input(s): FREET3 Anemia work up No results for input(s): VITAMINB12, FOLATE, FERRITIN, TIBC, IRON, RETICCTPCT in the last 72 hours. Urinalysis    Component Value Date/Time   COLORURINE YELLOW 10/08/2023  1052   APPEARANCEUR HAZY (A) 10/08/2023 1052   LABSPEC 1.015 10/08/2023 1052   PHURINE 5.0 10/08/2023 1052   GLUCOSEU NEGATIVE 10/08/2023 1052   HGBUR NEGATIVE 10/08/2023 1052   BILIRUBINUR NEGATIVE 10/08/2023 1052   KETONESUR NEGATIVE 10/08/2023 1052   PROTEINUR NEGATIVE 10/08/2023 1052   NITRITE NEGATIVE 10/08/2023 1052   LEUKOCYTESUR TRACE (A) 10/08/2023 1052   Sepsis Labs Recent Labs  Lab 10/08/23 1123 10/09/23 0021 10/10/23 0359  WBC 5.2 5.1 5.9   Microbiology Recent Results (from the past 240 hours)  Resp panel by RT-PCR (RSV, Flu A&B, Covid) Anterior Nasal Swab     Status: None   Collection Time: 10/08/23 10:22 AM   Specimen: Anterior Nasal Swab  Result Value Ref Range Status   SARS Coronavirus 2 by RT PCR NEGATIVE NEGATIVE Final   Influenza A by PCR NEGATIVE NEGATIVE Final   Influenza B by PCR NEGATIVE NEGATIVE Final    Comment: (NOTE) The Xpert Xpress SARS-CoV-2/FLU/RSV plus assay is intended as an aid in the diagnosis of influenza from Nasopharyngeal swab specimens and  should not be used as a sole basis for treatment. Nasal washings and aspirates are unacceptable for Xpert Xpress SARS-CoV-2/FLU/RSV testing.  Fact Sheet for Patients: BloggerCourse.com  Fact Sheet for Healthcare Providers: SeriousBroker.it  This test is not yet approved or cleared by the [place]  FDA and has been authorized for detection and/or diagnosis of SARS-CoV-2 by FDA under an Emergency Use Authorization (EUA). This EUA will remain in effect (meaning this test can be used) for the duration of the COVID-19 declaration under Section 564(b)(1) of the Act, 21 U.S.C. section 360bbb-3(b)(1), unless the authorization is terminated or revoked.     Resp Syncytial Virus by PCR NEGATIVE NEGATIVE Final    Comment: (NOTE) Fact Sheet for Patients: BloggerCourse.com  Fact Sheet for Healthcare  Providers: SeriousBroker.it  This test is not yet approved or cleared by the [place]  FDA and has been authorized for detection and/or diagnosis of SARS-CoV-2 by FDA under an Emergency Use Authorization (EUA). This EUA will remain in effect (meaning this test can be used) for the duration of the COVID-19 declaration under Section 564(b)(1) of the Act, 21 U.S.C. section 360bbb-3(b)(1), unless the authorization is terminated or revoked.  Performed at [facility], [address] [place], [place] [address]   Culture, blood (routine x 2)     Status: None (Preliminary result)   Collection Time: 10/08/23  6:14 PM   Specimen: BLOOD  Result Value Ref Range Status   Specimen Description BLOOD LEFT ANTECUBITAL  Final   Special Requests   Final    BOTTLES DRAWN AEROBIC AND ANAEROBIC Blood Culture results may not be optimal due to an inadequate volume of blood received in culture bottles   Culture   Final    NO GROWTH 2 DAYS Performed at [facility], [address], [place], [place] [address]    Report Status PENDING  Incomplete  Culture, blood (routine x 2)     Status: None (Preliminary result)   Collection Time: 10/08/23  6:16 PM   Specimen: BLOOD RIGHT WRIST  Result Value Ref Range Status   Specimen Description BLOOD RIGHT WRIST  Final   Special Requests   Final    BOTTLES DRAWN AEROBIC AND ANAEROBIC Blood Culture results may not be optimal due to an inadequate volume of blood received in culture bottles   Culture   Final    NO GROWTH 2 DAYS Performed at [facility], [address], [place], [place] [address]    Report Status PENDING  Incomplete  Respiratory (~20 pathogens) panel by PCR     Status: Abnormal   Collection Time: 10/08/23  8:26 PM   Specimen: Nasopharyngeal Swab; Respiratory  Result Value Ref Range Status   Adenovirus NOT DETECTED NOT DETECTED Final   Coronavirus 229E NOT DETECTED NOT DETECTED Final    Comment:  (NOTE) The Coronavirus on the Respiratory Panel, DOES NOT test for the novel  Coronavirus (2019 nCoV)    Coronavirus HKU1 NOT DETECTED NOT DETECTED Final   Coronavirus NL63 NOT DETECTED NOT DETECTED Final   Coronavirus OC43 NOT DETECTED NOT DETECTED Final   Metapneumovirus NOT DETECTED NOT DETECTED Final   Rhinovirus / Enterovirus DETECTED (A) NOT DETECTED Final   Influenza A NOT DETECTED NOT DETECTED Final   Influenza B NOT DETECTED NOT DETECTED Final   Parainfluenza Virus 1 NOT DETECTED NOT DETECTED Final   Parainfluenza Virus 2 NOT DETECTED NOT DETECTED Final   Parainfluenza Virus 3 NOT DETECTED NOT DETECTED Final  Parainfluenza Virus 4 NOT DETECTED NOT DETECTED Final   Respiratory Syncytial Virus NOT DETECTED NOT DETECTED Final   Bordetella pertussis NOT DETECTED NOT DETECTED Final   Bordetella Parapertussis NOT DETECTED NOT DETECTED Final   Chlamydophila pneumoniae NOT DETECTED NOT DETECTED Final   Mycoplasma pneumoniae NOT DETECTED NOT DETECTED Final    Comment: Performed at [facility], [address], [place], [place] [address]    FURTHER DISCHARGE INSTRUCTIONS:   Get Medicines reviewed and adjusted: Please take all your medications with you for your next visit with your Primary MD   Laboratory/radiological data: Please request your Primary MD to go over all hospital tests and procedure/radiological results at the follow up, please ask your Primary MD to get all Hospital records sent to his/her office.   In some cases, they will be blood work, cultures and biopsy results pending at the time of your discharge. Please request that your primary care M.D. goes through all the records of your hospital data and follows up on these results.   Also Note the following: If you experience worsening of your admission symptoms, develop shortness of breath, life threatening emergency, suicidal or homicidal thoughts you must seek medical attention immediately by calling 911 or  calling your MD immediately  if symptoms less severe.   You must read complete instructions/literature along with all the possible adverse reactions/side effects for all the Medicines you take and that have been prescribed to you. Take any new Medicines after you have completely understood and accpet all the possible adverse reactions/side effects.    patient was instructed, not to drive, operate heavy machinery, perform activities at heights, swimming or participation in water activities or provide baby-sitting services while on Pain, Sleep and Anxiety Medications; until their outpatient Physician has advised to do so again. Also recommended to not to take more than prescribed Pain, Sleep and Anxiety Medications.  It is not advisable to combine anxiety, sleep and pain medications without talking with your primary care provider.     Wear Seat belts while driving.   Please note: You were cared for by a hospitalist during your hospital stay. Once you are discharged, your primary care physician will handle any further medical issues. Please note that NO REFILLS for any discharge medications will be authorized once you are discharged, as it is imperative that you return to your primary care physician (or establish a relationship with a primary care physician if you do not have one) for your post hospital discharge needs so that they can reassess your need for medications and monitor your lab values  Time coordinating discharge: Over 30 minutes  SIGNED:   Fredia Skeeter, MD  [facility] 10/10/2023, 11:35 AM *Please note that this is a verbal dictation therefore any spelling or grammatical errors are due to the Dragon Medical One system interpretation. If 7PM-7AM, please contact night-coverage [email or web address]

## 2023-10-10 NOTE — Progress Notes (Signed)
 DISCHARGE NOTE HOME Laura Rowland to be discharged Home per MD order. Discussed prescriptions and follow up appointments with the patient. Prescriptions given to patient; medication list explained in detail. Patient verbalized understanding.  Skin clean, dry and intact without evidence of skin break down, no evidence of skin tears noted. IV catheter discontinued intact. Site without signs and symptoms of complications. Dressing and pressure applied. Pt denies pain at the site currently. No complaints noted.  Patient free of lines, drains, and wounds.   An After Visit Summary (AVS) was printed and given to the patient. Patient escorted via wheelchair, and discharged home via private auto.  Doyal Sias, RN

## 2023-10-12 LAB — ALLERGENS W/TOTAL IGE AREA 2
Alternaria Alternata IgE: <0.1 kU/L
Aspergillus Fumigatus IgE: <0.1 kU/L
Bermuda Grass IgE: <0.1 kU/L
Cat Dander IgE: <0.1 kU/L
Cedar, Mountain IgE: <0.1 kU/L
Cladosporium Herbarum IgE: <0.1 kU/L
Cockroach, German IgE: <0.1 kU/L
Common Silver Birch IgE: <0.1 kU/L
Cottonwood IgE: <0.1 kU/L
D Farinae IgE: <0.1 kU/L
D Pteronyssinus IgE: <0.1 kU/L
Dog Dander IgE: <0.1 kU/L
Elm, American IgE: <0.1 kU/L
IgE (Immunoglobulin E), Serum: <2 [IU]/mL — ABNORMAL LOW (ref 6–495)
Johnson Grass IgE: <0.1 kU/L
Maple/Box Elder IgE: <0.1 kU/L
Mouse Urine IgE: <0.1 kU/L
Oak, White IgE: <0.1 kU/L
Pecan, Hickory IgE: <0.1 kU/L
Penicillium Chrysogen IgE: <0.1 kU/L
Pigweed, Rough IgE: <0.1 kU/L
Ragweed, Short IgE: <0.1 kU/L
Sheep Sorrel IgE Qn: <0.1 kU/L
Timothy Grass IgE: <0.1 kU/L
White Mulberry IgE: <0.1 kU/L

## 2023-10-13 LAB — CULTURE, BLOOD (ROUTINE X 2)
Culture: NO GROWTH
Culture: NO GROWTH

## 2023-10-14 ENCOUNTER — Ambulatory Visit: Payer: Self-pay | Admitting: Internal Medicine

## 2023-10-14 DIAGNOSIS — J40 Bronchitis, not specified as acute or chronic: Secondary | ICD-10-CM | POA: Diagnosis not present

## 2023-10-14 DIAGNOSIS — B348 Other viral infections of unspecified site: Secondary | ICD-10-CM | POA: Diagnosis not present

## 2023-10-14 DIAGNOSIS — M349 Systemic sclerosis, unspecified: Secondary | ICD-10-CM | POA: Diagnosis not present

## 2023-10-14 DIAGNOSIS — Z23 Encounter for immunization: Secondary | ICD-10-CM | POA: Diagnosis not present

## 2023-10-14 LAB — FUNGITELL BETA-D-GLUCAN: Fungitell Value:: <31.25 pg/mL

## 2023-10-22 DIAGNOSIS — J45901 Unspecified asthma with (acute) exacerbation: Secondary | ICD-10-CM | POA: Diagnosis not present

## 2023-10-22 DIAGNOSIS — J4530 Mild persistent asthma, uncomplicated: Secondary | ICD-10-CM | POA: Diagnosis not present

## 2023-10-31 DIAGNOSIS — R7303 Prediabetes: Secondary | ICD-10-CM | POA: Diagnosis not present

## 2023-10-31 DIAGNOSIS — I700 Atherosclerosis of aorta: Secondary | ICD-10-CM | POA: Diagnosis not present

## 2023-10-31 DIAGNOSIS — G479 Sleep disorder, unspecified: Secondary | ICD-10-CM | POA: Diagnosis not present

## 2023-11-05 ENCOUNTER — Ambulatory Visit (HOSPITAL_COMMUNITY)
Admission: RE | Admit: 2023-11-05 | Discharge: 2023-11-05 | Disposition: A | Discharge status: Home / Self Care | Source: Ambulatory Visit | Attending: Internal Medicine | Admitting: Internal Medicine

## 2023-11-05 DIAGNOSIS — J8489 Other specified interstitial pulmonary diseases: Secondary | ICD-10-CM | POA: Diagnosis not present

## 2023-11-05 DIAGNOSIS — M359 Systemic involvement of connective tissue, unspecified: Secondary | ICD-10-CM | POA: Insufficient documentation

## 2023-11-05 LAB — PULMONARY FUNCTION TEST
DL/VA % pred: 77 %
DL/VA: 3.19 ml/min/mmHg/L
DLCO unc % pred: 47 %
DLCO unc: 12.99 ml/min/mmHg
FEF 25-75 Pre: 1.01 L/s
FEF2575-%Pred-Pre: 29 %
FEV1-%Pred-Pre: 50 %
FEV1-Pre: 1.86 L
FEV1FVC-%Pred-Pre: 79 %
FEV6-%Pred-Pre: 63 %
FEV6-Pre: 2.86 L
FEV6FVC-%Pred-Pre: 102 %
FVC-%Pred-Pre: 61 %
FVC-Pre: 2.87 L
Pre FEV1/FVC ratio: 65 %
Pre FEV6/FVC Ratio: 100 %

## 2023-11-07 ENCOUNTER — Encounter: Payer: Self-pay | Admitting: Internal Medicine

## 2023-11-07 ENCOUNTER — Ambulatory Visit (INDEPENDENT_AMBULATORY_CARE_PROVIDER_SITE_OTHER): Admitting: Internal Medicine

## 2023-11-07 VITALS — BP 118/78 | Ht 72.0 in | Wt 231.6 lb

## 2023-11-07 DIAGNOSIS — L989 Disorder of the skin and subcutaneous tissue, unspecified: Secondary | ICD-10-CM | POA: Diagnosis not present

## 2023-11-07 DIAGNOSIS — M359 Systemic involvement of connective tissue, unspecified: Secondary | ICD-10-CM | POA: Diagnosis not present

## 2023-11-07 DIAGNOSIS — J8489 Other specified interstitial pulmonary diseases: Secondary | ICD-10-CM | POA: Diagnosis not present

## 2023-11-07 DIAGNOSIS — J849 Interstitial pulmonary disease, unspecified: Secondary | ICD-10-CM

## 2023-11-07 DIAGNOSIS — Z79899 Other long term (current) drug therapy: Secondary | ICD-10-CM | POA: Diagnosis not present

## 2023-11-07 DIAGNOSIS — Z79624 Long term (current) use of inhibitors of nucleotide synthesis: Secondary | ICD-10-CM | POA: Diagnosis not present

## 2023-11-07 DIAGNOSIS — Z09 Encounter for follow-up examination after completed treatment for conditions other than malignant neoplasm: Secondary | ICD-10-CM

## 2023-11-07 DIAGNOSIS — J4530 Mild persistent asthma, uncomplicated: Secondary | ICD-10-CM

## 2023-11-07 DIAGNOSIS — T505X5A Adverse effect of appetite depressants, initial encounter: Secondary | ICD-10-CM

## 2023-11-07 NOTE — Patient Instructions (Addendum)
 ICD-10-CM   1. Interstitial lung disease due to connective tissue disease (HCC)  J84.89    M35.9     2. ILD (interstitial lung disease) (HCC)  J84.9     3. High risk medication use  Z79.899     4. On Cellcept  therapy  Z79.624     5. Exposure to phentermine, initial encounter  T50.5X5A     6. Hospital discharge follow-up  Z09       #ILD  Based on the pulmonary function test there might be very slow progression of interstitial lung disease slowly over time over many years..    Plan - 3 months to spirometry and DLCO - 3 months do high-resolution CT chest - Continue CellCept  500 mg twice daily - Based on the results of the follow-up test we will consider Nerandomilast antifibrotic   Phentermine exposure x 3 months  - Phentermine associated with some risk of getting pulmonary hypertension.  There is also risk in getting pulmonary hypertension because of his scleroderma.There is also possibility that the decline in diffusion capacity could be because of pulmonary hypertension but the normal echo and normal BNP in September 2025 suggests a low possibility only.  Plan - Stop phentermine but inform your primary care physician   Follow-up - 3 months with Dr. Geronimo 15-minute visit; ILD symptom score at follow-up

## 2023-11-07 NOTE — Progress Notes (Signed)
 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   Patient presented with worsening dyspnea and chest pain for 3 days.  She also endorses acute worsening of her chronic cough for the past 4 days.  No fevers.  Endorses brief rhinorrhea, some congestion.  Endorses fatigue for the past few days, worse than normal.  Denies leg swelling.   Patient was using daily Symbicort  maintenance inhaler.  The patient had 4 severe shortness of breath episodes requiring albuterol  inhaler use in the past 3 days.  The patient states she would use albuterol  inhaler for 2 puffs, wait 4 hours, then use it again and add DuoNeb treatment if still dyspneic.  She states this treatment helped mildly and temporarily, but dyspnea kept returning, which brought her to [facility] where ED evaluation was recommended.   Patient endorses consistently using her maintenance inhaler and immunosuppressive regimen, she is not missing any doses.   Patient states that at baseline she has no respiratory distress or dyspnea at rest.  With exertion (i.e. walking up steps), the patient experiences moderate dyspnea at baseline.  Patient has a chronic dry cough which has been going on for years.   RPP 9/16 positive for rhino/enterovirus, s/p 1 dose Solumedrol 125 mg 9/17 - CTA chest slight interval worsening of interstitial fibrosis since 2022.  Chest pain has improved since admission with DuoNeb treatments.  Patient endorses that chest pain occurs only after coughing spells, which occur when she exerts herself.  She states that breathing has improved after breathing treatments.  Scleroderma Interstitial fibrosis - Possible interval progression of fibrosis from prior CT 2022. - s/p 1 dose SoluMedrol   Plan  - repeat spiro and dlco -> see DR geronimo in ILD clinic (message sent)  - will  need anti-fibrotic +/- clinical trial     Enlarged pulmonary artery on CT scan   - no evidence of PAH on ECHO or BNP   Plan - track PFTs and symptoms in opd ILD clinic   Rhinovirus URI Dyspnea, cough   - cough improved  - no focal abnormalities   Plan    OV 11/07/2023  Subjective:  Patient ID: Laura Rowland, female , DOB: 1975/09/04 , age 48 y.o. , MRN: [id number] , ADDRESS: [address] [name] PCP Cleotilde Planas, MD Patient Care Team: Cleotilde Planas, MD as PCP - General (Family Medicine)  This Provider for this visit: Treatment Team:  Attending Provider: geronimo Amel, MD    11/07/2023 -   Chief Complaint  Patient presents with   Medical Management of Chronic Issues   Interstitial Lung Disease    Breathing is at baseline. She has some non prod cough.     # Interstitial lung disease due to scleroderma  #CellCept  500 mg twice daily  # At risk for pulmonary hypertension [scleroderma and phentermine as of October 2025]  - Normal echo and BNP September 2025  #Hospitalization for rhinovirus September 2025   HPI Laura Rowland 48 y.o. -returns for posthospital follow-up.  She quickly recovered from a rhinovirus infection.  In the hospital did a blood BNP and echocardiogram and this suggested low risk of pulmonary hypertension.  Currently feels back to baseline.  Symptom scores are below.  She continues her CellCept  500 mg twice daily med review shows that she is on phentermine for weight loss.  Has been taking this for 3 months.  She is unaware that this might be associated pulmonary hypertension risk.  She had spirometry and DLCO which is somewhat stable compared to recent times but definitely progressive over some years.  The CT angiogram in the hospital suggested worsening pulmonary process over the years with contrast CT.  Discussed the new approval of Nerandomilast for progressive phenotype of pulmonary fibrosis she is interested in this.   Explained the GI side effects.  Will see her in 3 months with a spirometry and DLCO and CT scan and then decide if it is indicated  Also regarding pulmonary hypertension she is already at risk because of scleroderma.  Therefore I told her to stop her phentermine.  She is up-to-date with her flu shot  SYMPTOM SCALE - ILD 11/07/2023  Current weight   O2 use ra  Shortness of Breath 0 -> 5 scale with 5 being worst (score 6 If unable to do)  At rest 0  Simple tasks - showers, clothes change, eating, shaving 1  Household (dishes, doing bed, laundry) 2  Shopping 2  Walking level at own pace 2  Walking up Stairs 3  Total (30-36) Dyspnea Score 12  How bad is your cough? 1  How bad is your fatigue 3  How bad is appetiee 3  How bad is nausea 1  How bad is vomiting?  1  How bad is diarrhea? 0  How bad is anxiety? 0  How bad is depression 0  Any chronic pain - if so where and how bad 0      PFT     Latest Ref Rng & Units 10/09/2023   11:35 AM 09/27/2022    2:28 PM 03/22/2021    3:05 PM 04/26/2020    3:23 PM 11/18/2019   10:05 AM 03/12/2019    8:30 AM 10/02/2017    3:07 PM  PFT Results  FVC-Pre L 2.87  2.70  2.86  2.86  2.88  2.78  2.95   FVC-Predicted Pre % 61  57  71  71  71  69  73   FVC-Post L     2.88  2.82  2.74   FVC-Predicted Post %     71  70  67   Pre FEV1/FVC % % 65  70  71  68  70  74  73   Post FEV1/FCV % %     75  74  76   FEV1-Pre L 1.86  1.90  2.02  1.95  2.01  2.05  2.15   FEV1-Predicted Pre % 50  50  62  59  61  62  65   FEV1-Post L     2.16  2.10  2.07   DLCO uncorrected ml/min/mmHg 12.99  13.40  12.96  14.87  16.64  15.05  14.70   DLCO UNC% % 47  48  46  53  59  53  41   DLCO corrected ml/min/mmHg     16.90     DLCO COR %Predicted %     60     DLVA Predicted % 77  78  77  82  90  90  60   TLC L  4.73  4.82   4.91  5.16  4.63   TLC % Predicted %  75  77   78  82  74   RV % Predicted %  87  92   96  113  83  CT ANgio 10/08/23   IMPRESSION: No evidence of  pulmonary embolism.   Some findings suggestive of pulmonary arterial hypertension are noted.   Diffuse fibrotic changes particularly in the bases bilaterally. Associated nodularity is seen which is slightly increased in the interval from 2022 felt to be progression of scleroderma related fibrosis and nodular change. Given the increase in size from the prior exam, follow-up examination in 6 months is recommended to assess for stability.   Cholelithiasis     Electronically Signed   By: Oneil Devonshire M.D.   On: 10/08/2023 19:44   LAB RESULTS last 96 hours No results found.       has a past medical history of Anemia and Scleredema (HCC).   reports that she has never smoked. She has never used smokeless tobacco.  Past Surgical History:  Procedure Laterality Date   BREAST BIOPSY Right    BREAST CYST EXCISION Left 1996   x2   CESAREAN SECTION     CESAREAN SECTION N/A 12/21/2015   Procedure: CESAREAN SECTION;  Surgeon: Rexene Hoit, MD;  Location: [facility] BIRTHING SUIT[facility];  Service: Obstetrics;  Laterality: N/A;   CYST REMOVAL HAND  2008   DILATION AND EVACUATION N/A 06/28/2014   Procedure: DILATATION AND EVACUATION;  Surgeon: Rexene Hoit, MD;  Location: [facility] ORS;  Service: Gynecology;  Laterality: N/A;   WISDOM TOOTH EXTRACTION      No Known Allergies  Immunization History  Administered Date(s) Administered   Fluzone  Influenza virus vaccine,trivalent (IIV3), split virus 10/19/2013, 11/22/2016   Influenza Inj Mdck Quad Pf 10/03/2017   Influenza Split 10/27/2009   Influenza, Seasonal, Injecte, Preservative Fre 03/19/2023, 10/14/2023   Influenza,inj,Quad PF,6+ Mos 10/10/2010, 10/31/2015, 11/15/2015, 10/03/2018   Influenza,inj,quad, With Preservative 12/13/2014   Influenza-Unspecified 10/16/2012, 11/23/2014, 10/31/2015, 11/05/2016   Moderna Sars-Covid-2 Vaccination 03/06/2019, 04/04/2019   PNEUMOCOCCAL CONJUGATE-20 09/07/2021   Pneumococcal Polysaccharide-23 11/04/2013, 04/04/2017    Tdap 10/10/2010, 11/15/2015    Family History  Problem Relation Age of Onset   Diabetes Father    Breast cancer Neg Hx      Current Outpatient Medications:    albuterol  (VENTOLIN  HFA) 108 (90 Base) MCG/ACT inhaler, Inhale 2 puffs into the lungs every 6 (six) hours as needed for wheezing or shortness of breath., Disp: 8 g, Rfl: 2   amLODipine (NORVASC) 10 MG tablet, Take 10 mg by mouth daily., Disp: , Rfl:    budesonide -formoterol  (SYMBICORT ) 160-4.5 MCG/ACT inhaler, Inhale 2 puffs into the lungs 2 (two) times daily., Disp: 1 Inhaler, Rfl: 0   cetirizine (ZYRTEC ALLERGY) 10 MG tablet, Take 10 mg by mouth daily., Disp: , Rfl:    Cholecalciferol (VITAMIN D) 2000 UNITS tablet, Take 2,000 Units by mouth every evening., Disp: , Rfl:    diclofenac Sodium (VOLTAREN) 1 % GEL, Apply 2 g topically daily as needed (for pain)., Disp: , Rfl:    esomeprazole  (NEXIUM ) 40 MG capsule, Take 30- 60 min before your first and last meals of the day, Disp: , Rfl:    hydroxychloroquine  (PLAQUENIL ) 200 MG tablet, Take 200 mg by mouth 2 (two) times daily., Disp: , Rfl:    ibuprofen  (ADVIL ,MOTRIN ) 800 MG tablet, Take 800 mg by mouth every 8 (eight) hours as needed., Disp: , Rfl:    ipratropium-albuterol  (DUONEB) 0.5-2.5 (3) MG/3ML SOLN, Take 3 mLs by nebulization every 4 (four) hours as needed., Disp: 120 mL, Rfl: 2   mycophenolate  (CELLCEPT ) 500 MG tablet, Take 500 mg by mouth 2 (two) times daily., Disp: , Rfl:  pentoxifylline  (TRENTAL ) 400 MG CR tablet, Take 400 mg by mouth 2 (two) times daily., Disp: , Rfl:    phentermine (ADIPEX-P) 37.5 MG tablet, Take 37.5 mg by mouth daily before breakfast., Disp: , Rfl:    prazosin  (MINIPRESS ) 1 MG capsule, Take 1 mg by mouth 2 (two) times daily., Disp: , Rfl:    Prenatal 27-1 MG TABS, Take 1 tablet by mouth daily., Disp: , Rfl:    valACYclovir (VALTREX) 500 MG tablet, Take 500 mg by mouth daily., Disp: , Rfl:       Objective:   Vitals:   11/07/23 1034  BP: 118/78   Weight: 231 lb 9.6 oz (105.1 kg)  Height: 6' (1.829 m)    Estimated body mass index is 31.41 kg/m as calculated from the following:   Height as of this encounter: 6' (1.829 m).   Weight as of this encounter: 231 lb 9.6 oz (105.1 kg).  @WEIGHTCHANGE @  Filed Weights   11/07/23 1034  Weight: 231 lb 9.6 oz (105.1 kg)     Physical Exam   General: No distress. Looks well O2 at rest: no Cane present: no Sitting in wheel chair: no Frail: no Obese: n Neuro: Alert and Oriented x 3. GCS 15. Speech normal Psych: Pleasant Resp:  Barrel Chest - ono.  Wheeze - no, Crackles - no, No overt respiratory distress CVS: Normal heart sounds. Murmurs - no Ext: Stigmata of Connective Tissue Disease - SCLERDERMA. HEENT: Normal upper airway. PEERL +. No post nasal drip        Assessment/     Assessment & Plan Interstitial lung disease due to connective tissue disease (HCC)  ILD (interstitial lung disease) (HCC)  High risk medication use  On Cellcept  therapy  Exposure to phentermine, initial encounter  Hospital discharge follow-up    PLAN Patient Instructions     ICD-10-CM   1. Interstitial lung disease due to connective tissue disease (HCC)  J84.89    M35.9     2. ILD (interstitial lung disease) (HCC)  J84.9     3. High risk medication use  Z79.899     4. On Cellcept  therapy  Z79.624     5. Exposure to phentermine, initial encounter  T50.5X5A     6. Hospital discharge follow-up  Z09       #ILD  Based on the pulmonary function test there might be very slow progression of interstitial lung disease slowly over time over many years..    Plan - 3 months to spirometry and DLCO - 3 months do high-resolution CT chest - Continue CellCept  500 mg twice daily - Based on the results of the follow-up test we will consider Nerandomilast antifibrotic   Phentermine exposure x 3 months  - Phentermine associated with some risk of getting pulmonary hypertension.  There is also  risk in getting pulmonary hypertension because of his scleroderma.There is also possibility that the decline in diffusion capacity could be because of pulmonary hypertension but the normal echo and normal BNP in September 2025 suggests a low possibility only.  Plan - Stop phentermine but inform your primary care physician   Follow-up - 3 months with Dr. Geronimo 15-minute visit; ILD symptom score at follow-up    FOLLOWUP    Return in about 3 months (around 02/07/2024) for - 3 months with Dr. Geronimo 15-minute visit;.    SIGNATURE    Dr. Dorethia Geronimo, M.D., F.C.C.P,  Pulmonary and Critical Care Medicine Staff Physician, [facility] Director - Interstitial  Lung Disease  Program  [facility] [place], [place], [address]  Pager: [phone], If no answer or between  15:00h - 7:00h: call [phone] Telephone: [phone]  11:16 AM 11/07/2023

## 2023-12-16 ENCOUNTER — Other Ambulatory Visit: Payer: Self-pay | Admitting: Family Medicine

## 2023-12-16 DIAGNOSIS — N644 Mastodynia: Secondary | ICD-10-CM

## 2024-01-03 ENCOUNTER — Ambulatory Visit: Admitting: Internal Medicine

## 2024-01-20 ENCOUNTER — Other Ambulatory Visit: Payer: Self-pay

## 2024-01-20 ENCOUNTER — Encounter (HOSPITAL_BASED_OUTPATIENT_CLINIC_OR_DEPARTMENT_OTHER): Payer: Self-pay | Admitting: Emergency Medicine

## 2024-01-20 ENCOUNTER — Other Ambulatory Visit: Payer: Self-pay | Admitting: Medical Genetics

## 2024-01-20 ENCOUNTER — Emergency Department (HOSPITAL_BASED_OUTPATIENT_CLINIC_OR_DEPARTMENT_OTHER): Admitting: Radiology

## 2024-01-20 DIAGNOSIS — L0889 Other specified local infections of the skin and subcutaneous tissue: Secondary | ICD-10-CM | POA: Diagnosis present

## 2024-01-20 DIAGNOSIS — L03012 Cellulitis of left finger: Principal | ICD-10-CM | POA: Diagnosis present

## 2024-01-20 DIAGNOSIS — I10 Essential (primary) hypertension: Secondary | ICD-10-CM | POA: Diagnosis present

## 2024-01-20 DIAGNOSIS — Z833 Family history of diabetes mellitus: Secondary | ICD-10-CM

## 2024-01-20 DIAGNOSIS — Z79624 Long term (current) use of inhibitors of nucleotide synthesis: Secondary | ICD-10-CM

## 2024-01-20 DIAGNOSIS — Z7951 Long term (current) use of inhaled steroids: Secondary | ICD-10-CM

## 2024-01-20 DIAGNOSIS — J8489 Other specified interstitial pulmonary diseases: Secondary | ICD-10-CM | POA: Diagnosis present

## 2024-01-20 DIAGNOSIS — M349 Systemic sclerosis, unspecified: Secondary | ICD-10-CM | POA: Diagnosis present

## 2024-01-20 DIAGNOSIS — Z79899 Other long term (current) drug therapy: Secondary | ICD-10-CM

## 2024-01-20 DIAGNOSIS — I272 Pulmonary hypertension, unspecified: Secondary | ICD-10-CM | POA: Diagnosis present

## 2024-01-20 NOTE — ED Triage Notes (Signed)
 UC today for finger deformity. Left pointer.   Concerned for HTN and possible arrhythmia after EKG with UC for HTN-no CP SOB.

## 2024-01-21 ENCOUNTER — Inpatient Hospital Stay (HOSPITAL_BASED_OUTPATIENT_CLINIC_OR_DEPARTMENT_OTHER)
Admission: EM | Admit: 2024-01-21 | Discharge: 2024-01-22 | DRG: 603 | Disposition: A | Discharge status: Home / Self Care

## 2024-01-21 DIAGNOSIS — M7989 Other specified soft tissue disorders: Secondary | ICD-10-CM

## 2024-01-21 DIAGNOSIS — Z79624 Long term (current) use of inhibitors of nucleotide synthesis: Secondary | ICD-10-CM | POA: Diagnosis not present

## 2024-01-21 DIAGNOSIS — J8489 Other specified interstitial pulmonary diseases: Secondary | ICD-10-CM | POA: Diagnosis present

## 2024-01-21 DIAGNOSIS — I10 Essential (primary) hypertension: Secondary | ICD-10-CM | POA: Diagnosis present

## 2024-01-21 DIAGNOSIS — M79645 Pain in left finger(s): Principal | ICD-10-CM

## 2024-01-21 DIAGNOSIS — L0889 Other specified local infections of the skin and subcutaneous tissue: Secondary | ICD-10-CM | POA: Diagnosis present

## 2024-01-21 DIAGNOSIS — M349 Systemic sclerosis, unspecified: Secondary | ICD-10-CM | POA: Diagnosis present

## 2024-01-21 DIAGNOSIS — I272 Pulmonary hypertension, unspecified: Secondary | ICD-10-CM | POA: Diagnosis present

## 2024-01-21 DIAGNOSIS — L03012 Cellulitis of left finger: Secondary | ICD-10-CM | POA: Diagnosis present

## 2024-01-21 DIAGNOSIS — Z79899 Other long term (current) drug therapy: Secondary | ICD-10-CM | POA: Diagnosis not present

## 2024-01-21 DIAGNOSIS — Z7951 Long term (current) use of inhaled steroids: Secondary | ICD-10-CM | POA: Diagnosis not present

## 2024-01-21 DIAGNOSIS — Z833 Family history of diabetes mellitus: Secondary | ICD-10-CM | POA: Diagnosis not present

## 2024-01-21 LAB — COMPREHENSIVE METABOLIC PANEL WITH GFR
ALT: 10 U/L (ref 0–44)
AST: 27 U/L (ref 15–41)
Albumin: 3.9 g/dL (ref 3.5–5.0)
Alkaline Phosphatase: 80 U/L (ref 38–126)
Anion gap: 9 (ref 5–15)
BUN: 13 mg/dL (ref 6–20)
CO2: 24 mmol/L (ref 22–32)
Calcium: 8.8 mg/dL — ABNORMAL LOW (ref 8.9–10.3)
Chloride: 105 mmol/L (ref 98–111)
Creatinine, Ser: 0.61 mg/dL (ref 0.44–1.00)
GFR, Estimated: >60 mL/min
Glucose, Bld: 96 mg/dL (ref 70–99)
Potassium: 4.4 mmol/L (ref 3.5–5.1)
Sodium: 138 mmol/L (ref 135–145)
Total Bilirubin: 0.4 mg/dL (ref 0.0–1.2)
Total Protein: 7.5 g/dL (ref 6.5–8.1)

## 2024-01-21 LAB — CBC WITH DIFFERENTIAL/PLATELET
Abs Immature Granulocytes: 0.01 K/uL (ref 0.00–0.07)
Basophils Absolute: 0 K/uL (ref 0.0–0.1)
Basophils Relative: 1 %
Eosinophils Absolute: 0.1 K/uL (ref 0.0–0.5)
Eosinophils Relative: 3 %
HCT: 38.3 % (ref 36.0–46.0)
Hemoglobin: 12.3 g/dL (ref 12.0–15.0)
Immature Granulocytes: 0 %
Lymphocytes Relative: 41 %
Lymphs Abs: 1.7 K/uL (ref 0.7–4.0)
MCH: 25.3 pg — ABNORMAL LOW (ref 26.0–34.0)
MCHC: 32.1 g/dL (ref 30.0–36.0)
MCV: 78.6 fL — ABNORMAL LOW (ref 80.0–100.0)
Monocytes Absolute: 0.4 K/uL (ref 0.1–1.0)
Monocytes Relative: 10 %
Neutro Abs: 1.9 K/uL (ref 1.7–7.7)
Neutrophils Relative %: 45 %
Platelets: 232 K/uL (ref 150–400)
RBC: 4.87 MIL/uL (ref 3.87–5.11)
RDW: 13.2 % (ref 11.5–15.5)
WBC: 4.1 K/uL (ref 4.0–10.5)
nRBC: 0 % (ref 0.0–0.2)

## 2024-01-21 MED ORDER — HYDROXYCHLOROQUINE SULFATE 200 MG PO TABS
200.0000 mg | ORAL_TABLET | Freq: Two times a day (BID) | ORAL | Status: DC
  Administered 2024-01-21 – 2024-01-22 (×2): 200 mg via ORAL
  Filled 2024-01-21 (×3): qty 1

## 2024-01-21 MED ORDER — IPRATROPIUM-ALBUTEROL 0.5-2.5 (3) MG/3ML IN SOLN: 3.0000 mL | RESPIRATORY_TRACT | Status: DC | PRN

## 2024-01-21 MED ORDER — VANCOMYCIN HCL 1750 MG/350ML IV SOLN
1750.0000 mg | Freq: Two times a day (BID) | INTRAVENOUS | Status: DC
  Administered 2024-01-21 – 2024-01-22 (×2): 1750 mg via INTRAVENOUS
  Filled 2024-01-21 (×4): qty 350

## 2024-01-21 MED ORDER — INDOMETHACIN 25 MG PO CAPS
25.0000 mg | ORAL_CAPSULE | Freq: Three times a day (TID) | ORAL | Status: DC
  Administered 2024-01-21 – 2024-01-22 (×3): 25 mg via ORAL
  Filled 2024-01-21 (×4): qty 1

## 2024-01-21 MED ORDER — VANCOMYCIN HCL IN DEXTROSE 1-5 GM/200ML-% IV SOLN
1000.0000 mg | INTRAVENOUS | Status: AC
  Administered 2024-01-21 (×2): 1000 mg via INTRAVENOUS
  Filled 2024-01-21 (×2): qty 200

## 2024-01-21 MED ORDER — ACETAMINOPHEN 325 MG PO TABS: 650.0000 mg | ORAL_TABLET | Freq: Four times a day (QID) | ORAL | Status: DC | PRN

## 2024-01-21 MED ORDER — KETOROLAC TROMETHAMINE 15 MG/ML IJ SOLN
15.0000 mg | Freq: Once | INTRAMUSCULAR | Status: AC
  Administered 2024-01-21: 15 mg via INTRAVENOUS
  Filled 2024-01-21: qty 1

## 2024-01-21 MED ORDER — ONDANSETRON HCL 4 MG PO TABS: 4.0000 mg | ORAL_TABLET | Freq: Four times a day (QID) | ORAL | Status: DC | PRN

## 2024-01-21 MED ORDER — LACTATED RINGERS IV BOLUS
1000.0000 mL | Freq: Once | INTRAVENOUS | Status: AC
  Administered 2024-01-21: 1000 mL via INTRAVENOUS

## 2024-01-21 MED ORDER — FLUTICASONE FUROATE-VILANTEROL 100-25 MCG/ACT IN AEPB
1.0000 | INHALATION_SPRAY | Freq: Every day | RESPIRATORY_TRACT | Status: DC
  Administered 2024-01-22: 1 via RESPIRATORY_TRACT
  Filled 2024-01-21: qty 28

## 2024-01-21 MED ORDER — VALACYCLOVIR HCL 500 MG PO TABS
500.0000 mg | ORAL_TABLET | Freq: Every day | ORAL | Status: DC
  Administered 2024-01-21 – 2024-01-22 (×2): 500 mg via ORAL
  Filled 2024-01-21 (×2): qty 1

## 2024-01-21 MED ORDER — PENTOXIFYLLINE ER 400 MG PO TBCR
400.0000 mg | EXTENDED_RELEASE_TABLET | Freq: Two times a day (BID) | ORAL | Status: DC
  Administered 2024-01-21 – 2024-01-22 (×2): 400 mg via ORAL
  Filled 2024-01-21 (×3): qty 1

## 2024-01-21 MED ORDER — FENTANYL CITRATE (PF) 50 MCG/ML IJ SOSY
50.0000 ug | PREFILLED_SYRINGE | Freq: Once | INTRAMUSCULAR | Status: AC
  Administered 2024-01-21: 50 ug via INTRAVENOUS
  Filled 2024-01-21: qty 1

## 2024-01-21 MED ORDER — PIPERACILLIN-TAZOBACTAM 3.375 G IVPB
3.3750 g | Freq: Three times a day (TID) | INTRAVENOUS | Status: DC
  Administered 2024-01-21 – 2024-01-22 (×3): 3.375 g via INTRAVENOUS
  Filled 2024-01-21 (×4): qty 50

## 2024-01-21 MED ORDER — ACETAMINOPHEN 650 MG RE SUPP: 650.0000 mg | Freq: Four times a day (QID) | RECTAL | Status: DC | PRN

## 2024-01-21 MED ORDER — MYCOPHENOLATE MOFETIL 250 MG PO CAPS
500.0000 mg | ORAL_CAPSULE | Freq: Two times a day (BID) | ORAL | Status: DC
  Administered 2024-01-21 – 2024-01-22 (×2): 500 mg via ORAL
  Filled 2024-01-21 (×3): qty 2

## 2024-01-21 MED ORDER — PANTOPRAZOLE SODIUM 40 MG PO TBEC
40.0000 mg | DELAYED_RELEASE_TABLET | Freq: Every day | ORAL | Status: DC
  Administered 2024-01-21 – 2024-01-22 (×2): 40 mg via ORAL
  Filled 2024-01-21 (×2): qty 1

## 2024-01-21 MED ORDER — OXYCODONE HCL 5 MG PO TABS
5.0000 mg | ORAL_TABLET | ORAL | Status: DC | PRN
  Administered 2024-01-21: 5 mg via ORAL
  Filled 2024-01-21: qty 1

## 2024-01-21 MED ORDER — FENTANYL CITRATE (PF) 50 MCG/ML IJ SOSY: 12.5000 ug | PREFILLED_SYRINGE | INTRAMUSCULAR | Status: DC | PRN

## 2024-01-21 MED ORDER — PIPERACILLIN-TAZOBACTAM 3.375 G IVPB 30 MIN
3.3750 g | Freq: Once | INTRAVENOUS | Status: AC
  Administered 2024-01-21: 3.375 g via INTRAVENOUS
  Filled 2024-01-21: qty 50

## 2024-01-21 MED ORDER — ONDANSETRON HCL 4 MG/2ML IJ SOLN: 4.0000 mg | Freq: Four times a day (QID) | INTRAMUSCULAR | Status: DC | PRN

## 2024-01-21 MED ORDER — INDOMETHACIN 25 MG PO CAPS
25.0000 mg | ORAL_CAPSULE | Freq: Three times a day (TID) | ORAL | Status: DC
  Filled 2024-01-21: qty 1

## 2024-01-21 MED ORDER — PRAZOSIN HCL 1 MG PO CAPS
1.0000 mg | ORAL_CAPSULE | Freq: Two times a day (BID) | ORAL | Status: DC
  Administered 2024-01-21 – 2024-01-22 (×2): 1 mg via ORAL
  Filled 2024-01-21 (×3): qty 1

## 2024-01-21 MED ORDER — LORATADINE 10 MG PO TABS
10.0000 mg | ORAL_TABLET | Freq: Every day | ORAL | Status: DC
  Administered 2024-01-21 – 2024-01-22 (×2): 10 mg via ORAL
  Filled 2024-01-21 (×2): qty 1

## 2024-01-21 NOTE — Progress Notes (Signed)
 Pharmacy Antibiotic Note  Laura Rowland is a 48 y.o. female admitted on 01/21/2024 with tenosynovitis.  Pharmacy has been consulted for vancomycin  and zosyn  dosing. Pt is afebrile and WBC is WNL. Scr is WNL.  Plan: Vancomycin  1750mg  IV Q12H  Zosyn  3.375g IV Q8H (4 hr inf) F/u renal fxn, C&S, clinical status and peak/trough at SS     Temp (24hrs), Avg:98.3 F (36.8 C), Min:97.9 F (36.6 C), Max:98.7 F (37.1 C)  Recent Labs  Lab 01/21/24 0652  WBC 4.1  CREATININE 0.61    CrCl cannot be calculated (Unknown ideal weight.).    Allergies[1]  Antimicrobials this admission: Vanc 12/30>> Zosyn  12/30>>  Dose adjustments this admission: N/A  Microbiology results: Pending  Thank you for allowing pharmacy to be a part of this patients care.  Yoandri Congrove, Vernell Helling 01/21/2024 2:40 PM     [1] No Known Allergies

## 2024-01-21 NOTE — ED Notes (Signed)
 [place] with [facility] called for transport

## 2024-01-21 NOTE — ED Notes (Signed)
 [facility] on the way. Report to transport team. Per Dr. Danton, zosyn  to be paused for transport and resumed by IP team.

## 2024-01-21 NOTE — Progress Notes (Signed)
 Virtual admission from [facility]- Hospitalist called to briefly see patient. 48 year old female with history of scleroderma baseline contractures, presented with worsening contraction, swelling and redness to her left fore finger.   She reports continued pain, indomethacin  has been ordered.  IV Zosyn  and vancomycin  also ordered.  Cultures pending.  She has no other complaints. Agree with H&P by admitting provider.   Raela Bohl E Sakib Noguez 01/21/2024 7:36 PM  LOS- No charge.

## 2024-01-21 NOTE — ED Notes (Signed)
 Attempted to get 2nd access and 2nd set of blood cultures prior to starting antibiotics but was unsuccessful. Report given to on coming nurse, Alan, RN

## 2024-01-21 NOTE — Plan of Care (Signed)
 Transfer from [facility] discussed with Dr. Freddi Newcomer scleroderma who presents with a infection of the left second digit.  Patient afebrile with blood pressures elevated up to 163/95, but all other vital signs within normal limits.  Labs were relatively unremarkable.  Left hand x-rays revealed flexion deformities of the second and digit.  There was concern for the possibility of tenosynovitis.  ED provider discussed case with Dr. Arlyn from hand who recommended IV antibiotics and they will evaluate.  1 set of blood cultures were able to be obtained prior to antibiotics.  Patient was given 1 L of lactated Ringer 's, pain medications, and  Zosyn .  Accepted to a MedSurg bed inpatient status.  Will need to notify orthopedics upon patient's arrival.

## 2024-01-21 NOTE — ED Provider Notes (Signed)
 " [place] EMERGENCY DEPARTMENT AT [facility] Provider Note   CSN: [id number] Arrival date & time: 01/20/24  2306     Patient presents with: Finger Injury   Laura Rowland is a 48 y.o. female.   HPI   48 year old female with medical history significant for scleroderma and contractures at baseline presenting to the emergency department with concern for infection of the finger.  Patient states that her left second digit has had worsening fusiform swelling, endorses pain with extension of the finger, tenderness about the flexor tendon.  She was initially seen at urgent care and referred to the emergency department due to concern for flexor tenosynovitis.  Additionally, her blood pressure was high and she was told in urgent care that she had a concerning EKG.  Denies any chest pain or shortness of breath.  Prior to Admission medications  Medication Sig Start Date End Date Taking? Authorizing Provider  albuterol  (VENTOLIN  HFA) 108 (90 Base) MCG/ACT inhaler Inhale 2 puffs into the lungs every 6 (six) hours as needed for wheezing or shortness of breath. 09/08/21   Darlean Ozell NOVAK, MD  amLODipine (NORVASC) 10 MG tablet Take 10 mg by mouth daily. 05/20/19   [provider]  budesonide -formoterol  (SYMBICORT ) 160-4.5 MCG/ACT inhaler Inhale 2 puffs into the lungs 2 (two) times daily. 10/03/18   Darlean Ozell NOVAK, MD  cetirizine (ZYRTEC ALLERGY) 10 MG tablet Take 10 mg by mouth daily. 09/29/21   [provider]  Cholecalciferol (VITAMIN D) 2000 UNITS tablet Take 2,000 Units by mouth every evening.    [provider]  diclofenac Sodium (VOLTAREN) 1 % GEL Apply 2 g topically daily as needed (for pain).    [provider]  esomeprazole  (NEXIUM ) 40 MG capsule Take 30- 60 min before your first and last meals of the day 06/22/15   Darlean Ozell NOVAK, MD  hydroxychloroquine  (PLAQUENIL ) 200 MG tablet Take 200 mg by mouth 2 (two) times daily.    [provider]   ibuprofen  (ADVIL ,MOTRIN ) 800 MG tablet Take 800 mg by mouth every 8 (eight) hours as needed.    [provider]  ipratropium-albuterol  (DUONEB) 0.5-2.5 (3) MG/3ML SOLN Take 3 mLs by nebulization every 4 (four) hours as needed. 12/05/20   Darlean Ozell NOVAK, MD  mycophenolate  (CELLCEPT ) 500 MG tablet Take 500 mg by mouth 2 (two) times daily. 12/02/19   [provider]  pentoxifylline  (TRENTAL ) 400 MG CR tablet Take 400 mg by mouth 2 (two) times daily. 05/20/19   [provider]  phentermine (ADIPEX-P) 37.5 MG tablet Take 37.5 mg by mouth daily before breakfast. 11/20/21   [provider]  prazosin  (MINIPRESS ) 1 MG capsule Take 1 mg by mouth 2 (two) times daily. 05/20/19   [provider]  Prenatal 27-1 MG TABS Take 1 tablet by mouth daily. 05/15/19   [provider]  valACYclovir  (VALTREX ) 500 MG tablet Take 500 mg by mouth daily.    [provider]    Allergies: Patient has no known allergies.    Review of Systems  All other systems reviewed and are negative.   Updated Vital Signs BP (!) 146/74   Pulse 69   Temp 97.9 F (36.6 C) (Oral)   Resp 17   SpO2 100%   Physical Exam Vitals and nursing note reviewed.  Constitutional:      General: She is not in acute distress. HENT:     Head: Normocephalic and atraumatic.  Eyes:     Conjunctiva/sclera: Conjunctivae  normal.     Pupils: Pupils are equal, round, and reactive to light.  Cardiovascular:     Rate and Rhythm: Normal rate and regular rhythm.  Pulmonary:     Effort: Pulmonary effort is normal. No respiratory distress.  Abdominal:     General: There is no distension.     Tenderness: There is no guarding.  Musculoskeletal:        General: No deformity or signs of injury.     Cervical back: Neck supple.     Comments: Contractures of the hands bilaterally given the patient's history of scleroderma, left index finger with slight discoloration, very mild fusiform swelling,  tenderness to palpation about the pad and mid flexor tendon sheath, no tenderness about the base/proximal flexor tendon sheath, mild pain with passive extension.  Ulceration to the dorsum of the finger with some skin breakdown  Skin:    Findings: No lesion or rash.  Neurological:     General: No focal deficit present.     Mental Status: She is alert. Mental status is at baseline.     (all labs ordered are listed, but only abnormal results are displayed) Labs Reviewed  CULTURE, BLOOD (ROUTINE X 2)  CULTURE, BLOOD (ROUTINE X 2)  CBC WITH DIFFERENTIAL/PLATELET  COMPREHENSIVE METABOLIC PANEL WITH GFR    EKG: EKG Interpretation Date/Time:  Monday January 20 2024 23:18:29 EST Ventricular Rate:  77 PR Interval:  150 QRS Duration:  108 QT Interval:  416 QTC Calculation: 470 R Axis:   7  Text Interpretation: Normal sinus rhythm Possible Left atrial enlargement Septal infarct , age undetermined No significant change since last tracing Confirmed by Jerrol Agent ([id number]) on 01/21/2024 6:22:17 AM  Radiology: [facility] Hand Complete Left Result Date: 01/21/2024 EXAM: 3 OR MORE VIEW(S) XRAY OF THE LEFT HAND 01/20/2024 11:29:00 PM COMPARISON: None available. CLINICAL HISTORY: Deformity of left pointer. FINDINGS: BONES AND JOINTS: No acute fracture. Flexion deformity of fifth digit at proximal interphalangeal joint. Flexion deformity of second digit at distal interphalangeal joint. SOFT TISSUES: The soft tissues are unremarkable. IMPRESSION: 1. Flexion deformity of second digit at distal interphalangeal joint. 2. Flexion deformity of fifth digit at proximal interphalangeal joint. Electronically signed by: Franky Stanford MD 01/21/2024 01:59 AM EST RP Workstation: [id number]     Procedures   Medications Ordered in the ED  fentaNYL  (SUBLIMAZE ) injection 50 mcg (has no administration in time range)  vancomycin  (VANCOCIN ) IVPB 1000 mg/200 mL premix (has no administration in time range)   piperacillin -tazobactam (ZOSYN ) IVPB 3.375 g (has no administration in time range)  ketorolac  (TORADOL ) 15 MG/ML injection 15 mg (has no administration in time range)  lactated ringers  bolus 1,000 mL (1,000 mLs Intravenous New Bag/Given 01/21/24 9361)                                    Medical Decision Making Amount and/or Complexity of Data Reviewed Labs: ordered. Radiology: ordered.  Risk Prescription drug management. Decision regarding hospitalization.    48 year old female with medical history significant for scleroderma and contractures at baseline presenting to the emergency department with concern for infection of the finger.  Patient states that her left second digit has had worsening fusiform swelling, endorses pain with extension of the finger, tenderness about the flexor tendon.  She was initially seen at urgent care and referred to the emergency department due to concern for flexor tenosynovitis.  Additionally, her blood pressure was  high and she was told in urgent care that she had a concerning EKG.  Denies any chest pain or shortness of breath.  On arrival, the patient was afebrile, not tachycardic or tachypneic, hypertensive BP 163/95, saturating 100% on room air.  Patient presenting with concern for finger infection, sent from urgent care for evaluation of this.  On exam, the patient had very mildly positive Knievel signs although difficult to evaluate given the patient's known contractures and known scleroderma.  IV access was obtained and screening labs were obtained and were pending at time of signout.  X-ray imaging: IMPRESSION:  1. Flexion deformity of second digit at distal interphalangeal joint.  2. Flexion deformity of fifth digit at proximal interphalangeal joint.    Spoke with Dr. Arlyn of on-call hand surgery who recommended observation in the hospital, IV antibiotics, NSAIDs, will see in consultation.  Plan at time of signout to follow-up results of  laboratory evaluation, admit the patient to the hospitalist for further management.  Signout given to Dr. Freddi at 0700.     Final diagnoses:  Pain of finger of left hand  Swelling of finger    ED Discharge Orders     None          Jerrol Agent, MD 01/21/24 9345  "

## 2024-01-21 NOTE — H&P (Addendum)
 "  ADMISSION HISTORY AND PHYSICAL   Laura Rowland FMW:[id number] DOB: [birth year] DOA: 01/21/2024  PCP: Cleotilde Planas, MD Patient coming from: home via [facility]   Chief Complaint: infection of L second finger   HPI:  48 year old with a history of HTN, scleroderma w/ associated ILD/Pulm HTN, and chronic finger contractures who presented to the ER with pain in her left second finger associated with severe swelling made worse with attempts at extension of the finger with marked tenderness of the flexor tendon.  She was seen initially at an urgent care and then referred to the ER due to concern for flexor tenosynovitis.  In the ER patient was found to have an elevated blood pressure at 163/95 but vital signs were otherwise stable.  X-rays of the left hand revealed flexion deformities of the 2nd and 5th digits but no acute findings.  The ED provider discussed the case with on call Hand Surgeon at the time who recommended IV antibiotics with inpatient evaluation upon transfer to [facility].  Assessment/Plan  Possible flexor tenosynovitis L second finger (index finger)  Continue broad empiric abx coverage - Hand Surgery to evaluate on arrival (I have contacted Ozell Ned with Ortho to make him aware)   HTN BP modestly elevated due to pain - continue usual home meds - no indication for more strict control acutely - follow w/ improved pain control   Scleroderma w/ ILD/Pulm HTN and chronic contractures of fingers  Hold usual immune modulators (plaquenil  and cellcept ) for a very short time while the course of her finger treatment is clarified   DVT prophylaxis: SCDs Code Status:   Code Status: Full Code Family Communication: no family present at time of exam Disposition Plan:  Admit to Inpatient   Review of Systems: As per HPI otherwise 10 point review of systems negative.   Past Medical History:  Diagnosis Date   Anemia    Scleredema (HCC)     Past Surgical History:  Procedure  Laterality Date   BREAST BIOPSY Right    BREAST CYST EXCISION Left 1996   x2   CESAREAN SECTION     CESAREAN SECTION N/A 12/21/2015   Procedure: CESAREAN SECTION;  Surgeon: Rexene Hoit, MD;  Location: [facility] BIRTHING SUITES;  Service: Obstetrics;  Laterality: N/A;   CYST REMOVAL HAND  2008   DILATION AND EVACUATION N/A 06/28/2014   Procedure: DILATATION AND EVACUATION;  Surgeon: Rexene Hoit, MD;  Location: [facility] ORS;  Service: Gynecology;  Laterality: N/A;   WISDOM TOOTH EXTRACTION      Family History  Family History  Problem Relation Age of Onset   Diabetes Father    Breast cancer Neg Hx     Social History   reports that she has never smoked. She has never used smokeless tobacco. She reports that she does not drink alcohol and does not use drugs.  Allergies Allergies[1]  Prior to Admission medications  Medication Sig Start Date End Date Taking? Authorizing Provider  albuterol  (VENTOLIN  HFA) 108 (90 Base) MCG/ACT inhaler Inhale 2 puffs into the lungs every 6 (six) hours as needed for wheezing or shortness of breath. 09/08/21  Yes Darlean Ozell NOVAK, MD  budesonide -formoterol  (SYMBICORT ) 160-4.5 MCG/ACT inhaler Inhale 2 puffs into the lungs 2 (two) times daily. 10/03/18  Yes Darlean Ozell NOVAK, MD  cetirizine (ZYRTEC ALLERGY) 10 MG tablet Take 10 mg by mouth daily. 09/29/21  Yes [provider]  Cholecalciferol (VITAMIN D) 2000 UNITS tablet Take 2,000 Units by mouth every evening.  Yes [provider]  esomeprazole  (NEXIUM ) 40 MG capsule Take 30- 60 min before your first and last meals of the day 06/22/15  Yes Darlean Ozell NOVAK, MD  hydroxychloroquine  (PLAQUENIL ) 200 MG tablet Take 200 mg by mouth 2 (two) times daily.   Yes [provider]  ibuprofen  (ADVIL ,MOTRIN ) 800 MG tablet Take 800 mg by mouth every 8 (eight) hours as needed.   Yes [provider]  ipratropium-albuterol  (DUONEB) 0.5-2.5 (3) MG/3ML SOLN Take 3 mLs by nebulization every 4 (four) hours as needed.  12/05/20  Yes Darlean Ozell NOVAK, MD  mycophenolate  (CELLCEPT ) 500 MG tablet Take 500 mg by mouth 2 (two) times daily. 12/02/19  Yes [provider]  pentoxifylline  (TRENTAL ) 400 MG CR tablet Take 400 mg by mouth 2 (two) times daily.   Yes [provider]  prazosin  (MINIPRESS ) 1 MG capsule Take 1 mg by mouth 2 (two) times daily. 05/20/19  Yes [provider]  Prenatal 27-1 MG TABS Take 1 tablet by mouth daily. 05/15/19  Yes [provider]  valACYclovir  (VALTREX ) 500 MG tablet Take 500 mg by mouth daily.   Yes [provider]    Physical Exam: Vitals:   01/21/24 1130 01/21/24 1200 01/21/24 1428 01/21/24 1441  BP: 112/68 115/70  (!) 160/88  Pulse: 69 70  65  Resp:  14  16  Temp:   98.2 F (36.8 C)   TempSrc:   Oral   SpO2: 100% 100%  100%    General: No acute respiratory distress Lungs: Clear to auscultation bilaterally without wheezes or crackles Cardiovascular: Regular rate and rhythm without murmur gallop or rub normal S1 and S2 Abdomen: Nontender, nondistended, soft, bowel sounds positive, no rebound, no ascites, no appreciable mass Extremities: No significant cyanosis, clubbing, or edema bilateral lower extremities - 2nd digit of L hand is mildly erythematous and diffusely swollen with signif pain on palpation with limited ability to flex the finger with no obvious cutaneous lesions or wounds and no discharge   Labs on Admission:   CBC: Recent Labs  Lab 01/21/24 0652  WBC 4.1  NEUTROABS 1.9  HGB 12.3  HCT 38.3  MCV 78.6*  PLT 232   Basic Metabolic Panel: Recent Labs  Lab 01/21/24 0652  NA 138  K 4.4  CL 105  CO2 24  GLUCOSE 96  BUN 13  CREATININE 0.61  CALCIUM  8.8*   GFR: CrCl cannot be calculated (Unknown ideal weight.).  Liver Function Tests: Recent Labs  Lab 01/21/24 0652  AST 27  ALT 10  ALKPHOS 80  BILITOT 0.4  PROT 7.5  ALBUMIN 3.9    Radiological Exams on Admission: DG Hand Complete Left Result  Date: 01/21/2024 IMPRESSION: 1. Flexion deformity of second digit at distal interphalangeal joint. 2. Flexion deformity of fifth digit at proximal interphalangeal joint. Electronically signed by: Franky Stanford MD 01/21/2024 01:59 AM EST RP Workstation: [id number]    Reyes IVAR Moores, MD [facility] Office  [phone] Pager - Text Page per Amion as per below:  On-Call/Text Page:      [email or web address]  If 7PM-7AM, please contact night-coverage [email or web address] 01/21/2024, 2:54 PM        [1] No Known Allergies  "

## 2024-01-21 NOTE — ED Provider Notes (Signed)
 Labs are unremarkable.  I discussed with Dr. Claudene, who accepts for transfer and admission.   Freddi Hamilton, MD 01/21/24 [phone]

## 2024-01-21 NOTE — ED Notes (Signed)
 First Vancomycin  completed. Patient ambulated to restroom without assistance. 2nd rounds of antibiotics started. Provided patient with food and drink upon patient request and Dr. Goldston's approval. Call bell within reach.

## 2024-01-21 NOTE — Progress Notes (Signed)
 ED Pharmacy Antibiotic Sign Off An antibiotic consult was received from an ED provider for Vancomycin  and Zosyn   per pharmacy dosing for flexor tenosynovitis. A chart review was completed to assess appropriateness.   The following one time order(s) were placed:  Vancomycin  2000 mg IV Zosyn  3.375 g IV   Further antibiotic and/or antibiotic pharmacy consults should be ordered by the admitting provider if indicated.    Dail Cordella Misty, [facility]  Clinical Pharmacist 01/21/2024 6:30 AM

## 2024-01-22 LAB — CBC
HCT: 37.4 % (ref 36.0–46.0)
Hemoglobin: 11.7 g/dL — ABNORMAL LOW (ref 12.0–15.0)
MCH: 24.6 pg — ABNORMAL LOW (ref 26.0–34.0)
MCHC: 31.3 g/dL (ref 30.0–36.0)
MCV: 78.6 fL — ABNORMAL LOW (ref 80.0–100.0)
Platelets: 202 K/uL (ref 150–400)
RBC: 4.76 MIL/uL (ref 3.87–5.11)
RDW: 13.2 % (ref 11.5–15.5)
WBC: 4.2 K/uL (ref 4.0–10.5)
nRBC: 0 % (ref 0.0–0.2)

## 2024-01-22 LAB — BASIC METABOLIC PANEL WITH GFR
Anion gap: 8 (ref 5–15)
BUN: 9 mg/dL (ref 6–20)
CO2: 24 mmol/L (ref 22–32)
Calcium: 8.6 mg/dL — ABNORMAL LOW (ref 8.9–10.3)
Chloride: 105 mmol/L (ref 98–111)
Creatinine, Ser: 0.69 mg/dL (ref 0.44–1.00)
GFR, Estimated: >60 mL/min
Glucose, Bld: 100 mg/dL — ABNORMAL HIGH (ref 70–99)
Potassium: 3.9 mmol/L (ref 3.5–5.1)
Sodium: 137 mmol/L (ref 135–145)

## 2024-01-22 MED ORDER — INDOMETHACIN 25 MG PO CAPS: 25.0000 mg | ORAL_CAPSULE | Freq: Three times a day (TID) | ORAL | 0 refills | Status: AC

## 2024-01-22 MED ORDER — DOXYCYCLINE HYCLATE 100 MG PO TABS: 100.0000 mg | ORAL_TABLET | Freq: Two times a day (BID) | ORAL | 0 refills | Status: AC

## 2024-01-22 MED ORDER — SODIUM CHLORIDE 0.9 % IV SOLN
2.0000 g | INTRAVENOUS | Status: DC
  Administered 2024-01-22: 2 g via INTRAVENOUS
  Filled 2024-01-22: qty 20

## 2024-01-22 MED ORDER — CEFADROXIL 500 MG PO CAPS: 500.0000 mg | ORAL_CAPSULE | Freq: Two times a day (BID) | ORAL | 0 refills | Status: AC

## 2024-01-22 NOTE — Progress Notes (Signed)
 Patient to be D/C'd Home per MD order.  Discussed with the patient and all questions fully answered. VSS, Skin clean, dry and intact without evidence of skin break downexcept for the fingers, no evidence of new skin tears noted. IV catheter discontinued intact. Site without signs and symptoms of complications. Dressing and pressure applied.

## 2024-01-22 NOTE — TOC Transition Note (Signed)
 Transition of Care [facility]) - Discharge Note   Patient Details  Name: Laura Rowland MRN: [id number] Date of Birth: 22-Aug-1975  [facility] CM/SW Contact:  Roxie KANDICE Stain, RN Phone Number: 01/22/2024, 4:18 PM   Clinical Narrative:    [name] is stable to discharge home. Follow up apt on AVS. No ICM (Inpatient Care Management) needs at this time.    Final next level of care: Home/Self Care Barriers to Discharge: Barriers Resolved   Patient Goals and CMS Choice Patient states their goals for this hospitalization and ongoing recovery are:: return home          Discharge Placement             Home          Discharge Plan and Services Additional resources added to the After Visit Summary for                                       Social Drivers of Health (SDOH) Interventions SDOH Screenings   Tobacco Use: Low Risk (01/20/2024)     Readmission Risk Interventions    01/22/2024    4:18 PM  Readmission Risk Prevention Plan  Post Dischage Appt Complete  Medication Screening Complete  Transportation Screening Complete

## 2024-01-22 NOTE — Plan of Care (Signed)

## 2024-01-22 NOTE — Discharge Summary (Signed)
 " Physician Discharge Summary   Patient: Laura Rowland MRN: [id number] DOB: 1975/07/02  Admit date:     01/21/2024  Discharge date: 01/22/2024  Discharge Physician: Duffy Al-Sultani   PCP: Cleotilde Planas, MD   Recommendations at discharge:    Follow up with PCP in 1 week for routine post-hospital follow up Take Indomethacin  for 10-14 days (take with food). Stop early if pain/swelling resolves. Avoid other NSAIDs. Call/return for black stools, vomiting blood, severe abdominal pain, decreased urine, SOB, or swelling Follow up with ortho, Dr. Romona, if symptoms worsen or don't improve in 7 to 10 days  Discharge Diagnoses: Principal Problem:   Cellulitis of left finger Dactylitis   Hospital Course: 48 year old with a history of HTN, scleroderma w/ associated ILD/Pulm HTN, and chronic finger contractures who presented to the ER with pain in her left second finger associated with severe swelling made worse with attempts at extension of the finger with marked tenderness of the flexor tendon.  She was seen initially at an urgent care and then referred to the ER due to concern for flexor tenosynovitis.  In the ER patient was found to have an elevated blood pressure at 163/95 but vital signs were otherwise stable.  X-rays of the left hand revealed flexion deformities of the 2nd and 5th digits but no acute findings.  The ED provider discussed the case with on call Hand Surgeon at the time who recommended IV antibiotics with inpatient evaluation upon transfer to [facility].  She was evaluated by ortho and thought to have mild cellulitis versus acute dactylitis given her history of scleroderma, without evidence of flexor tensosynovitis or abscess. Indicated no plans for surgical intervention and recommended IV antibiotics for potential cellulitis and NSAIDs for potential acute dactylitis.   The patient's symptoms improved over the course of the following day with decrease in swelling and improvement in pain  and ROM per her report. She requested to be discharge with PO antibiotics. Offered continued observation as her finger continued to appears swollen with persistent flexion at the DIP joint and extension at PIP joint, but she reports noticeable different to her and states she is comfortable going home. Discussed that she will need to follow up with orthopedic surgery should her symptoms worsen or not resolve within 7 days - clinic contact information provided. She was prescribed 7 days of cefadroxil  and doxycycline  as well as given a prescription for indomethacin  25 mg TID.   At the time of discharge, the patient was hemodynamically stable with improved symptoms, clinically appropriate for discharge, and in no acute distress. The discharge plan, follow-up instructions, return precautions, and medication changes were reviewed in detail. The patient verbalized understanding, was in agreement with the plan, and had all questions answered prior to leaving the hospital.       Consultants: Orthopedic surgery Procedures performed: none  Disposition: Home Diet recommendation:  Diet Orders (From admission, onward)     Start     Ordered   01/21/24 1436  Diet regular Room service appropriate? Yes; Fluid consistency: Thin  Diet effective now       Question Answer Comment  Room service appropriate? Yes   Fluid consistency: Thin      01/21/24 1436            DISCHARGE MEDICATION: Allergies as of 01/22/2024   No Known Allergies      Medication List     TAKE these medications    albuterol  108 (90 Base) MCG/ACT inhaler Commonly known as:  VENTOLIN  HFA Inhale 2 puffs into the lungs every 6 (six) hours as needed for wheezing or shortness of breath.   budesonide -formoterol  160-4.5 MCG/ACT inhaler Commonly known as: Symbicort  Inhale 2 puffs into the lungs 2 (two) times daily.   cefadroxil  500 MG capsule Commonly known as: DURICEF Take 1 capsule (500 mg total) by mouth 2 (two) times daily  for 7 days.   doxycycline  100 MG tablet Commonly known as: VIBRA -TABS Take 1 tablet (100 mg total) by mouth 2 (two) times daily for 7 days.   esomeprazole  40 MG capsule Commonly known as: NexIUM  Take 30- 60 min before your first and last meals of the day   hydroxychloroquine  200 MG tablet Commonly known as: PLAQUENIL  Take 200 mg by mouth 2 (two) times daily.   ibuprofen  800 MG tablet Commonly known as: ADVIL  Take 800 mg by mouth every 8 (eight) hours as needed.   indomethacin  25 MG capsule Commonly known as: INDOCIN  Take 1 capsule (25 mg total) by mouth 3 (three) times daily with meals for 10 days. Stop early if pain/swelling resolves.   ipratropium-albuterol  0.5-2.5 (3) MG/3ML Soln Commonly known as: DUONEB Take 3 mLs by nebulization every 4 (four) hours as needed.   mycophenolate  500 MG tablet Commonly known as: CELLCEPT  Take 500 mg by mouth 2 (two) times daily.   pentoxifylline  400 MG CR tablet Commonly known as: TRENTAL  Take 400 mg by mouth 2 (two) times daily.   prazosin  1 MG capsule Commonly known as: MINIPRESS  Take 1 mg by mouth 2 (two) times daily.   Prenatal 27-1 MG Tabs Take 1 tablet by mouth daily.   valACYclovir  500 MG tablet Commonly known as: VALTREX  Take 500 mg by mouth daily.   Vitamin D 50 MCG (2000 UT) tablet Take 2,000 Units by mouth every evening.   ZyrTEC Allergy 10 MG tablet Generic drug: cetirizine Take 10 mg by mouth daily.        Follow-up Information     Cleotilde Planas, MD. Schedule an appointment as soon as possible for a visit in 1 week(s).   Specialty: Family Medicine Contact information: [address] [place] [address] [phone]         Romona Harari, MD Follow up.   Specialty: Orthopedic Surgery Why: As needed, If symptoms worsen or don't improve in 7 to 10 days Contact information: [address] [place] [address] [phone]                 Discharge Exam: There were  no vitals filed for this visit. Blood pressure 137/75, pulse 77, temperature 98.1 F (36.7 C), temperature source Oral, resp. rate 17, SpO2 100%.   Gen: NAD, A&Ox3 HEENT: NCAT, EOMI Neck: Supple, no JVD, no LAD CV: RRR, no murmurs Resp: normal WOB, CTAB, no w/r/r Abd: Soft, NTND, no guarding, BS normoactive Ext: No LE edema, pulses 2+ b/l. Left 2nd digit with swelling and skin tightness, with flexion contracture at the DIP joint and extension at PIP joint with some tenderness to palpation. This is improved from prior per her report.  Skin: Warm, dry, no rashes/lesions Neuro: CN II-XII grossly intact, strength 5/5 b/l, sensation intact Psych: Calm, cooperative, appropriate affect   Condition at discharge: good  The results of significant diagnostics from this hospitalization (including imaging, microbiology, ancillary and laboratory) are listed below for reference.   Imaging Studies: DG Hand Complete Left Result Date: 01/21/2024 EXAM: 3 OR MORE VIEW(S) XRAY OF THE LEFT HAND 01/20/2024 11:29:00 PM  COMPARISON: None available. CLINICAL HISTORY: Deformity of left pointer. FINDINGS: BONES AND JOINTS: No acute fracture. Flexion deformity of fifth digit at proximal interphalangeal joint. Flexion deformity of second digit at distal interphalangeal joint. SOFT TISSUES: The soft tissues are unremarkable. IMPRESSION: 1. Flexion deformity of second digit at distal interphalangeal joint. 2. Flexion deformity of fifth digit at proximal interphalangeal joint. Electronically signed by: Franky Stanford MD 01/21/2024 01:59 AM EST RP Workstation: [id number]    Microbiology: Results for orders placed or performed during the hospital encounter of 01/21/24  Blood culture (routine x 2)     Status: None (Preliminary result)   Collection Time: 01/21/24  6:52 AM   Specimen: BLOOD RIGHT ARM  Result Value Ref Range Status   Specimen Description   Final    BLOOD RIGHT ARM Performed at [facility],  [address] [place], [place] [address]    Special Requests   Final    BOTTLES DRAWN AEROBIC AND ANAEROBIC Blood Culture results may not be optimal due to an inadequate volume of blood received in culture bottles Performed at [facility], [address], [place], [place] [address]    Culture   Final    NO GROWTH < 24 HOURS Performed at [facility], [address] [place], [place] [address]    Report Status PENDING  Incomplete  Blood culture (routine x 2)     Status: None (Preliminary result)   Collection Time: 01/21/24  8:16 AM   Specimen: BLOOD  Result Value Ref Range Status   Specimen Description   Final    BLOOD LEFT ANTECUBITAL Performed at [facility], [address], [place], [place] [address]    Special Requests   Final    BOTTLES DRAWN AEROBIC AND ANAEROBIC Blood Culture adequate volume Performed at [facility], [address], [place], [place] [address]    Culture   Final    NO GROWTH < 24 HOURS Performed at [facility], [address], [place], [place] [address]    Report Status PENDING  Incomplete    Labs: CBC: Recent Labs  Lab 01/21/24 0652 01/22/24 0742  WBC 4.1 4.2  NEUTROABS 1.9  --   HGB 12.3 11.7*  HCT 38.3 37.4  MCV 78.6* 78.6*  PLT 232 202   Basic Metabolic Panel: Recent Labs  Lab 01/21/24 0652 01/22/24 0742  NA 138 137  K 4.4 3.9  CL 105 105  CO2 24 24  GLUCOSE 96 100*  BUN 13 9  CREATININE 0.61 0.69  CALCIUM  8.8* 8.6*   Liver Function Tests: Recent Labs  Lab 01/21/24 0652  AST 27  ALT 10  ALKPHOS 80  BILITOT 0.4  PROT 7.5  ALBUMIN 3.9   CBG: No results for input(s): GLUCAP in the last 168 hours.  Discharge time spent: Time Coordinating Discharge: I spent a total of 35 minutes engaged in face-to-face discussion with the patient and/or caregivers regarding the patients care, assessment, plan, and discharge disposition. Over 50% of this time  was dedicated to counseling the patient on the risks and benefits of treatment options and the discharge plan, as well as coordinating post-discharge care.   Signed: Brayten Komar Al-Sultani, MD [facility] 01/22/2024         "

## 2024-01-22 NOTE — Discharge Instructions (Signed)
 Take Indomethacin  for 10-14 days (take with food).  - Stop early if pain/swelling resolves.  - Avoid other NSAIDs (e.g. ibuprofen , Naproxen , Alleve) - Call/return for black stools, vomiting blood, severe abdominal pain, decreased urine, SOB, or swelling

## 2024-01-22 NOTE — Consult Note (Signed)
 "  HAND SURGERY CONSULTATION  REQUESTING PHYSICIAN: Al-Sultani, Anmar, MD   Chief Complaint: Left index finger pain  HPI: Laura Rowland is a 48 y.o. female with a history of scleroderma with contractures of multiple fingers on both hands at baseline who presents with worsening pain in the left index finger. She was seen by an outside urgent care, referred to the ER, and subsequently admitted to the hospitalist service.  She describes pain in the left index finger from about the level of the middle phalanx to the tip.  She notes that she recently developed an ulceration at the tip of this finger.  She describes worsened stiffness of this finger which is contracted at the DIP joint at baseline.  She denies pain in the remainder of her hand.  She thinks her symptoms have improved since starting IV antibiotics.    Past Medical History:  Diagnosis Date   Anemia    Scleredema (HCC)    Past Surgical History:  Procedure Laterality Date   BREAST BIOPSY Right    BREAST CYST EXCISION Left 1996   x2   CESAREAN SECTION     CESAREAN SECTION N/A 12/21/2015   Procedure: CESAREAN SECTION;  Surgeon: Rexene Hoit, MD;  Location: [facility] BIRTHING SUITES;  Service: Obstetrics;  Laterality: N/A;   CYST REMOVAL HAND  2008   DILATION AND EVACUATION N/A 06/28/2014   Procedure: DILATATION AND EVACUATION;  Surgeon: Rexene Hoit, MD;  Location: [facility] ORS;  Service: Gynecology;  Laterality: N/A;   WISDOM TOOTH EXTRACTION     Social History   Socioeconomic History   Marital status: Married    Spouse name: Not on file   Number of children: Not on file   Years of education: Not on file   Highest education level: Not on file  Occupational History   Not on file  Tobacco Use   Smoking status: Never   Smokeless tobacco: Never  Vaping Use   Vaping status: Never Used  Substance and Sexual Activity   Alcohol use: No    Comment: rarely   Drug use: No   Sexual activity: Yes    Birth control/protection: None  Other Topics  Concern   Not on file  Social History Narrative   Not on file   Social Drivers of Health   Tobacco Use: Low Risk (01/20/2024)   Patient History    Smoking Tobacco Use: Never    Smokeless Tobacco Use: Never    Passive Exposure: Not on file  Financial Resource Strain: Not on file  Food Insecurity: Not on file  Transportation Needs: Not on file  Physical Activity: Not on file  Stress: Not on file  Social Connections: Not on file  Depression (EYV7-0): Not on file  Alcohol Screen: Not on file  Housing: Not on file  Utilities: Not on file  Health Literacy: Not on file   Family History  Problem Relation Age of Onset   Diabetes Father    Breast cancer Neg Hx    - negative except otherwise stated in the family history section Allergies[1] Prior to Admission medications  Medication Sig Start Date End Date Taking? Authorizing Provider  albuterol  (VENTOLIN  HFA) 108 (90 Base) MCG/ACT inhaler Inhale 2 puffs into the lungs every 6 (six) hours as needed for wheezing or shortness of breath. 09/08/21  Yes Darlean Ozell NOVAK, MD  budesonide -formoterol  (SYMBICORT ) 160-4.5 MCG/ACT inhaler Inhale 2 puffs into the lungs 2 (two) times daily. 10/03/18  Yes Darlean Ozell NOVAK, MD  cetirizine (  ZYRTEC ALLERGY) 10 MG tablet Take 10 mg by mouth daily. 09/29/21  Yes [provider]  Cholecalciferol (VITAMIN D) 2000 UNITS tablet Take 2,000 Units by mouth every evening.   Yes [provider]  esomeprazole  (NEXIUM ) 40 MG capsule Take 30- 60 min before your first and last meals of the day 06/22/15  Yes Darlean Ozell NOVAK, MD  hydroxychloroquine  (PLAQUENIL ) 200 MG tablet Take 200 mg by mouth 2 (two) times daily.   Yes [provider]  ibuprofen  (ADVIL ,MOTRIN ) 800 MG tablet Take 800 mg by mouth every 8 (eight) hours as needed.   Yes [provider]  ipratropium-albuterol  (DUONEB) 0.5-2.5 (3) MG/3ML SOLN Take 3 mLs by nebulization every 4 (four) hours as needed. 12/05/20  Yes Darlean Ozell NOVAK,  MD  mycophenolate  (CELLCEPT ) 500 MG tablet Take 500 mg by mouth 2 (two) times daily. 12/02/19  Yes [provider]  pentoxifylline  (TRENTAL ) 400 MG CR tablet Take 400 mg by mouth 2 (two) times daily.   Yes [provider]  prazosin  (MINIPRESS ) 1 MG capsule Take 1 mg by mouth 2 (two) times daily. 05/20/19  Yes [provider]  Prenatal 27-1 MG TABS Take 1 tablet by mouth daily. 05/15/19  Yes [provider]  valACYclovir  (VALTREX ) 500 MG tablet Take 500 mg by mouth daily.   Yes [provider]   DG Hand Complete Left Result Date: 01/21/2024 EXAM: 3 OR MORE VIEW(S) XRAY OF THE LEFT HAND 01/20/2024 11:29:00 PM COMPARISON: None available. CLINICAL HISTORY: Deformity of left pointer. FINDINGS: BONES AND JOINTS: No acute fracture. Flexion deformity of fifth digit at proximal interphalangeal joint. Flexion deformity of second digit at distal interphalangeal joint. SOFT TISSUES: The soft tissues are unremarkable. IMPRESSION: 1. Flexion deformity of second digit at distal interphalangeal joint. 2. Flexion deformity of fifth digit at proximal interphalangeal joint. Electronically signed by: Franky Stanford MD 01/21/2024 01:59 AM EST RP Workstation: [id number]   - Positive ROS: All other systems have been reviewed and were otherwise negative with the exception of those mentioned in the HPI and as above.  Physical Exam: General: No acute distress, resting comfortably Cardiovascular: BUE warm and well perfused, normal rate Respiratory: Normal WOB on RA Skin: Warm and dry Neurologic: Sensation intact distally Psychiatric: Patient is at baseline mood and affect  Left Upper Extremity  Quite tight and dry skin involving all fingers and the hand.  There is a fixed flexion contracture of the index finger DIP joint with small, scabbed over ulceration at the dorsal aspect of the finger tip.  The finger is held in full extension at the PIP joint.  There is mild swelling from  the level of the PIP joint to the tip as compared to the adjacent fingers.  There is minimal erythema.  She is TTP at the volar aspect of this finger from the middle phalanx to the tip.  She is nontender over the PIP joint or volar surface over the proximal phalanx or palm.  There is no palpable fluctuance.  She has limited active flexion of this finger at baseline but has less flexion today than her baseline. All fingers are pink and well perfused.    Assessment: 48 yo F w/ scleroderma with resulting bilateral hand stiffness, contractures, and tight skin.  She presents with worsening pain in the index finger with baseline contracture and stiffness, mild swelling and no apparent erythema.  I suspect that his is a mild cellulitis versus acute dactylitis related to her scleroderma.  There is  no evidence for flexor tenosynovitis given her lack of pain along the proximal portion of the flexor tendon sheath.  There is minimal swelling with no fluctuance as would be expected with abscess or fluid collection.    Plan: - No plans for surgical intervention  - Recommend continued IV abx for potential cellulitis as well as oral NSAID for potential acute dacytlitis related to her scleroderma  - Please re-consult hand surgeon on call if she fails to improve or if she worsens over the next day or two   Thank you for the consult and the opportunity to see Ms. Joshua Bebe Galla, M.D. [facility] 8:37 AM        [1] No Known Allergies  "

## 2024-01-24 ENCOUNTER — Ambulatory Visit
Admission: RE | Admit: 2024-01-24 | Discharge: 2024-01-24 | Disposition: A | Discharge status: Home / Self Care | Source: Ambulatory Visit | Attending: Family Medicine | Admitting: Family Medicine

## 2024-01-24 DIAGNOSIS — N644 Mastodynia: Secondary | ICD-10-CM

## 2024-01-26 LAB — CULTURE, BLOOD (ROUTINE X 2)
Culture: NO GROWTH
Culture: NO GROWTH
Special Requests: ADEQUATE

## 2024-02-06 NOTE — Patient Instructions (Incomplete)
"    ICD-10-CM   1. Interstitial lung disease due to connective tissue disease (HCC)  J84.89    M35.9     2. Interstitial lung disease with progressive fibrotic phenotype in diseases classified elsewhere (HCC)  J84.170     3. High risk medication use  Z79.899     4. Encounter for therapeutic drug monitoring  Z51.81     5. On Cellcept  therapy  Z79.624         #ILD  Based on the pulmonary function test there might be very slow progression of interstitial lung disease slowly over time over many years. Some concern this progression is ongoing JAn 2026 PFT  Blood labs Dec 2025 normal but for mild anemia  Plan - 3 months to spirometry and DLCO - 3 months do high-resolution CT chest - Continue CellCept  500 mg twice daily - START JASCAYD - get safety labs at next visit    Phentermine exposure x 3 months  -  Chart review shows you stopped Phentermine  Anemia  Plan  - monitor  Plan -please reconfrim with CMA that you did stop it  Follow-up - 3 months with Dr. Geronimo 15-minute visit; ILD symptom score at follow-up (also get PFT and CT) "

## 2024-02-06 NOTE — Progress Notes (Unsigned)
 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   Patient presented with worsening dyspnea and chest pain for 3 days.  She also endorses acute worsening of her chronic cough for the past 4 days.  No fevers.  Endorses brief rhinorrhea, some congestion.  Endorses fatigue for the past few days, worse than normal.  Denies leg swelling.   Patient was using daily Symbicort  maintenance inhaler.  The patient had 4 severe shortness of breath episodes requiring albuterol  inhaler use in the past 3 days.  The patient states she would use albuterol  inhaler for 2 puffs, wait 4 hours, then use it again and add DuoNeb treatment if still dyspneic.  She states this treatment helped mildly and temporarily, but dyspnea kept returning, which brought her to [facility] yesterday where ED evaluation was recommended.   Patient endorses consistently using her maintenance inhaler and immunosuppressive regimen, she is not missing any doses.   Patient states that at baseline she has no respiratory distress or dyspnea at rest.  With exertion (i.e. walking up steps), the patient experiences moderate dyspnea at baseline.  Patient has a chronic dry cough which has been going on for years.   RPP 9/16 positive for rhino/enterovirus, s/p 1 dose Solumedrol 125 mg 9/17 - CTA chest slight interval worsening of interstitial fibrosis since 2022.  Chest pain has improved since admission with DuoNeb treatments.  Patient endorses that chest pain occurs only after coughing spells, which occur when she exerts herself.  She states that breathing has improved after breathing treatments.  Scleroderma Interstitial fibrosis - Possible interval progression of fibrosis from prior CT 2022. - s/p 1 dose SoluMedrol   Plan  - repeat spiro and dlco -> see DR geronimo in ILD clinic (message sent)  -  will need anti-fibrotic +/- clinical trial     Enlarged pulmonary artery on CT scan   - no evidence of PAH on ECHO or BNP   Plan - track PFTs and symptoms in opd ILD clinic   Rhinovirus URI Dyspnea, cough   - cough improved  - no focal abnormalities   Plan    OV 11/07/2023  Subjective:  Patient ID: Laura Rowland, female , DOB: Mar 09, 1975 , age 71 y.o. , MRN: [id number] , ADDRESS: [address] [name] PCP Cleotilde Planas, MD Patient Care Team: Cleotilde Planas, MD as PCP - General (Family Medicine)  This Provider for this visit: Treatment Team:  Attending Provider: Geronimo Amel, MD    11/07/2023 -   Chief Complaint  Patient presents with   Medical Management of Chronic Issues   Interstitial Lung Disease    Breathing is at baseline. She has some non prod cough.        HPI Laura Rowland 49 y.o. -returns for posthospital follow-up.  She quickly recovered from a rhinovirus infection.  In the hospital did a blood BNP and echocardiogram and this suggested low risk of pulmonary hypertension.  Currently feels back to baseline.  Symptom scores are below.  She continues her CellCept  500 mg twice daily med review shows that she is on phentermine for weight loss.  Has been taking this for 3 months.  She is unaware that this might be associated pulmonary hypertension risk.  She had spirometry and DLCO which is somewhat stable compared to recent times but definitely  progressive over some years.  The CT angiogram in the hospital suggested worsening pulmonary process over the years with contrast CT.  Discussed the new approval of Nerandomilast for progressive phenotype of pulmonary fibrosis she is interested in this.  Explained the GI side effects.  Will see her in 3 months with a spirometry and DLCO and CT scan and then decide if it is indicated  Also regarding pulmonary hypertension she is already at risk because of scleroderma.  Therefore I told her to stop her  phentermine.  She is up-to-date with her flu shot  OV 02/07/2024  Subjective:  Patient ID: Laura Rowland, female , DOB: 10-05-1975 , age 54 y.o. , MRN: [id number] , ADDRESS: [address] [place] [address] PCP Cleotilde Planas, MD Patient Care Team: Cleotilde Planas, MD as PCP - General (Family Medicine)  This Provider for this visit: Treatment Team:  Attending Provider: Geronimo Amel, MD  # Interstitial lung disease due to scleroderma  - #CellCept  500 mg twice daily  # At risk for pulmonary hypertension [scleroderma and phentermine as of October 2025]  - Normal echo and BNP September 2025  #Hospitalization for rhinovirus September 2025  02/07/2024 -   Chief Complaint  Patient presents with   Interstitial Lung Disease    Pt states since LOV SOB has been occurring w/ exertion but stated this is pretty normal for her Dry chronic cough occurs     HPI Laura Rowland 48 y.o. -Laura Rowland is a 49 year old female with scleroderma and interstitial lung disease who presents with shortness of breath on exertion.  She experiences shortness of breath on exertion, which is more pronounced than her previous baseline before a recent cold RV admit sept 2025), although it is better than during the acute phase of the illness. She describes her current condition as a 'new lower baseline' compared to her previous state.  In December 2025 she had an infection in her left index finger, which required hospitalization. She was treated with antibiotics and pain medication, but no surgery was performed.  She is currently taking CellCept  500 mg twice a day for her condition. LAbs Dec 2025 fine but for anemia mild  She recalls a significant health event in 2017 when she had a baby and subsequently developed pulmonary edema, which severely impacted her breathing for a prolonged period. She relates this experience to a significant drop in her lung function at that time.  She reports that her  lung function tests over the past decade have shown a decrease in lung capacity, with values ranging from 3.3 liters to 2.68 liters.  No new problems aside from her breathing issues.  10-11 year review o PFT shows steady decline in PFT with sharp decline 20118/2019 when she had baby and then slow between 2022 > now  SYMPTOM SCALE - ILD 11/07/2023  Current weight   O2 use ra  Shortness of Breath 0 -> 5 scale with 5 being worst (score 6 If unable to do)  At rest 0  Simple tasks - showers, clothes change, eating, shaving 1  Household (dishes, doing bed, laundry) 2  Shopping 2  Walking level at own pace 2  Walking up Stairs 3  Total (30-36) Dyspnea Score 12  How bad is your cough? 1  How bad is your fatigue 3  How bad is appetiee 3  How bad is nausea 1  How bad is vomiting?  1  How bad is diarrhea? 0  How bad  is anxiety? 0  How bad is depression 0  Any chronic pain - if so where and how bad 0    CT Chest data from date: SEPT 20-25  - personally visualized and independently interpreted : yes - my findings are: as below  IMPRESSION: No evidence of pulmonary embolism.   Some findings suggestive of pulmonary arterial hypertension are noted.   Diffuse fibrotic changes particularly in the bases bilaterally. Associated nodularity is seen which is slightly increased in the interval from 2022 felt to be progression of scleroderma related fibrosis and nodular change. Given the increase in size from the prior exam, follow-up examination in 6 months is recommended to assess for stability.   Cholelithiasis     Electronically Signed   By: Oneil Devonshire M.D.   On: 10/08/2023 19:44    PFT     Latest Ref Rng & Units 02/07/2024   10:16 AM 10/09/2023   11:35 AM 09/27/2022    2:28 PM 03/22/2021    3:05 PM 04/26/2020    3:23 PM 11/18/2019   10:05 AM 03/12/2019    8:30 AM  PFT Results  FVC-Pre L 2.68  P 2.87  2.70  2.86  2.86  2.88  2.78   FVC-Predicted Pre % 57  P 61  57  71  71  71   69   FVC-Post L      2.88  2.82   FVC-Predicted Post %      71  70   Pre FEV1/FVC % % 69  P 65  70  71  68  70  74   Post FEV1/FCV % %      75  74   FEV1-Pre L 1.85  P 1.86  1.90  2.02  1.95  2.01  2.05   FEV1-Predicted Pre % 49  P 50  50  62  59  61  62   FEV1-Post L      2.16  2.10   DLCO uncorrected ml/min/mmHg 13.11  P 12.99  13.40  12.96  14.87  16.64  15.05   DLCO UNC% % 47  P 47  48  46  53  59  53   DLCO corrected ml/min/mmHg 13.90  P     16.90    DLCO COR %Predicted % 50  P     60    DLVA Predicted % 81  P 77  78  77  82  90  90   TLC L   4.73  4.82   4.91  5.16   TLC % Predicted %   75  77   78  82   RV % Predicted %   87  92   96  113     P Preliminary result       LAB RESULTS last 96 hours No results found.       has a past medical history of Anemia and Scleredema (HCC).   reports that she has never smoked. She has never used smokeless tobacco.  Past Surgical History:  Procedure Laterality Date   BREAST BIOPSY Right    BREAST CYST EXCISION Left 1996   x2   CESAREAN SECTION     CESAREAN SECTION N/A 12/21/2015   Procedure: CESAREAN SECTION;  Surgeon: Rexene Hoit, MD;  Location: [facility]IRTHING SU[facility];  Service: Obstetrics;  Laterality: N/A;   CYST REMOVAL HAND  2008   DILATION AND EVACUATION N/A 06/28/2014   Procedure: DILATATION AND EVACUATION;  Surgeon: Rexene Hoit, MD;  Location: [facility] ORS;  Service: Gynecology;  Laterality: N/A;   WISDOM TOOTH EXTRACTION      Allergies[1]  Immunization History  Administered Date(s) Administered   Fluzone  Influenza virus vaccine,trivalent (IIV3), split virus 10/19/2013, 11/22/2016   Influenza Inj Mdck Quad Pf 10/03/2017   Influenza Split 10/27/2009   Influenza, Seasonal, Injecte, Preservative Fre 03/19/2023, 10/14/2023   Influenza,inj,Quad PF,6+ Mos 10/10/2010, 10/31/2015, 11/15/2015, 10/03/2018   Influenza,inj,quad, With Preservative 12/13/2014   Influenza-Unspecified 10/16/2012, 11/23/2014, 10/31/2015, 11/05/2016   Moderna  Sars-Covid-2 Vaccination 03/06/2019, 04/04/2019   PNEUMOCOCCAL CONJUGATE-20 09/07/2021   Pneumococcal Polysaccharide-23 11/04/2013, 04/04/2017   Tdap 10/10/2010, 11/15/2015    Family History  Problem Relation Age of Onset   Diabetes Father    Breast cancer Maternal Aunt     Current Medications[2]      Objective:   Vitals:   02/07/24 1056  BP: 136/80  Pulse: 75  Temp: 98 F (36.7 C)  TempSrc: Oral  SpO2: 98%  Weight: 240 lb 6.4 oz (109 kg)  Height: 6' (1.829 m)    Estimated body mass index is 32.6 kg/m as calculated from the following:   Height as of this encounter: 6' (1.829 m).   Weight as of this encounter: 240 lb 6.4 oz (109 kg).  @WEIGHTCHANGE @  Filed Weights   02/07/24 1056  Weight: 240 lb 6.4 oz (109 kg)     Physical Exam   General: No distress. Looks well O2 at rest: no Cane present: no Sitting in wheel chair: no Frail: no Obese: no Neuro: Alert and Oriented x 3. GCS 15. Speech normal Psych: Pleasant Resp:  Barrel Chest - no.  Wheeze - no, Crackles - YES BASE, No overt respiratory distress CVS: Normal heart sounds. Murmurs - no Ext: Stigmata of Connective Tissue Disease - no HEENT: Normal upper airway. PEERL +. No post nasal drip        Assessment/     Assessment & Plan Interstitial lung disease due to connective tissue disease (HCC)  Interstitial lung disease with progressive fibrotic phenotype in diseases classified elsewhere [facility])  High risk medication use  Encounter for therapeutic drug monitoring  On Cellcept  therapy    PLAN Patient Instructions     ICD-10-CM   1. Interstitial lung disease due to connective tissue disease (HCC)  J84.89    M35.9     2. Interstitial lung disease with progressive fibrotic phenotype in diseases classified elsewhere (HCC)  J84.170     3. High risk medication use  Z79.899     4. Encounter for therapeutic drug monitoring  Z51.81     5. On Cellcept  therapy  Z79.624          #ILD  Based on the pulmonary function test there might be very slow progression of interstitial lung disease slowly over time over many years. Some concern this progression is ongoing JAn 2026 PFT  Blood labs Dec 2025 normal but for mild anemia  Plan - 3 months to spirometry and DLCO - 3 months do high-resolution CT chest - Continue CellCept  500 mg twice daily - START JASCAYD - get safety labs at next visit    Phentermine exposure x 3 months  -  Chart review shows you stopped Phentermine  Anemia  Plan  - monitor  Plan -please reconfrim with CMA that you did stop it  Follow-up - 3 months with Dr. Geronimo 15-minute visit; ILD symptom score at follow-up (also get PFT and CT)    FOLLOWUP  Return for - 3 months with Dr. Geronimo 15-minute visit; ILD symptom score at follow-up (also get PFT and CT).    SIGNATURE    Dr. Dorethia Geronimo, M.D., F.C.C.P,  Pulmonary and Critical Care Medicine Staff Physician, [facility] Center Director - Interstitial Lung Disease  Program  Pulmonary Fibrosis [facility] at [facility] [place], [place], [address]  Pager: [phone], If no answer or between  15:00h - 7:00h: call [phone] Telephone: [phone]  11:27 AM 02/07/2024     [1] No Known Allergies [2]  Current Outpatient Medications:    albuterol  (VENTOLIN  HFA) 108 (90 Base) MCG/ACT inhaler, Inhale 2 puffs into the lungs every 6 (six) hours as needed for wheezing or shortness of breath., Disp: 8 g, Rfl: 2   budesonide -formoterol  (SYMBICORT ) 160-4.5 MCG/ACT inhaler, Inhale 2 puffs into the lungs 2 (two) times daily., Disp: 1 Inhaler, Rfl: 0   cetirizine (ZYRTEC ALLERGY) 10 MG tablet, Take 10 mg by mouth daily., Disp: , Rfl:    Cholecalciferol (VITAMIN D) 2000 UNITS tablet, Take 2,000 Units by mouth every evening., Disp: , Rfl:    esomeprazole  (NEXIUM ) 40 MG capsule, Take 30- 60 min before your first and last meals of the  day, Disp: , Rfl:    hydroxychloroquine  (PLAQUENIL ) 200 MG tablet, Take 200 mg by mouth 2 (two) times daily., Disp: , Rfl:    ibuprofen  (ADVIL ,MOTRIN ) 800 MG tablet, Take 800 mg by mouth every 8 (eight) hours as needed., Disp: , Rfl:    ipratropium-albuterol  (DUONEB) 0.5-2.5 (3) MG/3ML SOLN, Take 3 mLs by nebulization every 4 (four) hours as needed., Disp: 120 mL, Rfl: 2   mycophenolate  (CELLCEPT ) 500 MG tablet, Take 500 mg by mouth 2 (two) times daily., Disp: , Rfl:    Prenatal 27-1 MG TABS, Take 1 tablet by mouth daily., Disp: , Rfl:    valACYclovir  (VALTREX ) 500 MG tablet, Take 500 mg by mouth daily., Disp: , Rfl:    pentoxifylline  (TRENTAL ) 400 MG CR tablet, Take 400 mg by mouth 2 (two) times daily. (Patient not taking: Reported on 02/07/2024), Disp: , Rfl:    prazosin  (MINIPRESS ) 1 MG capsule, Take 1 mg by mouth 2 (two) times daily. (Patient not taking: Reported on 02/07/2024), Disp: , Rfl:   "

## 2024-02-07 ENCOUNTER — Ambulatory Visit

## 2024-02-07 ENCOUNTER — Telehealth: Payer: Self-pay | Admitting: Internal Medicine

## 2024-02-07 ENCOUNTER — Encounter: Payer: Self-pay | Admitting: Internal Medicine

## 2024-02-07 ENCOUNTER — Ambulatory Visit (INDEPENDENT_AMBULATORY_CARE_PROVIDER_SITE_OTHER): Admitting: Internal Medicine

## 2024-02-07 VITALS — BP 136/80 | HR 75 | Temp 98.0°F | Ht 72.0 in | Wt 240.4 lb

## 2024-02-07 DIAGNOSIS — J8489 Other specified interstitial pulmonary diseases: Secondary | ICD-10-CM | POA: Diagnosis not present

## 2024-02-07 DIAGNOSIS — M359 Systemic involvement of connective tissue, unspecified: Secondary | ICD-10-CM | POA: Diagnosis not present

## 2024-02-07 DIAGNOSIS — D649 Anemia, unspecified: Secondary | ICD-10-CM

## 2024-02-07 DIAGNOSIS — Z5181 Encounter for therapeutic drug level monitoring: Secondary | ICD-10-CM | POA: Diagnosis not present

## 2024-02-07 DIAGNOSIS — Z79624 Long term (current) use of inhibitors of nucleotide synthesis: Secondary | ICD-10-CM

## 2024-02-07 DIAGNOSIS — J849 Interstitial pulmonary disease, unspecified: Secondary | ICD-10-CM | POA: Diagnosis not present

## 2024-02-07 DIAGNOSIS — J8417 Interstitial lung disease with progressive fibrotic phenotype in diseases classified elsewhere: Secondary | ICD-10-CM

## 2024-02-07 DIAGNOSIS — Z79899 Other long term (current) drug therapy: Secondary | ICD-10-CM

## 2024-02-07 LAB — PULMONARY FUNCTION TEST
DL/VA % pred: 81 %
DL/VA: 3.37 ml/min/mmHg/L
DLCO cor % pred: 50 %
DLCO cor: 13.9 ml/min/mmHg
DLCO unc % pred: 47 %
DLCO unc: 13.11 ml/min/mmHg
FEF 25-75 Pre: 1.11 L/s
FEF2575-%Pred-Pre: 32 %
FEV1-%Pred-Pre: 49 %
FEV1-Pre: 1.85 L
FEV1FVC-%Pred-Pre: 85 %
FEV6-%Pred-Pre: 58 %
FEV6-Pre: 2.65 L
FEV6FVC-%Pred-Pre: 101 %
FVC-%Pred-Pre: 57 %
FVC-Pre: 2.68 L
Pre FEV1/FVC ratio: 69 %
Pre FEV6/FVC Ratio: 99 %

## 2024-02-07 NOTE — Telephone Encounter (Signed)
 PT requested cone for CT scan (please please order for cone)

## 2024-02-07 NOTE — Addendum Note (Signed)
 Addended by: Joei Frangos M on: 02/07/2024 11:47 AM   Modules accepted: Orders

## 2024-02-07 NOTE — Progress Notes (Signed)
Spiro/DLCO performed today. 

## 2024-02-07 NOTE — Patient Instructions (Signed)
Spiro/DLCO performed today. 

## 2024-02-07 NOTE — Telephone Encounter (Signed)
 OK, I changed order to have the CT done at [facility].

## 2024-02-11 ENCOUNTER — Telehealth: Payer: Self-pay

## 2024-02-11 ENCOUNTER — Other Ambulatory Visit: Payer: Self-pay | Admitting: Medical Genetics

## 2024-02-11 DIAGNOSIS — Z006 Encounter for examination for normal comparison and control in clinical research program: Secondary | ICD-10-CM

## 2024-02-11 NOTE — Telephone Encounter (Signed)
 Received Jascayd new start paperwork. Opening benefits investigation in this thread, updates to follow.

## 2024-02-12 ENCOUNTER — Encounter: Payer: Self-pay | Admitting: Internal Medicine

## 2024-02-12 NOTE — Telephone Encounter (Signed)
 Submitted a Prior Authorization request to [facility] MEDICARE for JASCAYD via CoverMyMeds. Will update once we receive a response.  Key: [id number]

## 2024-02-13 NOTE — Telephone Encounter (Signed)
 Please advise.

## 2024-02-13 NOTE — Telephone Encounter (Signed)
 Received fax with additional clinical questions. Completed questions and faxed back to [facility].  Fax #: [phone] Case #: [id number]

## 2024-02-17 ENCOUNTER — Other Ambulatory Visit (HOSPITAL_COMMUNITY): Payer: Self-pay

## 2024-02-17 NOTE — Telephone Encounter (Signed)
 Received notification from [facility] MEDICARE regarding a prior authorization for JASCAYD . Authorization has been APPROVED from 02/13/24 to 01/21/25. Approval letter sent to scan center.  Per test claim, copay for 30 days supply is $12.65  Patient can fill through [facility]: [phone]   Authorization # [id number] Phone # [phone]

## 2024-02-19 ENCOUNTER — Ambulatory Visit: Attending: Internal Medicine

## 2024-02-19 ENCOUNTER — Telehealth: Payer: Self-pay | Admitting: Podiatry

## 2024-02-19 ENCOUNTER — Telehealth: Payer: Self-pay | Admitting: Internal Medicine

## 2024-02-19 DIAGNOSIS — J849 Interstitial pulmonary disease, unspecified: Secondary | ICD-10-CM

## 2024-02-19 MED ORDER — JASCAYD 18 MG PO TABS: 18.0000 mg | ORAL_TABLET | Freq: Two times a day (BID) | ORAL | 5 refills | Status: AC

## 2024-02-19 NOTE — Telephone Encounter (Signed)
 See pharmacotherapy visit 02/19/24. Jascayd  initial counseling complete. Rx triaged to [facility].

## 2024-02-19 NOTE — Telephone Encounter (Signed)
 Copied from CRM #[id number]. Topic: Referral - Question >> Feb 19, 2024  1:03 PM Leila C wrote: Reason for CRM: Patient [id number] states insurance company needs a referral from primary care to see Dr. Geronimo. Patient states [facility] family medicine Dr. Olam Couch office [phone] advised to contact our office to get ICD 10 code for each doctors office to process the referral. Could not hear patient, call may have been dropped. Called patient back. Please advise and call back.

## 2024-02-19 NOTE — Telephone Encounter (Signed)
 Reached out to the patient because they had scheduled an appointment in MyChart but it was as a New Patient & Ms. Gregg was seen by Dr. GEANNIE Ovens in 2022 and I wanted to schedule it for an appropriate time slot. (The appointment was corrected)  While in the call the patient inquired about an ICD 10 code for a referral that is now required by their insurance ([employer]) to see a specialist. Advised: that is something they need to contact their PCP about. Iof that is something their insurance is requiring, then they need to have their PCP send over a referral with the appropriate codes by their scheduled appt date with Dr. Ovens on 03/12/24 to be able to be seen.  *The patient disconnected the call.

## 2024-02-19 NOTE — Telephone Encounter (Signed)
 Copied from CRM [id number]. Topic: Medical Record Request - Other >> Feb 19, 2024  1:08 PM Leila C wrote: Reason for CRM: Patient [phone] submitted a form a week 02/12/24 in Mychart for Dr. Geronimo: Patient [employer] for newly diagnosed Scleroderma and ILD patients. The program that I am working with needs to verify that I actually have SSC-ILD and am currently under a doctor's care. I have attached the Physician's disclosure form. Can you please fill it out for me? Doing so will allow me to continue to serve with them for the 2026 calendar year. Can you please include the latest lung function test results? Patient has not heard back from the office.  Please advise and call back.

## 2024-02-19 NOTE — Progress Notes (Signed)
 [facility]  Referring Provider: Dr. Geronimo  Virtual Visit via Telephone Note  I connected with Laura Rowland on 02/19/24 at  2:00 PM EST by telephone and verified that I am speaking with the correct person using two identifiers.  Location: Patient: home Provider: office   I discussed the limitations, risks, security and privacy concerns of performing an evaluation and management service by telephone and the availability of in person appointments. I also discussed with the patient that there may be a patient responsible charge related to this service. The patient expressed understanding and agreed to proceed.   Subjective:  Patient presents via telephone today to [facility] team for Jascayd  new start counseling. She was referred to pharmacy team for Jascayd  at time of last visit with Dr. Geronimo on 02/07/24. PMH includes scleroderma, pulmonary fibrosis, and immunocompromised status on CellCept  and Plaquenil . Progression of ILD noted based on pulmonary function tests. Plan to start Jascayd  and continue CellCept .  Currently taking antifibrotic: no  Objective: Allergies[1]  Outpatient Encounter Medications as of 02/19/2024  Medication Sig   nerandomilast  (JASCAYD ) 18 MG tablet Take 18 mg by mouth 2 (two) times daily.   albuterol  (VENTOLIN  HFA) 108 (90 Base) MCG/ACT inhaler Inhale 2 puffs into the lungs every 6 (six) hours as needed for wheezing or shortness of breath.   budesonide -formoterol  (SYMBICORT ) 160-4.5 MCG/ACT inhaler Inhale 2 puffs into the lungs 2 (two) times daily.   cetirizine (ZYRTEC ALLERGY) 10 MG tablet Take 10 mg by mouth daily.   Cholecalciferol (VITAMIN D) 2000 UNITS tablet Take 2,000 Units by mouth every evening.   esomeprazole  (NEXIUM ) 40 MG capsule Take 30- 60 min before your first and last meals of the day   hydroxychloroquine  (PLAQUENIL ) 200 MG tablet Take 200 mg by mouth 2 (two) times daily.   ibuprofen   (ADVIL ,MOTRIN ) 800 MG tablet Take 800 mg by mouth every 8 (eight) hours as needed.   ipratropium-albuterol  (DUONEB) 0.5-2.5 (3) MG/3ML SOLN Take 3 mLs by nebulization every 4 (four) hours as needed.   mycophenolate  (CELLCEPT ) 500 MG tablet Take 500 mg by mouth 2 (two) times daily.   pentoxifylline  (TRENTAL ) 400 MG CR tablet Take 400 mg by mouth 2 (two) times daily. (Patient not taking: Reported on 02/07/2024)   prazosin  (MINIPRESS ) 1 MG capsule Take 1 mg by mouth 2 (two) times daily. (Patient not taking: Reported on 02/07/2024)   Prenatal 27-1 MG TABS Take 1 tablet by mouth daily.   valACYclovir  (VALTREX ) 500 MG tablet Take 500 mg by mouth daily.   No facility-administered encounter medications on file as of 02/19/2024.     Immunization History  Administered Date(s) Administered   Fluzone  Influenza virus vaccine,trivalent (IIV3), split virus 10/19/2013, 11/22/2016   Influenza Inj Mdck Quad Pf 10/03/2017   Influenza Split 10/27/2009   Influenza, Seasonal, Injecte, Preservative Fre 03/19/2023, 10/14/2023   Influenza,inj,Quad PF,6+ Mos 10/10/2010, 10/31/2015, 11/15/2015, 10/03/2018   Influenza,inj,quad, With Preservative 12/13/2014   Influenza-Unspecified 10/16/2012, 11/23/2014, 10/31/2015, 11/05/2016   Moderna Sars-Covid-2 Vaccination 03/06/2019, 04/04/2019   PNEUMOCOCCAL CONJUGATE-20 09/07/2021   Pneumococcal Polysaccharide-23 11/04/2013, 04/04/2017   Tdap 10/10/2010, 11/15/2015      PFT's TLC  Date Value Ref Range Status  09/27/2022 4.73 L Final      CMP     Component Value Date/Time   NA 137 01/22/2024 0742   K 3.9 01/22/2024 0742   CL 105 01/22/2024 0742   CO2 24 01/22/2024 0742   GLUCOSE 100 (H) 01/22/2024 0742   BUN  9 01/22/2024 0742   CREATININE 0.69 01/22/2024 0742   CALCIUM  8.6 (L) 01/22/2024 0742   PROT 7.5 01/21/2024 0652   ALBUMIN 3.9 01/21/2024 0652   AST 27 01/21/2024 0652   ALT 10 01/21/2024 0652   ALKPHOS 80 01/21/2024 0652   BILITOT 0.4 01/21/2024 0652    GFRNONAA >60 01/22/2024 0742   GFRAA >60 01/07/2016 0448      CBC    Component Value Date/Time   WBC 4.2 01/22/2024 0742   RBC 4.76 01/22/2024 0742   HGB 11.7 (L) 01/22/2024 0742   HCT 37.4 01/22/2024 0742   PLT 202 01/22/2024 0742   MCV 78.6 (L) 01/22/2024 0742   MCH 24.6 (L) 01/22/2024 0742   MCHC 31.3 01/22/2024 0742   RDW 13.2 01/22/2024 0742   LYMPHSABS 1.7 01/21/2024 0652   MONOABS 0.4 01/21/2024 0652   EOSABS 0.1 01/21/2024 0652   BASOSABS 0.0 01/21/2024 0652      LFT's    Latest Ref Rng & Units 01/21/2024    6:52 AM 10/09/2023   12:21 AM 10/08/2023   11:23 AM  Hepatic Function  Total Protein 6.5 - 8.1 g/dL 7.5  7.6  7.1   Albumin 3.5 - 5.0 g/dL 3.9  3.6  3.4   AST 15 - 41 U/L 27  21  22    ALT 0 - 44 U/L 10  12  13    Alk Phosphatase 38 - 126 U/L 80  63  52   Total Bilirubin 0.0 - 1.2 mg/dL 0.4  0.5  <9.7       HRCT - ordered by Dr. Geronimo for 3 months  Assessment and Plan  Jascayd  Medication Management Thoroughly counseled patient on the efficacy, mechanism of action, dosing, administration, adverse effects, and monitoring parameters of Jascayd .  Patient verbalized understanding.   Goals of Therapy: Will not stop or reverse the progression of ILD. It will slow the progression of ILD.   Dosing: Recommended dose will be 18mg  tablet, Take 1 tablet twice daily. May be administered with or without regard to food.   Adverse Effects: Weight loss (nerandomilast  monotherapy: 8%) Decreased appetite (nerandomilast  monotherapy: 6% to 9%) Diarrhea (nerandomilast  monotherapy: 17% to 26%)  Monitoring: Monitor for diarrhea, decreased appetite, weight loss  Drug interactions: nerandomilast  is a major substrate of CYP3A4. Avoid use of moderate and strong CYP3A4 inducers or inhibitors.  No documented use of moderate and strong CYP3A4 inducers or inhibitors  Access: Approval of Jascayd  through: insurance Rx sent to: [facility]: [phone]    Medication Reconciliation A drug regimen assessment was performed, including review of allergies, interactions, disease-state management, dosing and immunization history. Medications were reviewed with the patient, including name, instructions, indication, goals of therapy, potential side effects, importance of adherence, and safe use.  PLAN:  - START Jascayd  18mg  tablet, Take 1 tablet by mouth twice daily. - CONTINUE other medications as prescribed. Jascayd  does NOT replace your other medications. - Follow-up with Dr. Geronimo as planned on 05/07/24. - Call [facility] clinic if you experience intolerable side effects.   Thank you for involving pharmacy to assist in providing this patient's care.   I discussed the assessment and treatment plan with the patient. The patient was provided an opportunity to ask questions and all were answered. The patient agreed with the plan and demonstrated an understanding of the instructions.   The patient was advised to call back or seek an in-person evaluation if the symptoms worsen or if the condition fails to improve  as anticipated.  I provided 10 minutes of non-face-to-face time during this encounter.  Aleck Puls, PharmD, BCPS, CPP Clinical Pharmacist  [facility]     [1] No Known Allergies

## 2024-02-21 ENCOUNTER — Telehealth: Payer: Self-pay

## 2024-02-21 NOTE — Telephone Encounter (Signed)
 Spoke with patient VBU, paperwork ready to be picked up at front desk

## 2024-02-21 NOTE — Telephone Encounter (Signed)
 Done  Please print the note below for PFT range       Latest Ref Rng & Units 02/07/2024   10:16 AM 10/09/2023   11:35 AM 09/27/2022    2:28 PM 03/22/2021    3:05 PM 04/26/2020    3:23 PM 11/18/2019   10:05 AM 03/12/2019    8:30 AM  PFT Results  FVC-Pre L 2.68  2.87  2.70  2.86  2.86  2.88  2.78   FVC-Predicted Pre % 57  61  57  71  71  71  69   FVC-Post L      2.88  2.82   FVC-Predicted Post %      71  70   Pre FEV1/FVC % % 69  65  70  71  68  70  74   Post FEV1/FCV % %      75  74   FEV1-Pre L 1.85  1.86  1.90  2.02  1.95  2.01  2.05   FEV1-Predicted Pre % 49  50  50  62  59  61  62   FEV1-Post L      2.16  2.10   DLCO uncorrected ml/min/mmHg 13.11  12.99  13.40  12.96  14.87  16.64  15.05   DLCO UNC% % 47  47  48  46  53  59  53   DLCO corrected ml/min/mmHg 13.90      16.90    DLCO COR %Predicted % 50      60    DLVA Predicted % 81  77  78  77  82  90  90   TLC L   4.73  4.82   4.91  5.16   TLC % Predicted %   75  77   78  82   RV % Predicted %   87  92   96  113

## 2024-02-21 NOTE — Telephone Encounter (Signed)
 This is a duplicate encounter.

## 2024-02-21 NOTE — Telephone Encounter (Signed)
 NFN

## 2024-03-12 ENCOUNTER — Ambulatory Visit: Admitting: Podiatry

## 2024-05-07 ENCOUNTER — Ambulatory Visit: Admitting: Internal Medicine

## 2024-05-07 ENCOUNTER — Encounter

## 2024-08-17 ENCOUNTER — Ambulatory Visit: Admitting: Physician Assistant
